# Patient Record
Sex: Female | Born: 1975 | Race: Black or African American | Hispanic: No | Marital: Single | Smoking: Current every day smoker
Health system: Southern US, Community
[De-identification: ages and names within clinical notes are randomized; demographics above are authoritative.]

## PROBLEM LIST (undated history)

## (undated) ENCOUNTER — Emergency Department (HOSPITAL_BASED_OUTPATIENT_CLINIC_OR_DEPARTMENT_OTHER): Admission: EM | Payer: BC Managed Care – PPO | Source: Home / Self Care

## (undated) DIAGNOSIS — I639 Cerebral infarction, unspecified: Secondary | ICD-10-CM

## (undated) DIAGNOSIS — R569 Unspecified convulsions: Secondary | ICD-10-CM

## (undated) DIAGNOSIS — R55 Syncope and collapse: Secondary | ICD-10-CM

## (undated) HISTORY — PX: APPENDECTOMY: SHX54

---

## 2010-02-26 ENCOUNTER — Emergency Department (HOSPITAL_COMMUNITY): Admission: EM | Admit: 2010-02-26 | Discharge: 2010-02-27 | Payer: Self-pay | Admitting: Emergency Medicine

## 2010-02-27 ENCOUNTER — Observation Stay (HOSPITAL_COMMUNITY): Admission: EM | Admit: 2010-02-27 | Discharge: 2010-02-28 | Payer: Self-pay | Admitting: Emergency Medicine

## 2010-02-28 ENCOUNTER — Encounter (INDEPENDENT_AMBULATORY_CARE_PROVIDER_SITE_OTHER): Payer: Self-pay | Admitting: General Surgery

## 2010-08-29 LAB — DIFFERENTIAL
Basophils Absolute: 0 K/uL (ref 0.0–0.1)
Basophils Relative: 0 % (ref 0–1)
Eosinophils Absolute: 0 K/uL (ref 0.0–0.7)
Eosinophils Relative: 0 % (ref 0–5)
Lymphocytes Relative: 21 % (ref 12–46)
Lymphs Abs: 2.5 K/uL (ref 0.7–4.0)
Monocytes Absolute: 1.1 K/uL — ABNORMAL HIGH (ref 0.1–1.0)
Monocytes Relative: 10 % (ref 3–12)
Neutro Abs: 7.9 K/uL — ABNORMAL HIGH (ref 1.7–7.7)
Neutrophils Relative %: 68 % (ref 43–77)

## 2010-08-29 LAB — CBC
HCT: 40.7 % (ref 36.0–46.0)
Hemoglobin: 14 g/dL (ref 12.0–15.0)
MCH: 29.9 pg (ref 26.0–34.0)
MCHC: 34.4 g/dL (ref 30.0–36.0)
MCV: 87 fL (ref 78.0–100.0)
Platelets: 380 K/uL (ref 150–400)
RBC: 4.68 MIL/uL (ref 3.87–5.11)
RDW: 15.2 % (ref 11.5–15.5)
WBC: 11.5 K/uL — ABNORMAL HIGH (ref 4.0–10.5)

## 2010-08-29 LAB — URINALYSIS, ROUTINE W REFLEX MICROSCOPIC
Bilirubin Urine: NEGATIVE
Glucose, UA: NEGATIVE mg/dL
Ketones, ur: NEGATIVE mg/dL
Ketones, ur: NEGATIVE mg/dL
Leukocytes, UA: NEGATIVE
Nitrite: NEGATIVE
Nitrite: NEGATIVE
Protein, ur: NEGATIVE mg/dL
Specific Gravity, Urine: 1.045 — ABNORMAL HIGH (ref 1.005–1.030)
Urobilinogen, UA: 0.2 mg/dL (ref 0.0–1.0)
pH: 6 (ref 5.0–8.0)
pH: 7 (ref 5.0–8.0)

## 2010-08-29 LAB — URINE MICROSCOPIC-ADD ON

## 2010-08-29 LAB — LIPASE, BLOOD: Lipase: 26 U/L (ref 11–59)

## 2010-08-29 LAB — BASIC METABOLIC PANEL
Calcium: 9.1 mg/dL (ref 8.4–10.5)
GFR calc Af Amer: >60 mL/min (ref >60)
GFR calc non Af Amer: >60 mL/min (ref >60)
Glucose, Bld: 80 mg/dL (ref 70–99)
Potassium: 3.5 mEq/L (ref 3.5–5.1)
Sodium: 136 mEq/L (ref 135–145)

## 2010-08-29 LAB — POCT PREGNANCY, URINE
Preg Test, Ur: NEGATIVE
Preg Test, Ur: NEGATIVE

## 2011-06-18 ENCOUNTER — Emergency Department (HOSPITAL_COMMUNITY): Payer: Medicaid Other

## 2011-06-18 ENCOUNTER — Encounter: Payer: Self-pay | Admitting: *Deleted

## 2011-06-18 ENCOUNTER — Other Ambulatory Visit: Payer: Self-pay

## 2011-06-18 ENCOUNTER — Emergency Department (HOSPITAL_COMMUNITY)
Admission: EM | Admit: 2011-06-18 | Discharge: 2011-06-18 | Disposition: A | Discharge status: Home / Self Care | Payer: Medicaid Other | Attending: Emergency Medicine | Admitting: Emergency Medicine

## 2011-06-18 DIAGNOSIS — R002 Palpitations: Secondary | ICD-10-CM | POA: Insufficient documentation

## 2011-06-18 DIAGNOSIS — R05 Cough: Secondary | ICD-10-CM | POA: Insufficient documentation

## 2011-06-18 DIAGNOSIS — R059 Cough, unspecified: Secondary | ICD-10-CM | POA: Insufficient documentation

## 2011-06-18 DIAGNOSIS — F17200 Nicotine dependence, unspecified, uncomplicated: Secondary | ICD-10-CM | POA: Insufficient documentation

## 2011-06-18 DIAGNOSIS — F419 Anxiety disorder, unspecified: Secondary | ICD-10-CM

## 2011-06-18 DIAGNOSIS — R0602 Shortness of breath: Secondary | ICD-10-CM | POA: Insufficient documentation

## 2011-06-18 DIAGNOSIS — R0789 Other chest pain: Secondary | ICD-10-CM | POA: Insufficient documentation

## 2011-06-18 DIAGNOSIS — F411 Generalized anxiety disorder: Secondary | ICD-10-CM | POA: Insufficient documentation

## 2011-06-18 LAB — CBC
HCT: 39.9 % (ref 36.0–46.0)
MCHC: 34.3 g/dL (ref 30.0–36.0)
MCV: 86.7 fL (ref 78.0–100.0)
Platelets: 354 10*3/uL (ref 150–400)
RDW: 14.4 % (ref 11.5–15.5)
WBC: 3.3 10*3/uL — ABNORMAL LOW (ref 4.0–10.5)

## 2011-06-18 LAB — DIFFERENTIAL
Basophils Absolute: 0 10*3/uL (ref 0.0–0.1)
Eosinophils Absolute: 0 10*3/uL (ref 0.0–0.7)
Lymphocytes Relative: 47 % — ABNORMAL HIGH (ref 12–46)
Lymphs Abs: 1.5 10*3/uL (ref 0.7–4.0)
Monocytes Relative: 17 % — ABNORMAL HIGH (ref 3–12)
Neutro Abs: 1.2 10*3/uL — ABNORMAL LOW (ref 1.7–7.7)

## 2011-06-18 LAB — POCT I-STAT, CHEM 8
BUN: 7 mg/dL (ref 6–23)
Calcium, Ion: 1.16 mmol/L (ref 1.12–1.32)
Chloride: 106 meq/L (ref 96–112)
Creatinine, Ser: 0.8 mg/dL (ref 0.50–1.10)
Glucose, Bld: 102 mg/dL — ABNORMAL HIGH (ref 70–99)
HCT: 41 % (ref 36.0–46.0)
Hemoglobin: 13.9 g/dL (ref 12.0–15.0)
Potassium: 3.8 mEq/L (ref 3.5–5.1)
Sodium: 143 meq/L (ref 135–145)
TCO2: 24 mmol/L (ref 0–100)

## 2011-06-18 MED ORDER — LORAZEPAM 1 MG PO TABS
1.0000 mg | ORAL_TABLET | ORAL | Status: AC
  Administered 2011-06-18: 1 mg via ORAL
  Filled 2011-06-18: qty 1

## 2011-06-18 NOTE — ED Notes (Signed)
Pt states "I started coughing x 3 days ago when the cp started, I work in assisted living, and with cold season and all"; pt denies radiating pain

## 2011-06-18 NOTE — ED Provider Notes (Signed)
Medical screening examination/treatment/procedure(s) were performed by non-physician practitioner and as supervising physician I was immediately available for consultation/collaboration. Ellias Mcelreath Y.   Gavin Pound. Kevontae Burgoon, MD 06/18/11 1214

## 2011-06-18 NOTE — ED Notes (Signed)
Pt back from x-ray.

## 2011-06-18 NOTE — ED Notes (Signed)
Patient transported to X-ray 

## 2011-06-18 NOTE — ED Notes (Signed)
Pt given d/c instructions;

## 2011-06-18 NOTE — ED Notes (Signed)
Pt requesting more pain medications, dr. Judd Lien notified.  Pt rates pain 9/10.

## 2011-06-18 NOTE — ED Provider Notes (Signed)
History     CSN: [id number]  Arrival date & time 06/18/11  4098   First MD Initiated Contact with Patient 06/18/11 1014      Chief Complaint  Patient presents with  . Chest Pain  . Shortness of Breath    (Consider location/radiation/quality/duration/timing/severity/associated sxs/prior treatment) Patient is a 36 y.o. female presenting with chest pain. The history is provided by the patient.  Chest Pain The chest pain began 3 - 5 days ago. Chest pain occurs constantly. The chest pain is unchanged. The pain is associated with breathing, coughing and stress. At its most intense, the pain is at 9/10. The pain is currently at 4/10. The severity of the pain is moderate. The quality of the pain is described as pressure-like. The pain does not radiate. Chest pain is worsened by stress and deep breathing. Primary symptoms include shortness of breath, cough and palpitations. Pertinent negatives for primary symptoms include no syncope, no wheezing, no abdominal pain, no nausea and no vomiting.  The palpitations also occurred with shortness of breath.   Pertinent negatives for associated symptoms include no diaphoresis, no lower extremity edema, no near-syncope and no weakness.   Pt states she has been under a lot of stress the last few weeks, and has been crying constantly in the last 4 days. States she feels very depressed. Has not seen a doctor for this problem, has never been treated for depression. States any times she feels a panic attack or anxiety, she develops this chest pain, and shortness of breath. Also states she has been coughing some, no other URI symptoms, has been also lifting lots of heavy things at work. Denies fever, chills, abdominal pain, nausea, vomiting, dizziness, diaphoresis, back pain. Denies recent surgeries or travel.   History reviewed. No pertinent past medical history.  Past Surgical History  Procedure Date  . Appendectomy   . Cesarean section     No family history  on file.  History  Substance Use Topics  . Smoking status: Current Everyday Smoker -- 0.5 packs/day  . Smokeless tobacco: Not on file  . Alcohol Use: No    OB History    Grav Para Term Preterm Abortions TAB SAB Ect Mult Living                  Review of Systems  Constitutional: Negative for diaphoresis.  HENT: Negative for congestion, sore throat, neck pain and neck stiffness.   Eyes: Negative.   Respiratory: Positive for cough, chest tightness and shortness of breath. Negative for wheezing.   Cardiovascular: Positive for palpitations. Negative for syncope and near-syncope.  Gastrointestinal: Negative for nausea, vomiting and abdominal pain.  Genitourinary: Negative.   Musculoskeletal: Negative.   Skin: Negative.   Neurological: Negative.  Negative for weakness.  Psychiatric/Behavioral: Negative.     Allergies  Review of patient's allergies indicates no known allergies.  Home Medications  No current outpatient prescriptions on file.  BP 126/84  Pulse 99  Temp(Src) 98.5 F (36.9 C) (Oral)  Resp 16  Wt 150 lb (68.04 kg)  SpO2 100%  Physical Exam  Nursing note and vitals reviewed. Constitutional: She is oriented to person, place, and time. She appears well-developed and well-nourished. No distress.  HENT:  Head: Normocephalic.  Eyes: Conjunctivae are normal.  Neck: Normal range of motion. Neck supple.  Cardiovascular: Normal rate, regular rhythm and normal heart sounds.   Pulmonary/Chest: Effort normal and breath sounds normal. No respiratory distress. She has no wheezes. She has no rales.  She exhibits no tenderness.  Abdominal: Soft. Bowel sounds are normal. There is no tenderness.  Musculoskeletal: Normal range of motion. She exhibits no edema.  Lymphadenopathy:    She has no cervical adenopathy.  Neurological: She is alert and oriented to person, place, and time.  Skin: Skin is warm and dry.  Psychiatric:       Pt appears anxious, tearful, flat affect     ED Course  Procedures (including critical care time) 10:33 AM Pt tearful, admits to anxiety, stress.  Normal VS. Low risk for ACS. Will get CXR, labs. Ativan ordered. Pt is tachycardic, HR 105 on ECG, CP is pleuritic, pt is a smoker. Will get d-dimer to r/o pe  12:06 PM ECG normal, CXR normal. Labs unremarkable other then large plateleyts, low WBC. Pt does have PCP, instructed to follow up with them for recheck. d-dimer negative.  Pt's anxiety and pain improved with ativan. VS normal.  Will d/c home with follow up.   Labs Reviewed  CBC  DIFFERENTIAL  I-STAT, CHEM 8    Date: 06/18/2011  Rate: 105  Rhythm: sinus tachycardia  QRS Axis: normal  Intervals: normal  ST/T Wave abnormalities: normal  Conduction Disutrbances:none  Narrative Interpretation:   Old EKG Reviewed: none available     MDM   35yo healthy female. CP, anxiety. VS normal. ECG sinus tachycardia, CXR negative, d dimer negative. TIMI 0 doubt ACS. Symptoms resolved with ativan. WIll d/chome with pcp follow up.         Lottie Mussel, PA 06/18/11 [phone]

## 2011-10-22 ENCOUNTER — Emergency Department (HOSPITAL_COMMUNITY): Payer: Medicaid Other

## 2011-10-22 ENCOUNTER — Encounter (HOSPITAL_COMMUNITY): Payer: Self-pay | Admitting: Emergency Medicine

## 2011-10-22 ENCOUNTER — Emergency Department (HOSPITAL_COMMUNITY)
Admission: EM | Admit: 2011-10-22 | Discharge: 2011-10-22 | Disposition: A | Discharge status: Home / Self Care | Payer: Medicaid Other | Attending: Emergency Medicine | Admitting: Emergency Medicine

## 2011-10-22 DIAGNOSIS — R209 Unspecified disturbances of skin sensation: Secondary | ICD-10-CM

## 2011-10-22 DIAGNOSIS — F17200 Nicotine dependence, unspecified, uncomplicated: Secondary | ICD-10-CM | POA: Insufficient documentation

## 2011-10-22 DIAGNOSIS — R202 Paresthesia of skin: Secondary | ICD-10-CM

## 2011-10-22 DIAGNOSIS — R6889 Other general symptoms and signs: Secondary | ICD-10-CM

## 2011-10-22 DIAGNOSIS — I498 Other specified cardiac arrhythmias: Secondary | ICD-10-CM | POA: Insufficient documentation

## 2011-10-22 DIAGNOSIS — R4789 Other speech disturbances: Secondary | ICD-10-CM | POA: Insufficient documentation

## 2011-10-22 LAB — CBC
HCT: 39.4 % (ref 36.0–46.0)
Hemoglobin: 13.7 g/dL (ref 12.0–15.0)
MCH: 30.2 pg (ref 26.0–34.0)
MCHC: 34.8 g/dL (ref 30.0–36.0)
MCV: 87 fL (ref 78.0–100.0)
RDW: 14.7 % (ref 11.5–15.5)

## 2011-10-22 LAB — COMPREHENSIVE METABOLIC PANEL
Albumin: 3.9 g/dL (ref 3.5–5.2)
BUN: 13 mg/dL (ref 6–23)
Calcium: 9.5 mg/dL (ref 8.4–10.5)
Chloride: 104 mEq/L (ref 96–112)
Creatinine, Ser: 0.69 mg/dL (ref 0.50–1.10)
Total Bilirubin: 0.4 mg/dL (ref 0.3–1.2)
Total Protein: 7.4 g/dL (ref 6.0–8.3)

## 2011-10-22 LAB — DIFFERENTIAL
Basophils Absolute: 0 10*3/uL (ref 0.0–0.1)
Basophils Relative: 0 % (ref 0–1)
Eosinophils Absolute: 0.2 10*3/uL (ref 0.0–0.7)
Eosinophils Relative: 2 % (ref 0–5)
Monocytes Absolute: 0.5 10*3/uL (ref 0.1–1.0)
Monocytes Relative: 7 % (ref 3–12)

## 2011-10-22 LAB — CK TOTAL AND CKMB (NOT AT ARMC)
CK, MB: 2.5 ng/mL (ref 0.3–4.0)
Total CK: 224 U/L — ABNORMAL HIGH (ref 7–177)

## 2011-10-22 LAB — POCT I-STAT, CHEM 8
BUN: 14 mg/dL (ref 6–23)
Creatinine, Ser: 0.8 mg/dL (ref 0.50–1.10)
Glucose, Bld: 82 mg/dL (ref 70–99)
Hemoglobin: 14.3 g/dL (ref 12.0–15.0)
Potassium: 3.7 mEq/L (ref 3.5–5.1)
Sodium: 143 mEq/L (ref 135–145)
TCO2: 23 mmol/L (ref 0–100)

## 2011-10-22 NOTE — ED Notes (Signed)
Pt asking to leave but family convinced pt to stay.  Apologized for delays and explained importance of pt staying for continued evaluation.  Pt agreed to stay at this time.

## 2011-10-22 NOTE — Code Documentation (Addendum)
36 year old presents to ED as Code Stroke.  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  Code stroke called at 1401 with ETA of 10 mins.  Patient arrived to ED at 1408.  Seen by EDP at 1409.  To CT scan at 1411.  LSW was 1330.  NIHSS 01 - missed month.  EMS reports some left hand weakness and slurred speech - cleared on arrival.  Patient denies headache. CBG 99. At one point during exam patient became tearful and said I just want to go home.  Dr. Elita Boone present.  Code stroke cancelled.  Handoff report to Amy, RN in ED.

## 2011-10-22 NOTE — ED Notes (Signed)
Neuro at bedside for evaluation.  Dr. Clarene Duke at bedside for evaluation.

## 2011-10-22 NOTE — Discharge Instructions (Signed)
RESOURCE GUIDE  Dental Problems  Patients with Medicaid: [facility]                     [facility] [address]                                           [address] Phone:  [phone]                                                  Phone:  [phone]  If unable to pay or uninsured, contact:  [facility] or [facility] to become qualified for the adult dental clinic.  Chronic Pain Problems Contact [facility]  [phone] Patients need to be referred by their primary care doctor.  Insufficient Money for Medicine Contact [employer]:  call "211" or [facility] 271-5999.  No Primary Care Doctor Call [facility]  [phone] Other agencies that provide inexpensive medical care    [facility]  [phone]    [facility]  [phone]    [facility]  [phone]    [facility] Clinic  [phone]    [facility]  [phone]    [facility]  [phone]  Psychological Services [facility]  [phone] [facility]  [phone] [facility]   [phone] (emergency services [phone])  Substance Abuse Resources [facility]  336-882-2125 [facility] 336-784-9470 [facility] 336-285-9073 [facility] 336-845-3988 Residential & Outpatient Substance Abuse Program  800-659-3381  Abuse/Neglect [place] Child Abuse Hotline (336) 641-3795 [place] Child Abuse Hotline 800-378-5315 (After Hours)  Emergency Shelter [facility] (336) 271-5985  [facility] (336) 275-9566 [facility] (704) 372-4663  MRSA Hotline #:   832-7006    [place] [facility]  [facility] [place]     [employer]                          [facility] [address] [place]                       [address]      [address]                                                  [place]                            [place] Phone:  [phone]                                   Phone:  [phone]                 Phone:  [phone]  [facility] Phone:  [phone]    [place] Child Abuse Hotline (336) 342-1394 (336) 342-3537 (After Hours)    Take your usual prescriptions as previously directed.  Call your regular medical doctor today to schedule a follow up appointment this week.  Return to the Emergency Department immediately sooner if worsening.   

## 2011-10-22 NOTE — Consult Note (Signed)
Reason for Consult:  Response to Code Stroke Referring Physician:  Dr Ferman Hamming  CC: Paresthesia of lower face and left arm   HPI: Penny Berger is an 36 y.o. African=Americanfemale who, while at work noticed a paresthesia of her left arm decendiongfrom the shoulder followed quickly by a lower face (approximate V3 distribution) paresthesia.  She had eaten lunch about 1 hr before onset of symptoms.  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  A Code Stroke was call and I responded along with the stroke team.  She denies any significant medical hx except for a C section and an incidental AP.    History reviewed. No pertinent past medical history.  Past Surgical History  Procedure Date  . Appendectomy   . Cesarean section     History reviewed. No pertinent family history.Specically the mother and father have no hx of siezures or headache.  Social History: Single mother, 1 each 42 yo son in good health.  No Known Allergies  Medications: On no daily medications.  ROS *History obtained from patient.  General ROS: negative for - chills, fatigue, fever, night sweats, weight gain or weight loss Psychological ROS: negative for - behavioral disorder, hallucinations, memory difficulties, mood swings or suicidal ideation Ophthalmic ROS: negative for - blurry vision, double vision, eye pain or loss of vision ENT ROS: negative for - epistaxis, nasal discharge, oral lesions, sore throat, tinnitus or vertigo Allergy and Immunology ROS: negative for - hives or itchy/watery eyes Hematological and Lymphatic ROS: negative for - bleeding problems, bruising or swollen lymph nodes Endocrine ROS: negative for - galactorrhea, hair pattern changes, polydipsia/polyuria or temperature intolerance Respiratory ROS: negative for - cough, hemoptysis, shortness of breath or wheezing Cardiovascular ROS: negative for - chest pain, dyspnea on exertion, edema or irregular  heartbeat Gastrointestinal ROS: negative for - abdominal pain, diarrhea, hematemesis, nausea/vomiting or stool incontinence Genito-Urinary ROS: negative for - dysuria, hematuria, incontinence or urinary frequency/urgency Musculoskeletal ROS: negative for - joint swelling or muscular weakness Neurological ROS: as noted in HPI Dermatological ROS: negative for rash and skin lesion changes  Physical Examination: Blood pressure 123/86, pulse 102, temperature 99.1 F (37.3 C), temperature source Oral, resp. rate 22, SpO2 98.00%.  Neurologic Examination *Mental Status: Alert, oriented, thought content appropriate.  Speech fluent without evidence of aphasia.  Able to follow 3 step commands without difficulty. Cranial Nerves: II: visual fields grossly normal, pupils equal, round, reactive to light and accommodation III,IV, VI: ptosis not present, extra-ocular motions intact bilaterally V,VII: smile symmetric, facial light touch sensation normal bilaterally VIII: hearing normal bilaterally to speech IX,X: gag reflex no performed XI: trapezius strength/neck flexion strength normal bilaterally XII: tongue strength normal  Motor: Right : Upper extremity   5/5     Left:     Upper extremity   5/5 Right Lower extremity   5/5     Left      Lower extremity   5/5 Tone and bulk:normal tone throughout; no atrophy noted Sensory:  Llight touch intact throughout, bilaterally Deep Tendon Reflexes: 2+ and symmetric throughout Plantars: Right: downgoing   Left: downgoing Cerebellar: normal finger-to-nose,  gait and station not tested.     Results for orders placed during the hospital encounter of 10/22/11 (from the past 48 hour(s))  PROTIME-INR     Status: Normal   Collection Time   10/22/11  2:25 PM      Component Value Range Comment  Prothrombin Time 12.8  11.6 - 15.2 (seconds)    INR 0.94  0.00 - 1.49    APTT     Status: Normal   Collection Time   10/22/11  2:25 PM      Component Value Range  Comment   aPTT 25  24 - 37 (seconds)   CBC     Status: Normal   Collection Time   10/22/11  2:25 PM      Component Value Range Comment   WBC 7.1  4.0 - 10.5 (K/uL)    RBC 4.53  3.87 - 5.11 (MIL/uL)    Hemoglobin 13.7  12.0 - 15.0 (g/dL)    HCT 40.9  81.1 - 91.4 (%)    MCV 87.0  78.0 - 100.0 (fL)    MCH 30.2  26.0 - 34.0 (pg)    MCHC 34.8  30.0 - 36.0 (g/dL)    RDW 78.2  95.6 - 21.3 (%)    Platelets 331  150 - 400 (K/uL)   DIFFERENTIAL     Status: Normal   Collection Time   10/22/11  2:25 PM      Component Value Range Comment   Neutrophils Relative 44  43 - 77 (%)    Neutro Abs 3.1  1.7 - 7.7 (K/uL)    Lymphocytes Relative 46  12 - 46 (%)    Lymphs Abs 3.3  0.7 - 4.0 (K/uL)    Monocytes Relative 7  3 - 12 (%)    Monocytes Absolute 0.5  0.1 - 1.0 (K/uL)    Eosinophils Relative 2  0 - 5 (%)    Eosinophils Absolute 0.2  0.0 - 0.7 (K/uL)    Basophils Relative 0  0 - 1 (%)    Basophils Absolute 0.0  0.0 - 0.1 (K/uL)   POCT I-STAT, CHEM 8     Status: Normal   Collection Time   10/22/11  2:30 PM      Component Value Range Comment   Sodium 143  135 - 145 (mEq/L)    Potassium 3.7  3.5 - 5.1 (mEq/L)    Chloride 109  96 - 112 (mEq/L)    BUN 14  6 - 23 (mg/dL)    Creatinine, Ser 0.86  0.50 - 1.10 (mg/dL)    Glucose, Bld 82  70 - 99 (mg/dL)    Calcium, Ion 5.78  1.12 - 1.32 (mmol/L)    TCO2 23  0 - 100 (mmol/L)    Hemoglobin 14.3  12.0 - 15.0 (g/dL)    HCT 46.9  62.9 - 52.8 (%)     No results found for this or any previous visit (from the past 240 hour(s)).  Ct Head Wo Contrast  10/22/2011  *RADIOLOGY REPORT*  Clinical Data: Sudden onset of left-sided weakness.  CT HEAD WITHOUT CONTRAST  Technique:  Contiguous axial images were obtained from the base of the skull through the vertex without contrast.  Comparison: None.  Findings: Mild artifact by hair clips.  No intracranial hemorrhage.  No CT evidence of large acute infarct.  No intracranial mass lesion detected on this unenhanced exam.   No hydrocephalus.  IMPRESSION: No intracranial hemorrhage or CT evidence of large acute infarct. Evaluation slightly limited for detection of subtle infarct (caused by patient's hair clips).  Results called to Dr. Clarene Duke 10/22/2011 2:56 p.m.  No return call from the neuro beeper (2:54 p.m.).  Original Report Authenticated By: Fuller Canada, M.D.     Assessment/Plan:  Patient Active  Hospital Problem List: No active hospital problems.  Pt had normal neuro exam, nl CT of head and all symptoms had resolved.  Dx: paresthesiae of unknown etiology.  Plan:  Discharge home with FU by her PCP.   10/22/2011, 3:28 PM

## 2011-10-22 NOTE — ED Provider Notes (Signed)
History     CSN: [id number]  Arrival date & time 10/22/11  1411   First MD Initiated Contact with Patient 10/22/11 1416      Chief Complaint  Patient presents with  . Code Stroke    HPI Pt was seen at 1425.  Per EMS and pt report, c/o gradual onset and resolution of one episode of slurred speech, "numbness" to her left hand/fingers, as well as "numbness" and "quivering" of her chin area.  Was also associated with feeling generally weak and near syncopal.  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  EMS called code stroke en route.  Pt currently improved.  Denies CP/palpitations, no SOB/cough, no abd pain, no N/V/D, no visual changes, no focal motor weakness.   History reviewed. No pertinent past medical history.  Past Surgical History  Procedure Date  . Appendectomy   . Cesarean section     History  Substance Use Topics  . Smoking status: Current Everyday Smoker -- 0.5 packs/day  . Smokeless tobacco: Not on file  . Alcohol Use: Yes    Review of Systems ROS: Statement: All systems negative except as marked or noted in the HPI; Constitutional: Negative for fever and chills. ; ; Eyes: Negative for eye pain, redness and discharge. ; ; ENMT: Negative for ear pain, hoarseness, nasal congestion, sinus pressure and sore throat. ; ; Cardiovascular: Negative for chest pain, palpitations, diaphoresis, dyspnea and peripheral edema. ; ; Respiratory: Negative for cough, wheezing and stridor. ; ; Gastrointestinal: Negative for nausea, vomiting, diarrhea, abdominal pain, blood in stool, hematemesis, jaundice and rectal bleeding. . ; ; Genitourinary: Negative for dysuria, flank pain and hematuria. ; ; Musculoskeletal: Negative for back pain and neck pain. Negative for swelling and trauma.; ; Skin: Negative for pruritus, rash, abrasions, blisters, bruising and skin lesion.; ; Neuro: +slurred speech, paresthesias, generalized weakness. Negative for headache, lightheadedness and  neck stiffness. Negative for altered level of consciousness , altered mental status, extremity weakness, paresthesias, involuntary movement, seizure and syncope.     Allergies  Review of patient's allergies indicates no known allergies.  Home Medications  No current outpatient prescriptions on file.  BP 123/86  Pulse 102  Temp(Src) 99.1 F (37.3 C) (Oral)  Resp 22  SpO2 98%  Physical Exam 1430: Physical examination:  Nursing notes reviewed; Vital signs and O2 SAT reviewed;  Constitutional: Well developed, Well nourished, Well hydrated, In no acute distress; Head:  Normocephalic, atraumatic; Eyes: EOMI, PERRL, No scleral icterus; ENMT: Mouth and pharynx normal, Mucous membranes moist; Neck: Supple, Full range of motion, No lymphadenopathy; Cardiovascular: Regular rate and rhythm, No murmur, rub, or gallop; Respiratory: Breath sounds clear & equal bilaterally, No rales, rhonchi, wheezes, or rub, Normal respiratory effort/excursion; Chest: Nontender, Movement normal; Abdomen: Soft, Nontender, Nondistended, Normal bowel sounds; Genitourinary: No CVA tenderness; Extremities: Pulses normal, No tenderness, No edema, No calf edema or asymmetry.; Neuro: AA&Ox3, Major CN grossly intact. Speech clear, no facial droop, No gross sensory deficits.  Normal cerebellar testing bilat UE's and LE's.  No gross focal motor or sensory deficits in extremities.; Skin: Color normal, Warm, Dry, no rash.    ED Course  Procedures  1435:  Neuro MD Elita Boone at bedside: for eval, NIH score 1 (missed month), all symptoms resolved, code stroke cancelled by Neuro MD, states pt can be discharged home, he will type consult.    3:59 PM:   Pt wants to leave now.  She is walking around the  exam room, steady gait easy resps.  Neuro has dictated consult, recommends d/c and f/u with PMD.  Dx testing d/w pt and family.  Questions answered.  Verb understanding, agreeable to d/c home with outpt f/u.   MDM  MDM Reviewed: nursing note  and vitals Interpretation: ECG, labs and CT scan      Date: 10/22/2011  Rate: 100  Rhythm: sinus tachycardia  QRS Axis: normal  Intervals: normal  ST/T Wave abnormalities: normal  Conduction Disutrbances:none  Narrative Interpretation:   Old EKG Reviewed: none available.  Results for orders placed during the hospital encounter of 10/22/11  [facility]      Component Value Range   Prothrombin Time 12.8  11.6 - 15.2 (seconds)   INR 0.94  0.00 - 1.49   APTT      Component Value Range   aPTT 25  24 - 37 (seconds)  CBC      Component Value Range   WBC 7.1  4.0 - 10.5 (K/uL)   RBC 4.53  3.87 - 5.11 (MIL/uL)   Hemoglobin 13.7  12.0 - 15.0 (g/dL)   HCT 40.9  81.1 - 91.4 (%)   MCV 87.0  78.0 - 100.0 (fL)   MCH 30.2  26.0 - 34.0 (pg)   MCHC 34.8  30.0 - 36.0 (g/dL)   RDW 78.2  95.6 - 21.3 (%)   Platelets 331  150 - 400 (K/uL)  DIFFERENTIAL      Component Value Range   Neutrophils Relative 44  43 - 77 (%)   Neutro Abs 3.1  1.7 - 7.7 (K/uL)   Lymphocytes Relative 46  12 - 46 (%)   Lymphs Abs 3.3  0.7 - 4.0 (K/uL)   Monocytes Relative 7  3 - 12 (%)   Monocytes Absolute 0.5  0.1 - 1.0 (K/uL)   Eosinophils Relative 2  0 - 5 (%)   Eosinophils Absolute 0.2  0.0 - 0.7 (K/uL)   Basophils Relative 0  0 - 1 (%)   Basophils Absolute 0.0  0.0 - 0.1 (K/uL)  COMPREHENSIVE METABOLIC PANEL      Component Value Range   Sodium 138  135 - 145 (mEq/L)   Potassium 3.9  3.5 - 5.1 (mEq/L)   Chloride 104  96 - 112 (mEq/L)   CO2 21  19 - 32 (mEq/L)   Glucose, Bld 78  70 - 99 (mg/dL)   BUN 13  6 - 23 (mg/dL)   Creatinine, Ser 0.86  0.50 - 1.10 (mg/dL)   Calcium 9.5  8.4 - 57.8 (mg/dL)   Total Protein 7.4  6.0 - 8.3 (g/dL)   Albumin 3.9  3.5 - 5.2 (g/dL)   AST 20  0 - 37 (U/L)   ALT 15  0 - 35 (U/L)   Alkaline Phosphatase 46  39 - 117 (U/L)   Total Bilirubin 0.4  0.3 - 1.2 (mg/dL)   GFR calc non Af Amer >90  >90 (mL/min)   GFR calc Af Amer >90  >90 (mL/min)  CK TOTAL AND CKMB       Component Value Range   Total CK 224 (*) 7 - 177 (U/L)   CK, MB 2.5  0.3 - 4.0 (ng/mL)   Relative Index 1.1  0.0 - 2.5   TROPONIN I      Component Value Range   Troponin I <0.30  <0.30 (ng/mL)  POCT I-STAT, CHEM 8      Component Value Range   Sodium 143  135 -  145 (mEq/L)   Potassium 3.7  3.5 - 5.1 (mEq/L)   Chloride 109  96 - 112 (mEq/L)   BUN 14  6 - 23 (mg/dL)   Creatinine, Ser 1.61  0.50 - 1.10 (mg/dL)   Glucose, Bld 82  70 - 99 (mg/dL)   Calcium, Ion 0.96  0.45 - 1.32 (mmol/L)   TCO2 23  0 - 100 (mmol/L)   Hemoglobin 14.3  12.0 - 15.0 (g/dL)   HCT 40.9  81.1 - 91.4 (%)   Ct Head Wo Contrast 10/22/2011  *RADIOLOGY REPORT*  Clinical Data: Sudden onset of left-sided weakness.  CT HEAD WITHOUT CONTRAST  Technique:  Contiguous axial images were obtained from the base of the skull through the vertex without contrast.  Comparison: None.  Findings: Mild artifact by hair clips.  No intracranial hemorrhage.  No CT evidence of large acute infarct.  No intracranial mass lesion detected on this unenhanced exam.  No hydrocephalus.  IMPRESSION: No intracranial hemorrhage or CT evidence of large acute infarct. Evaluation slightly limited for detection of subtle infarct (caused by patient's hair clips).  Results called to Dr. Clarene Duke 10/22/2011 2:56 p.m.  No return call from the neuro beeper (2:54 p.m.).  Original Report Authenticated By: Fuller Canada, M.D.             Laray Anger, DO 10/24/11 2323

## 2011-10-22 NOTE — ED Notes (Signed)
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 Pt c/o numbness to chin and left arm. Pt also felt weak, dizzy and was near syncope at the time. ?right sided facial droop. Negative arm drift. Denies n/v/SOB/ CP. 18g LAC

## 2011-10-22 NOTE — ED Notes (Addendum)
Patient stating she wants to leave AMA.  Patient states "since there is nothing wrong, i don't want to be here".  Report given and care transferred to [facility], RN.  AMA form will be taken to patient to sign.  She did agree to sign.

## 2011-10-26 ENCOUNTER — Emergency Department (HOSPITAL_COMMUNITY)
Admission: EM | Admit: 2011-10-26 | Discharge: 2011-10-26 | Disposition: A | Discharge status: Home / Self Care | Payer: Medicaid Other | Attending: Emergency Medicine | Admitting: Emergency Medicine

## 2011-10-26 DIAGNOSIS — R55 Syncope and collapse: Secondary | ICD-10-CM | POA: Insufficient documentation

## 2011-10-26 MED ORDER — ONDANSETRON HCL 4 MG/2ML IJ SOLN
INTRAMUSCULAR | Status: AC
  Filled 2011-10-26: qty 2

## 2011-10-26 MED ORDER — SODIUM CHLORIDE 0.9 % IV BOLUS (SEPSIS): 1000.0000 mL | Freq: Once | INTRAVENOUS | Status: DC

## 2011-10-26 NOTE — ED Notes (Signed)
Pt brought in by EMS Pt was at church, and it was very hot (per EMS) had a syncopal episode family stated she blacked.Pt was seen at [facility] on 10/22/11 for r/o TIA

## 2011-10-26 NOTE — ED Notes (Signed)
ZOX:[id number]<AV> Expected date:<BR> Expected time:12:26 PM<BR> Means of arrival:Ambulance<BR> Comments:<BR> M31 -- Syncopal Episode

## 2011-10-26 NOTE — ED Notes (Signed)
Pt refused to have blood work done at this time.  She had previous blood work done at her doctor and [facility] within the week.  Pt didn't think its necessary to have it done again.

## 2011-10-26 NOTE — ED Provider Notes (Signed)
History     CSN: [id number]  Arrival date & time 10/26/11  1251   First MD Initiated Contact with Patient 10/26/11 1317      Chief Complaint  Patient presents with  . Near Syncope    (Consider location/radiation/quality/duration/timing/severity/associated sxs/prior treatment) HPI  Patient presents to the ED with complaints of near syncope  oday at church she went from sitting down at the pews and when all of mothers were asked to stand up she felt faint, weak and almost passed out. By standers say that she was briefly disoriented < 1 minute. She has no continuing deficits. She admits to feeling a little weird during church but denies SOB, CP, abdominal pain, headache, blurry vision. She states that she feels fine now and would like to go home. Pt is acting appropriately and family is requesting to take her home. Pt was evaluated at [facility] within the past week for suspected TIA.  No past medical history on file.  Past Surgical History  Procedure Date  . Appendectomy   . Cesarean section     No family history on file.  History  Substance Use Topics  . Smoking status: Current Everyday Smoker -- 0.5 packs/day  . Smokeless tobacco: Not on file  . Alcohol Use: Yes    OB History    Grav Para Term Preterm Abortions TAB SAB Ect Mult Living                  Review of Systems   HEENT: denies blurry vision or change in hearing PULMONARY: Denies difficulty breathing and SOB CARDIAC: denies chest pain or heart palpitations MUSCULOSKELETAL:  denies being unable to ambulate ABDOMEN AL: denies abdominal pain GU: denies loss of bowel or urinary control NEURO: denies numbness and tingling in extremities   Allergies  Review of patient's allergies indicates no known allergies.  Home Medications  No current outpatient prescriptions on file.  BP 123/78  Pulse 74  Temp(Src) 98.1 F (36.7 C) (Oral)  Resp 16  SpO2 98%  Physical Exam  Nursing note and vitals  reviewed. Constitutional: She is oriented to person, place, and time. She appears well-developed and well-nourished. No distress.  HENT:  Head: Normocephalic and atraumatic.  Eyes: Pupils are equal, round, and reactive to light.  Neck: Normal range of motion. Neck supple.  Cardiovascular: Normal rate and regular rhythm.   Pulmonary/Chest: Effort normal.  Abdominal: Soft.  Neurological: She is oriented to person, place, and time. She has normal strength. No cranial nerve deficit (3-12 intact) or sensory deficit. She displays a negative Romberg sign.  Skin: Skin is warm and dry.    ED Course  Procedures (including critical care time)   Labs Reviewed  TROPONIN I  CBC  BASIC METABOLIC PANEL   No results found.   1. Near syncope       MDM    Date: 10/26/2011  Rate: 78  Rhythm: normal sinus rhythm  QRS Axis: normal  Intervals: normal  ST/T Wave abnormalities: normal  Conduction Disutrbances:none  Narrative Interpretation:   Old EKG Reviewed: unchanged from Oct 22, 2011   Patient states that she has had blood work done twice within the past week. [facility] and [facility] for the same complaint. She has seen her PCP dr. Daphine Deutscher since then and an MRI has been ordered for this Tuesday. Extensive blood work including lipids have been drawn and working with a Data processing manager. It is suspected that she may have had  a TIA in the past. Today at church she went from sitting down at the pews and when all of mothers were asked to stand up she felt faint, weak and almost passed out. By standers say that she was briefly disoriented < 1 minute. She has no remaining deficits.    The patient refuses to have blood work done due to have had blood work done already this week. She states she feels fine and back to normal and is ready to leave. Her EKG is okay, patient has a benign exam with no neurological systemic of focal deficits. Pt ambulated by myself in room with no difficulties. Will  discharge and have her follow-up with her PCP and appointments coming up this week. I   Pt has been advised of the symptoms that warrant their return to the ED. Patient has voiced understanding and has agreed to follow-up with the PCP or specialist.       Dorthula Matas, PA 10/26/11 1446

## 2011-10-26 NOTE — Discharge Instructions (Signed)
Near-Syncope Near-syncope is sudden weakness, dizziness, or feeling like you might pass out (faint). This may occur when getting up after sitting or while standing for a long period of time. Near-syncope can be caused by a drop in blood pressure. This is a common reaction, but it may occur to a greater degree in people taking medicines to control their blood pressure. Fainting often occurs when the blood pressure or pulse is too low to provide enough blood flow to the brain to keep you conscious. Fainting and near-syncope are not usually due to serious medical problems. However, certain people should be more cautious in the event of near-syncope, including elderly patients, patients with diabetes, and patients with a history of heart conditions (especially irregular rhythms).  CAUSES   Drop in blood pressure.   Physical pain.   Dehydration.   Heat exhaustion.   Emotional distress.   Low blood sugar.   Internal bleeding.   Heart and circulatory problems.   Infections.  SYMPTOMS   Dizziness.   Feeling sick to your stomach (nauseous).   Nearly fainting.   Body numbness.   Turning pale.   Tunnel vision.   Weakness.  HOME CARE INSTRUCTIONS   Lie down right away if you start feeling like you might faint. Breathe deeply and steadily. Wait until all the symptoms have passed. Most of these episodes last only a few minutes. You may feel tired for several hours.   Drink enough fluids to keep your urine clear or pale yellow.   If you are taking blood pressure or heart medicine, get up slowly, taking several minutes to sit and then stand. This can reduce dizziness that is caused by a drop in blood pressure.  SEEK IMMEDIATE MEDICAL CARE IF:   You have a severe headache.   Unusual pain develops in the chest, abdomen, or back.   There is bleeding from the mouth or rectum, or you have black or tarry stool.   An irregular heartbeat or a very rapid pulse develops.   You have  repeated fainting or seizure-like jerking during an episode.   You faint when sitting or lying down.   You develop confusion.   You have difficulty walking.   Severe weakness develops.   Vision problems develop.  MAKE SURE YOU:   Understand these instructions.   Will watch your condition.   Will get help right away if you are not doing well or get worse.  Document Released: 06/02/2005 Document Revised: 05/22/2011 Document Reviewed: 07/19/2010 [employer] Patient Information 2012 [employer]. 

## 2011-10-26 NOTE — ED Notes (Signed)
Pt refusing blood draw.

## 2011-10-30 NOTE — ED Provider Notes (Signed)
Medical screening examination/treatment/procedure(s) were performed by non-physician practitioner and as supervising physician I was immediately available for consultation/collaboration.  Raeford Razor, MD 10/30/11 [phone]

## 2011-11-06 ENCOUNTER — Emergency Department (HOSPITAL_COMMUNITY)
Admission: EM | Admit: 2011-11-06 | Discharge: 2011-11-07 | Disposition: A | Discharge status: Home / Self Care | Payer: Medicaid Other | Attending: Emergency Medicine | Admitting: Emergency Medicine

## 2011-11-06 ENCOUNTER — Encounter (HOSPITAL_COMMUNITY): Payer: Self-pay | Admitting: Emergency Medicine

## 2011-11-06 DIAGNOSIS — G40909 Epilepsy, unspecified, not intractable, without status epilepticus: Secondary | ICD-10-CM

## 2011-11-06 DIAGNOSIS — R569 Unspecified convulsions: Secondary | ICD-10-CM | POA: Insufficient documentation

## 2011-11-06 DIAGNOSIS — F17200 Nicotine dependence, unspecified, uncomplicated: Secondary | ICD-10-CM | POA: Insufficient documentation

## 2011-11-06 DIAGNOSIS — Z79899 Other long term (current) drug therapy: Secondary | ICD-10-CM | POA: Insufficient documentation

## 2011-11-06 DIAGNOSIS — N390 Urinary tract infection, site not specified: Secondary | ICD-10-CM | POA: Insufficient documentation

## 2011-11-06 HISTORY — DX: Unspecified convulsions: R56.9

## 2011-11-06 HISTORY — DX: Syncope and collapse: R55

## 2011-11-06 NOTE — ED Notes (Signed)
ZOX:[id number]<AV> Expected date:11/06/11<BR> Expected time:<BR> Means of arrival:<BR> Comments:<BR> EMS [facility] - seizure

## 2011-11-06 NOTE — ED Notes (Signed)
Pt wheelchaired to bathroom.  Pt family staying with patient for safety.

## 2011-11-06 NOTE — ED Notes (Signed)
Pt. With history of seizures started having them last Tuesday, then today had 4 seizures the last one lasting for 1 and a half minutes.  No incontinence or oral trauma noted.  EMS reports pt. Will follow commands but will not speak.  Pt. Was started on Topamax last week.

## 2011-11-07 LAB — URINE MICROSCOPIC-ADD ON

## 2011-11-07 LAB — CBC
Hemoglobin: 13.3 g/dL (ref 12.0–15.0)
MCH: 30.2 pg (ref 26.0–34.0)
MCHC: 33.9 g/dL (ref 30.0–36.0)
MCV: 88.9 fL (ref 78.0–100.0)
RBC: 4.41 MIL/uL (ref 3.87–5.11)

## 2011-11-07 LAB — DIFFERENTIAL
Basophils Relative: 1 % (ref 0–1)
Eosinophils Absolute: 0.2 10*3/uL (ref 0.0–0.7)
Eosinophils Relative: 4 % (ref 0–5)
Lymphs Abs: 2.2 10*3/uL (ref 0.7–4.0)
Monocytes Absolute: 0.5 10*3/uL (ref 0.1–1.0)
Monocytes Relative: 10 % (ref 3–12)
Neutrophils Relative %: 43 % (ref 43–77)

## 2011-11-07 LAB — URINALYSIS, ROUTINE W REFLEX MICROSCOPIC
Bilirubin Urine: NEGATIVE
Hgb urine dipstick: NEGATIVE
Ketones, ur: NEGATIVE mg/dL
Nitrite: NEGATIVE
Protein, ur: NEGATIVE mg/dL
Specific Gravity, Urine: 1.02 (ref 1.005–1.030)
Urobilinogen, UA: 1 mg/dL (ref 0.0–1.0)

## 2011-11-07 LAB — BASIC METABOLIC PANEL
BUN: 12 mg/dL (ref 6–23)
Calcium: 8.8 mg/dL (ref 8.4–10.5)
Creatinine, Ser: 0.81 mg/dL (ref 0.50–1.10)
GFR calc Af Amer: >90 mL/min (ref >90)
GFR calc non Af Amer: >90 mL/min (ref >90)
Glucose, Bld: 116 mg/dL — ABNORMAL HIGH (ref 70–99)

## 2011-11-07 MED ORDER — SULFAMETHOXAZOLE-TRIMETHOPRIM 800-160 MG PO TABS: 1.0000 | ORAL_TABLET | Freq: Two times a day (BID) | ORAL | Status: AC

## 2011-11-07 MED ORDER — SODIUM CHLORIDE 0.9 % IV BOLUS (SEPSIS)
1000.0000 mL | Freq: Once | INTRAVENOUS | Status: AC
  Administered 2011-11-07: 1000 mL via INTRAVENOUS

## 2011-11-07 MED ORDER — LEVETIRACETAM 500 MG PO TABS: 500.0000 mg | ORAL_TABLET | Freq: Two times a day (BID) | ORAL | Status: DC

## 2011-11-07 NOTE — Discharge Instructions (Signed)
Driving and Equipment Restrictions Some medical problems make it dangerous to drive, ride a bike, or use machines. Some of these problems are:  A hard blow to the head (concussion).   Passing out (fainting).   Twitching and shaking (seizures).   Low blood sugar.   Taking medicine to help you relax (sedatives).   Taking pain medicines.   Wearing an eye patch.   Wearing splints. This can make it hard to use parts of your body that you need to drive safely.  HOME CARE   Do not drive until your doctor says it is okay.   Do not use machines until your doctor says it is okay.  You may need a form signed by your doctor (medical release) before you can drive again. You may also need this form before you do other tasks where you need to be fully alert. MAKE SURE YOU:  Understand these instructions.   Will watch your condition.   Will get help right away if you are not doing well or get worse.  Document Released: 07/10/2004 Document Revised: 05/22/2011 Document Reviewed: 10/10/2009 [employer] Patient Information 2012 [employer]. 

## 2011-11-07 NOTE — ED Provider Notes (Signed)
History     CSN: [id number]  Arrival date & time 11/06/11  2303   First MD Initiated Contact with Patient 11/06/11 2338      Chief Complaint  Patient presents with  . Seizures    (Consider location/radiation/quality/duration/timing/severity/associated sxs/prior treatment) HPI Pt with 2 weeks of syncope/seizure-like symptoms and thus far negative workup. Has had CT, MRI and EEG. Seen by neurologist and started on Topamax. Pt had witnessed tonic-clonic seizure today with 15-20 min post ictal phase. No incontinence or tongue biting. In ED pt is at baseline mental status. C/o no pain, weakness, numbness, HA.  Past Medical History  Diagnosis Date  . Seizures   . Syncope     Past Surgical History  Procedure Date  . Appendectomy   . Cesarean section     No family history on file.  History  Substance Use Topics  . Smoking status: Current Everyday Smoker -- 0.5 packs/day  . Smokeless tobacco: Not on file  . Alcohol Use: Yes    OB History    Grav Para Term Preterm Abortions TAB SAB Ect Mult Living                  Review of Systems  Constitutional: Negative for chills.  HENT: Negative for neck pain and neck stiffness.   Eyes: Negative for photophobia.  Respiratory: Negative for shortness of breath.   Cardiovascular: Negative for chest pain and palpitations.  Gastrointestinal: Negative for nausea, vomiting and abdominal pain.  Genitourinary: Negative for dysuria and frequency.  Skin: Negative for rash and wound.  Neurological: Positive for seizures and syncope. Negative for dizziness, weakness, light-headedness, numbness and headaches.    Allergies  Review of patient's allergies indicates no known allergies.  Home Medications   Current Outpatient Rx  Name Route Sig Dispense Refill  . ALPRAZOLAM 0.5 MG PO TABS Oral Take 0.5 mg by mouth 3 (three) times daily as needed. For anxiety    . HYDROCODONE-ACETAMINOPHEN 5-500 MG PO TABS Oral Take 1 tablet by mouth every 4  (four) hours as needed.    Marland Kitchen MEDROXYPROGESTERONE ACETATE 150 MG/ML IM SUSP Intramuscular Inject 150 mg into the muscle every 3 (three) months.    . TOPIRAMATE 25 MG PO TABS Oral Take 25 mg by mouth daily.    Marland Kitchen LEVETIRACETAM 500 MG PO TABS Oral Take 1 tablet (500 mg total) by mouth every 12 (twelve) hours. 60 tablet 0  . SULFAMETHOXAZOLE-TRIMETHOPRIM 800-160 MG PO TABS Oral Take 1 tablet by mouth 2 (two) times daily. 10 tablet 0    BP 109/78  Pulse 97  Temp(Src) 97.8 F (36.6 C) (Oral)  Resp 18  SpO2 98%  Physical Exam  Nursing note and vitals reviewed. Constitutional: She is oriented to person, place, and time. She appears well-developed and well-nourished. No distress.  HENT:  Head: Normocephalic and atraumatic.  Mouth/Throat: Oropharynx is clear and moist.  Eyes: EOM are normal. Pupils are equal, round, and reactive to light.  Neck: Normal range of motion. Neck supple.  Cardiovascular: Normal rate and regular rhythm.   Pulmonary/Chest: Effort normal and breath sounds normal. No respiratory distress. She has no wheezes. She has no rales.  Abdominal: Soft. Bowel sounds are normal.  Musculoskeletal: Normal range of motion. She exhibits no edema and no tenderness.  Neurological: She is alert and oriented to person, place, and time.       5/5 motor, sensation intact, CN II-XII intact, finger to nose intact.   Skin: Skin is warm and  dry. No rash noted. No erythema.  Psychiatric: She has a normal mood and affect. Her behavior is normal.    ED Course  Procedures (including critical care time)  Labs Reviewed  BASIC METABOLIC PANEL - Abnormal; Notable for the following:    Glucose, Bld 116 (*)    All other components within normal limits  URINALYSIS, ROUTINE W REFLEX MICROSCOPIC - Abnormal; Notable for the following:    APPearance CLOUDY (*)    Leukocytes, UA SMALL (*)    All other components within normal limits  URINE MICROSCOPIC-ADD ON - Abnormal; Notable for the following:     Bacteria, UA MANY (*)    All other components within normal limits  CBC  DIFFERENTIAL  PREGNANCY, URINE   No results found.   1. Recurrent seizures   2. UTI (lower urinary tract infection)     Date: 11/07/2011  Rate: 109  Rhythm: sinus tachycardia  QRS Axis: normal  Intervals: normal  ST/T Wave abnormalities: normal  Conduction Disutrbances:none  Narrative Interpretation:   Old EKG Reviewed: unchanged     MDM  Will add keppra to topamax and pt needs to call her neurologist in the AM to schedule follow up  appointment. I have cautioned her against driving or heavy machinery operation. Return for worsening symptoms or concerns        Loren Racer, MD 11/07/11 [phone]

## 2012-03-10 ENCOUNTER — Emergency Department (HOSPITAL_COMMUNITY)
Admission: EM | Admit: 2012-03-10 | Discharge: 2012-03-10 | Disposition: A | Discharge status: Home / Self Care | Payer: Medicaid Other | Attending: Emergency Medicine | Admitting: Emergency Medicine

## 2012-03-10 ENCOUNTER — Encounter (HOSPITAL_COMMUNITY): Payer: Self-pay | Admitting: Emergency Medicine

## 2012-03-10 DIAGNOSIS — F17200 Nicotine dependence, unspecified, uncomplicated: Secondary | ICD-10-CM | POA: Insufficient documentation

## 2012-03-10 DIAGNOSIS — Z79899 Other long term (current) drug therapy: Secondary | ICD-10-CM | POA: Insufficient documentation

## 2012-03-10 DIAGNOSIS — R569 Unspecified convulsions: Secondary | ICD-10-CM | POA: Insufficient documentation

## 2012-03-10 DIAGNOSIS — F445 Conversion disorder with seizures or convulsions: Secondary | ICD-10-CM

## 2012-03-10 LAB — URINALYSIS, ROUTINE W REFLEX MICROSCOPIC
Bilirubin Urine: NEGATIVE
Glucose, UA: NEGATIVE mg/dL
Ketones, ur: NEGATIVE mg/dL
Protein, ur: NEGATIVE mg/dL
pH: 6.5 (ref 5.0–8.0)

## 2012-03-10 NOTE — ED Provider Notes (Signed)
History     CSN: [id number]  Arrival date & time 03/10/12  1332   First MD Initiated Contact with Patient 03/10/12 1410      Chief Complaint  Patient presents with  . Seizures    (Consider location/radiation/quality/duration/timing/severity/associated sxs/prior treatment) HPI  36 year old female with hx of  syncope/seizure-like symptoms since May of this year and thus far negative workup. Has had CT, MRI and EEG. She's here for an evaluation of seizure-like episode today.  Pt reports she has notices her heart has been racing and she has not "feel like myself" lately.  She went to her PCP office today for a check up due to her sxs.  However, while the staff were obtaining her VS, pt felt increased heart racing, and she experienced her body going into convulsion.  She report sxs lasting for a minute, she remembers all of it.  She denies confusion, headache, vision changes, tongue biting, urinary or bowel incontinence.  Denies any other pain.  Does sts she feels like urinating but unable to, and request catherization.  She denies SI/HI, increase depression or anxiety.  Denies any rec drugs, or alcohol use.  Is currently on Depo Provera shot and does not have menstrual period.  Pt currently take Keppra and topamax.      Past Medical History  Diagnosis Date  . Seizures   . Syncope     Past Surgical History  Procedure Date  . Appendectomy   . Cesarean section     History reviewed. No pertinent family history.  History  Substance Use Topics  . Smoking status: Current Every Day Smoker -- 0.5 packs/day  . Smokeless tobacco: Not on file  . Alcohol Use: Yes    OB History    Grav Para Term Preterm Abortions TAB SAB Ect Mult Living                  Review of Systems  All other systems reviewed and are negative.    Allergies  Review of patient's allergies indicates no known allergies.  Home Medications   Current Outpatient Rx  Name Route Sig Dispense Refill  . ALPRAZOLAM  0.5 MG PO TABS Oral Take 0.5 mg by mouth 2 (two) times daily as needed. For anxiety    . HYDROCODONE-ACETAMINOPHEN 5-500 MG PO TABS Oral Take 1 tablet by mouth every 4 (four) hours as needed.    Marland Kitchen LEVETIRACETAM 500 MG PO TABS Oral Take 1 tablet (500 mg total) by mouth every 12 (twelve) hours. 60 tablet 0  . MEDROXYPROGESTERONE ACETATE 150 MG/ML IM SUSP Intramuscular Inject 150 mg into the muscle every 3 (three) months.    . SERTRALINE HCL 100 MG PO TABS Oral Take 100 mg by mouth daily.    . TOPIRAMATE 25 MG PO TABS Oral Take 25 mg by mouth daily.      BP 110/74  Pulse 102  Temp 98.7 F (37.1 C) (Oral)  Resp 16  SpO2 97%  Physical Exam  Nursing note and vitals reviewed. Constitutional: She is oriented to person, place, and time. She appears well-developed and well-nourished. No distress.       Awake, alert, nontoxic appearance  HENT:  Head: Atraumatic.  Eyes: Conjunctivae normal are normal. Right eye exhibits no discharge. Left eye exhibits no discharge.  Neck: Neck supple.  Cardiovascular: Normal rate and regular rhythm.   Pulmonary/Chest: Effort normal. No respiratory distress. She exhibits no tenderness.  Abdominal: Soft. There is no tenderness. There is no rebound.  No obvious evidence of bladder distension, abd nontender on palpation.    Musculoskeletal: She exhibits no tenderness.       ROM appears intact, no obvious focal weakness  Neurological: She is alert and oriented to person, place, and time. She has normal strength. No cranial nerve deficit or sensory deficit. She displays a negative Romberg sign. Coordination and gait normal. GCS eye subscore is 4. GCS verbal subscore is 5. GCS motor subscore is 6.  Reflex Scores:      Patellar reflexes are 2+ on the right side and 2+ on the left side.      Mental status and motor strength appears intact  Skin: No rash noted.  Psychiatric: She has a normal mood and affect. Her speech is normal and behavior is normal. Thought  content normal.    ED Course  Procedures (including critical care time)  Labs Reviewed - No data to display No results found.   No diagnosis found.   Date: 03/10/2012  Rate: 105  Rhythm: sinus tachycardia  QRS Axis: normal  Intervals: normal  ST/T Wave abnormalities: normal  Conduction Disutrbances:none  Narrative Interpretation:   Old EKG Reviewed: none available  Results for orders placed during the hospital encounter of 03/10/12  URINALYSIS, ROUTINE W REFLEX MICROSCOPIC      Component Value Range   Color, Urine YELLOW  YELLOW   APPearance CLEAR  CLEAR   Specific Gravity, Urine 1.023  1.005 - 1.030   pH 6.5  5.0 - 8.0   Glucose, UA NEGATIVE  NEGATIVE mg/dL   Hgb urine dipstick NEGATIVE  NEGATIVE   Bilirubin Urine NEGATIVE  NEGATIVE   Ketones, ur NEGATIVE  NEGATIVE mg/dL   Protein, ur NEGATIVE  NEGATIVE mg/dL   Urobilinogen, UA 1.0  0.0 - 1.0 mg/dL   Nitrite NEGATIVE  NEGATIVE   Leukocytes, UA NEGATIVE  NEGATIVE  PREGNANCY, URINE      Component Value Range   Preg Test, Ur NEGATIVE  NEGATIVE   16:11 Orthostatic Vital Signs Orthstatic P/BP - Pulse Rate: 92 ; Pulse Rate Source: Dinamap; Left ; BP: 115/77 ; BP Location: Left arm ; BP Method: Automatic ; Patient Position, if appropriate: Lying Juanetta Beets, NT 16:11:27 Orthostatic vital signs Completed Orthostatic vital signs Juanetta Beets, NT 16:12 Orthostatic Vital Signs Orthstatic P/BP - Pulse Rate: 95 ; Pulse Rate Source: Left ; BP: 102/76 ; BP Location: Left arm ; BP Method: Automatic ; Patient Position, if appropriate: Sitting Juanetta Beets, NT 16:13 Orthostatic Vital Signs Orthstatic P/BP - Pulse Rate: 99 ; Pulse Rate Source: Left ; BP: 105/75 ; BP Location: Left arm ; BP Method: Automatic ; Patient Position, if appropriate: Standing Juanetta Beets, NT   Date: 03/10/2012  Rate: 105  Rhythm: sinus tachycardia  QRS Axis: normal  Intervals: normal  ST/T Wave abnormalities:  normal  Conduction Disutrbances:none  Narrative Interpretation:   Old EKG Reviewed: unchanged  1. pseudoseizure  MDM  Seizure like sxs, which i do not believe pt had an actual seizure episode.  Has mult. ER visits for same, has had negative work up in the past.  In no acute distress at this time.  Has no focal neuro deficits.     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  These diagnoses are consistent with her presenting symptoms.     4:40 PM Work up unremarkable.  No anemia, normal orthostatic VS.  Pt has no focal neuro  deficits, normal UA, normal gait.  Will d/c as pt able to tolerates PO.  Pt is tachycardic.  However no cp or sob.  Neg Well's criteria for PE, low suspicion for PE.  My attending is aware.  I spend a moderate amount of time reading through her prior note.  I do not believe pt will benefit an further inpt evaluation for her Sxs in the ER.  I recommend f/u with neurologist for further care.  Pt stable to be discharge.    BP 105/75  Pulse 99  Temp 98.7 F (37.1 C) (Oral)  Resp 16  SpO2 97%  Nursing notes reviewed and considered in documentation  Previous records reviewed and considered  All labs/vitals reviewed and considered      Fayrene Helper, PA-C 03/10/12 1647

## 2012-03-10 NOTE — ED Notes (Signed)
Bed:[facility]A<BR> Expected date:<BR> Expected time: 1:26 PM<BR> Means of arrival:Ambulance<BR> Comments:<BR> M41- 36yoF- seizure, A&amp;O currently

## 2012-03-10 NOTE — ED Notes (Addendum)
Pt brought in by EMS from MDs office for s/p Sz lasting >1 min pluse ox placed on finger heart rate 250 bpm no postectal now pt is ax4

## 2012-03-11 NOTE — ED Provider Notes (Signed)
Medical screening examination/treatment/procedure(s) were performed by non-physician practitioner and as supervising physician I was immediately available for consultation/collaboration.   Celene Kras, MD 03/11/12 [phone]

## 2013-04-05 ENCOUNTER — Ambulatory Visit: Payer: Medicaid Other | Admitting: Cardiovascular Disease

## 2013-06-24 ENCOUNTER — Emergency Department (HOSPITAL_COMMUNITY)
Admission: EM | Admit: 2013-06-24 | Discharge: 2013-06-24 | Discharge status: Against Medical Advice | Payer: BC Managed Care – PPO | Attending: Emergency Medicine | Admitting: Emergency Medicine

## 2013-06-24 ENCOUNTER — Encounter (HOSPITAL_COMMUNITY): Payer: Self-pay | Admitting: Emergency Medicine

## 2013-06-24 DIAGNOSIS — F17200 Nicotine dependence, unspecified, uncomplicated: Secondary | ICD-10-CM | POA: Insufficient documentation

## 2013-06-24 DIAGNOSIS — R569 Unspecified convulsions: Secondary | ICD-10-CM | POA: Insufficient documentation

## 2013-06-24 HISTORY — DX: Cerebral infarction, unspecified: I63.9

## 2013-06-24 NOTE — ED Notes (Signed)
Bed: [facility] Expected date:  Expected time:  Means of arrival:  Comments: EMS- anxiety 

## 2013-06-24 NOTE — ED Notes (Signed)
Pt came out to desk, ask what she needed. Pt states that she knows what is wrong with her and is going home to lay in her bed.

## 2013-06-24 NOTE — ED Notes (Signed)
Pt states that she hasnt been taking her mood stabilizer. Pt states that stress and anxiety  Can cause her seizures and she has been under a lot of stress lately.  Pt changing into gown.

## 2013-06-24 NOTE — ED Notes (Signed)
Pt was at work and they called due to she was having seizure like activity. Pt was having shaking , no loc, alert to person and place ,not post ictal, did not void on self. Pt did report to ems that she has stressors at home and that she is lossing her apt. Denies any SI/ HI thoughts.

## 2013-08-22 ENCOUNTER — Encounter (HOSPITAL_COMMUNITY): Payer: Self-pay | Admitting: Emergency Medicine

## 2013-08-22 ENCOUNTER — Emergency Department (HOSPITAL_COMMUNITY)
Admission: EM | Admit: 2013-08-22 | Discharge: 2013-08-23 | Disposition: A | Discharge status: Home / Self Care | Payer: BC Managed Care – PPO | Attending: Emergency Medicine | Admitting: Emergency Medicine

## 2013-08-22 DIAGNOSIS — R51 Headache: Secondary | ICD-10-CM | POA: Insufficient documentation

## 2013-08-22 DIAGNOSIS — F17200 Nicotine dependence, unspecified, uncomplicated: Secondary | ICD-10-CM | POA: Insufficient documentation

## 2013-08-22 DIAGNOSIS — Z8673 Personal history of transient ischemic attack (TIA), and cerebral infarction without residual deficits: Secondary | ICD-10-CM | POA: Insufficient documentation

## 2013-08-22 DIAGNOSIS — R569 Unspecified convulsions: Secondary | ICD-10-CM

## 2013-08-22 DIAGNOSIS — F411 Generalized anxiety disorder: Secondary | ICD-10-CM | POA: Insufficient documentation

## 2013-08-22 DIAGNOSIS — R110 Nausea: Secondary | ICD-10-CM | POA: Insufficient documentation

## 2013-08-22 NOTE — ED Notes (Signed)
Patient presents with family member stating that she had a seizure approx 45 mins ago.  Full body seizure X2, denies incont.

## 2013-08-23 LAB — CBC WITH DIFFERENTIAL/PLATELET
BASOS ABS: 0 10*3/uL (ref 0.0–0.1)
BASOS PCT: 1 % (ref 0–1)
Eosinophils Absolute: 0 10*3/uL (ref 0.0–0.7)
Eosinophils Relative: 1 % (ref 0–5)
HEMATOCRIT: 41.3 % (ref 36.0–46.0)
Hemoglobin: 14.3 g/dL (ref 12.0–15.0)
LYMPHS PCT: 36 % (ref 12–46)
Lymphs Abs: 2 10*3/uL (ref 0.7–4.0)
MCH: 30.8 pg (ref 26.0–34.0)
MCHC: 34.6 g/dL (ref 30.0–36.0)
MCV: 89 fL (ref 78.0–100.0)
Monocytes Absolute: 0.5 10*3/uL (ref 0.1–1.0)
Monocytes Relative: 9 % (ref 3–12)
NEUTROS ABS: 3 10*3/uL (ref 1.7–7.7)
NEUTROS PCT: 54 % (ref 43–77)
PLATELETS: 373 10*3/uL (ref 150–400)
RBC: 4.64 MIL/uL (ref 3.87–5.11)
RDW: 15 % (ref 11.5–15.5)
WBC: 5.5 10*3/uL (ref 4.0–10.5)

## 2013-08-23 LAB — URINALYSIS, ROUTINE W REFLEX MICROSCOPIC
BILIRUBIN URINE: NEGATIVE
GLUCOSE, UA: NEGATIVE mg/dL
HGB URINE DIPSTICK: NEGATIVE
Ketones, ur: 15 mg/dL — AB
Leukocytes, UA: NEGATIVE
Nitrite: NEGATIVE
PROTEIN: NEGATIVE mg/dL
SPECIFIC GRAVITY, URINE: 1.035 — AB (ref 1.005–1.030)
UROBILINOGEN UA: 1 mg/dL (ref 0.0–1.0)
pH: 6 (ref 5.0–8.0)

## 2013-08-23 LAB — COMPREHENSIVE METABOLIC PANEL
ALBUMIN: 3.7 g/dL (ref 3.5–5.2)
ALT: 14 U/L (ref 0–35)
AST: 15 U/L (ref 0–37)
Alkaline Phosphatase: 44 U/L (ref 39–117)
BILIRUBIN TOTAL: 0.4 mg/dL (ref 0.3–1.2)
BUN: 10 mg/dL (ref 6–23)
CHLORIDE: 104 meq/L (ref 96–112)
CO2: 23 meq/L (ref 19–32)
Calcium: 9.4 mg/dL (ref 8.4–10.5)
Creatinine, Ser: 0.74 mg/dL (ref 0.50–1.10)
GFR calc Af Amer: >90 mL/min (ref >90)
Glucose, Bld: 95 mg/dL (ref 70–99)
Potassium: 4 mEq/L (ref 3.7–5.3)
SODIUM: 140 meq/L (ref 137–147)
Total Protein: 7.1 g/dL (ref 6.0–8.3)

## 2013-08-23 LAB — PREGNANCY, URINE: PREG TEST UR: NEGATIVE

## 2013-08-23 NOTE — Discharge Instructions (Signed)
Nonepileptic Seizures Nonepileptic seizures look like true epileptic seizures. The difference between nonepileptic seizures and real seizures is that real seizures are caused by an electrical abnormality in the brain. Nonepileptic seizures have no medical cause. Nonepileptic seizures may look real to an untrained person. A neurologist can usually tell the difference between a real seizure and a nonepileptic seizure. Nonepileptic seizures may also be called pseudoseizures. They are more frequent in women. CAUSES  In general, the patient is unaware that the movements are not real seizures. This disorder is caused by stress or emotional trauma. Patients often feel badly. Patients are sometimes accused of causing the seizure-like movements when they are not aware that their symptoms are due to stress. The nonepileptic seizures are real and frightening to patients with this disorder. Sometimes, nonepileptic seizures may be due to a person faking the symptoms to get something he or she wants. DIAGNOSIS  The diagnosis requires the patient to be continuously monitored by:  EEG (electroencephalogram).  Video camera. After an episode, the patient is asked about their awareness, memory, and feelings during the seizure. The family, if present, also discusses what they see. The EEG and clinical information allows the neurologist to determine if the seizures are related to abnormal electrical activity in the brain. TREATMENT  Medicines may be stopped if the patient has been treated for a true seizure disorder. Patient counseling is usually begun. Depression and anxiety, if present, are treated. Counseling helps to resolve stress.  Document Released: 07/18/2005 Document Revised: 08/25/2011 Document Reviewed: 12/14/2008 [facility] Patient Information 2014 [place], [place].

## 2013-08-23 NOTE — ED Notes (Signed)
Reports back to back seizures tonight approx 1900.  Reports she did not feel well and was speaking to s/o when the seizure began.  Denies incontinence.

## 2013-08-23 NOTE — ED Provider Notes (Signed)
CSN: [id number]     Arrival date & time 08/22/13  2302 History   First MD Initiated Contact with Patient 08/22/13 2353     Chief Complaint  Patient presents with  . Seizures     (Consider location/radiation/quality/duration/timing/severity/associated sxs/prior Treatment) HPI Patient has a history of seizure like activity for which she says is being managed with mood and anxiety stabilizers. She states she's been out of this medication for some time. She's had previous seizure workup including MRI and EEG which were both normal. She states that she drank a beverage with caffeine and began feeling shaky this evening. She then had dry heaves. When her boyfriend came home she had a seizure-like episode which she remembers completely. She states it lasted only a few minutes. She came immediately to after the shaking stopped. She had no incontinence or tongue biting. Size any trauma. Currently she has a mild frontal headache but denies any neck pain. She has no focal weakness or numbness. She is asking if she can eat when her boyfriend brings back food. Past Medical History  Diagnosis Date  . Seizures   . Syncope   . Stroke    Past Surgical History  Procedure Laterality Date  . Appendectomy    . Cesarean section     History reviewed. No pertinent family history. History  Substance Use Topics  . Smoking status: Current Every Day Smoker -- 0.50 packs/day  . Smokeless tobacco: Not on file  . Alcohol Use: Yes   OB History   Grav Para Term Preterm Abortions TAB SAB Ect Mult Living                 Review of Systems  Constitutional: Negative for fever and chills.  Respiratory: Negative for shortness of breath.   Cardiovascular: Negative for chest pain.  Gastrointestinal: Positive for nausea. Negative for vomiting, abdominal pain and diarrhea.  Genitourinary: Negative for dysuria, flank pain and difficulty urinating.  Musculoskeletal: Negative for back pain, myalgias, neck pain and neck  stiffness.  Neurological: Positive for headaches. Negative for dizziness, syncope, weakness, light-headedness and numbness.  Psychiatric/Behavioral: The patient is nervous/anxious.   All other systems reviewed and are negative.      Allergies  Review of patient's allergies indicates no known allergies.  Home Medications   Current Outpatient Rx  Name  Route  Sig  Dispense  Refill  . medroxyPROGESTERone (DEPO-PROVERA) 150 MG/ML injection   Intramuscular   Inject 150 mg into the muscle every 3 (three) months.          BP 102/73  Pulse 87  Temp(Src) 98.1 F (36.7 C) (Oral)  Resp 18  SpO2 97% Physical Exam  Nursing note and vitals reviewed. Constitutional: She is oriented to person, place, and time. She appears well-developed and well-nourished. No distress.  HENT:  Head: Normocephalic and atraumatic.  Mouth/Throat: Oropharynx is clear and moist.  No tongue injury.  Eyes: EOM are normal. Pupils are equal, round, and reactive to light.  Neck: Normal range of motion. Neck supple.  The posterior is midline cervical tenderness  Cardiovascular: Normal rate and regular rhythm.  Exam reveals no gallop and no friction rub.   No murmur heard. Pulmonary/Chest: Effort normal and breath sounds normal. No respiratory distress. She has no wheezes. She has no rales. She exhibits no tenderness.  Abdominal: Soft. Bowel sounds are normal. She exhibits no distension and no mass. There is no tenderness. There is no rebound and no guarding.  Musculoskeletal: Normal range of  motion. She exhibits no edema and no tenderness.  Neurological: She is alert and oriented to person, place, and time.  Patient is alert and oriented x3 with clear, goal oriented speech. Patient has 5/5 motor in all extremities. Sensation is intact to light touch. Bilateral finger-to-nose is normal with no signs of dysmetria.   Skin: Skin is warm and dry. No rash noted. No erythema.  Psychiatric: She has a normal mood and  affect. Her behavior is normal.    ED Course  Procedures (including critical care time) Labs Review Labs Reviewed  CBC WITH DIFFERENTIAL  COMPREHENSIVE METABOLIC PANEL  URINALYSIS, ROUTINE W REFLEX MICROSCOPIC  PREGNANCY, URINE   Imaging Review No results found.   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 Will rule out electrolyte abnormality or hypoglycemia. Will monitor in the emergency department and reassess.  Patient no further episodes in emergency department. Patient will be discharged home to followup with her primary Dr. Return precautions have been given.  Julianne Rice, MD 08/23/13 [phone]

## 2014-10-03 ENCOUNTER — Emergency Department (HOSPITAL_COMMUNITY): Payer: 59

## 2014-10-03 ENCOUNTER — Emergency Department (HOSPITAL_COMMUNITY)
Admission: EM | Admit: 2014-10-03 | Discharge: 2014-10-03 | Disposition: A | Discharge status: Home / Self Care | Payer: 59 | Attending: Emergency Medicine | Admitting: Emergency Medicine

## 2014-10-03 ENCOUNTER — Encounter (HOSPITAL_COMMUNITY): Payer: Self-pay | Admitting: Neurology

## 2014-10-03 DIAGNOSIS — R1033 Periumbilical pain: Secondary | ICD-10-CM | POA: Insufficient documentation

## 2014-10-03 DIAGNOSIS — R1011 Right upper quadrant pain: Secondary | ICD-10-CM | POA: Diagnosis not present

## 2014-10-03 DIAGNOSIS — R1012 Left upper quadrant pain: Secondary | ICD-10-CM | POA: Insufficient documentation

## 2014-10-03 DIAGNOSIS — Z9889 Other specified postprocedural states: Secondary | ICD-10-CM | POA: Diagnosis not present

## 2014-10-03 DIAGNOSIS — Z8673 Personal history of transient ischemic attack (TIA), and cerebral infarction without residual deficits: Secondary | ICD-10-CM | POA: Insufficient documentation

## 2014-10-03 DIAGNOSIS — R112 Nausea with vomiting, unspecified: Secondary | ICD-10-CM | POA: Insufficient documentation

## 2014-10-03 DIAGNOSIS — R1031 Right lower quadrant pain: Secondary | ICD-10-CM | POA: Diagnosis not present

## 2014-10-03 DIAGNOSIS — R109 Unspecified abdominal pain: Secondary | ICD-10-CM | POA: Diagnosis present

## 2014-10-03 DIAGNOSIS — Z720 Tobacco use: Secondary | ICD-10-CM | POA: Insufficient documentation

## 2014-10-03 DIAGNOSIS — R1013 Epigastric pain: Secondary | ICD-10-CM | POA: Diagnosis not present

## 2014-10-03 DIAGNOSIS — R1030 Lower abdominal pain, unspecified: Secondary | ICD-10-CM

## 2014-10-03 DIAGNOSIS — Z79899 Other long term (current) drug therapy: Secondary | ICD-10-CM | POA: Insufficient documentation

## 2014-10-03 DIAGNOSIS — Z3202 Encounter for pregnancy test, result negative: Secondary | ICD-10-CM | POA: Insufficient documentation

## 2014-10-03 LAB — COMPREHENSIVE METABOLIC PANEL
ALBUMIN: 3.6 g/dL (ref 3.5–5.2)
ALT: 8 U/L (ref 0–35)
AST: 13 U/L (ref 0–37)
Alkaline Phosphatase: 49 U/L (ref 39–117)
Anion gap: 9 (ref 5–15)
BUN: 12 mg/dL (ref 6–23)
CALCIUM: 8.6 mg/dL (ref 8.4–10.5)
CO2: 22 mmol/L (ref 19–32)
CREATININE: 0.85 mg/dL (ref 0.50–1.10)
Chloride: 107 mmol/L (ref 96–112)
GFR calc Af Amer: >90 mL/min (ref >90)
GFR calc non Af Amer: 85 mL/min — ABNORMAL LOW (ref >90)
Glucose, Bld: 98 mg/dL (ref 70–99)
Potassium: 3.6 mmol/L (ref 3.5–5.1)
SODIUM: 138 mmol/L (ref 135–145)
Total Bilirubin: 1 mg/dL (ref 0.3–1.2)
Total Protein: 6.6 g/dL (ref 6.0–8.3)

## 2014-10-03 LAB — CBC WITH DIFFERENTIAL/PLATELET
Basophils Absolute: 0 10*3/uL (ref 0.0–0.1)
Basophils Relative: 1 % (ref 0–1)
Eosinophils Absolute: 0.1 10*3/uL (ref 0.0–0.7)
Eosinophils Relative: 3 % (ref 0–5)
HEMATOCRIT: 40.7 % (ref 36.0–46.0)
HEMOGLOBIN: 13.8 g/dL (ref 12.0–15.0)
LYMPHS ABS: 1.8 10*3/uL (ref 0.7–4.0)
LYMPHS PCT: 43 % (ref 12–46)
MCH: 30.3 pg (ref 26.0–34.0)
MCHC: 33.9 g/dL (ref 30.0–36.0)
MCV: 89.3 fL (ref 78.0–100.0)
MONO ABS: 0.5 10*3/uL (ref 0.1–1.0)
MONOS PCT: 11 % (ref 3–12)
NEUTROS ABS: 1.9 10*3/uL (ref 1.7–7.7)
Neutrophils Relative %: 44 % (ref 43–77)
Platelets: 356 10*3/uL (ref 150–400)
RBC: 4.56 MIL/uL (ref 3.87–5.11)
RDW: 13.9 % (ref 11.5–15.5)
WBC: 4.3 10*3/uL (ref 4.0–10.5)

## 2014-10-03 LAB — WET PREP, GENITAL
TRICH WET PREP: NONE SEEN
Yeast Wet Prep HPF POC: NONE SEEN

## 2014-10-03 LAB — URINALYSIS, ROUTINE W REFLEX MICROSCOPIC
Glucose, UA: NEGATIVE mg/dL
KETONES UR: 15 mg/dL — AB
NITRITE: NEGATIVE
PROTEIN: 30 mg/dL — AB
SPECIFIC GRAVITY, URINE: 1.039 — AB (ref 1.005–1.030)
UROBILINOGEN UA: 0.2 mg/dL (ref 0.0–1.0)
pH: 6 (ref 5.0–8.0)

## 2014-10-03 LAB — URINE MICROSCOPIC-ADD ON

## 2014-10-03 LAB — POC URINE PREG, ED: Preg Test, Ur: NEGATIVE

## 2014-10-03 LAB — LIPASE, BLOOD: LIPASE: 24 U/L (ref 11–59)

## 2014-10-03 MED ORDER — SODIUM CHLORIDE 0.9 % IV BOLUS (SEPSIS)
1000.0000 mL | Freq: Once | INTRAVENOUS | Status: AC
  Administered 2014-10-03: 1000 mL via INTRAVENOUS

## 2014-10-03 MED ORDER — ONDANSETRON HCL 4 MG/2ML IJ SOLN
4.0000 mg | Freq: Once | INTRAMUSCULAR | Status: AC
  Administered 2014-10-03: 4 mg via INTRAVENOUS
  Filled 2014-10-03: qty 2

## 2014-10-03 MED ORDER — HYDROCODONE-ACETAMINOPHEN 5-325 MG PO TABS: 1.0000 | ORAL_TABLET | Freq: Four times a day (QID) | ORAL | Status: DC | PRN

## 2014-10-03 MED ORDER — MORPHINE SULFATE 4 MG/ML IJ SOLN
6.0000 mg | Freq: Once | INTRAMUSCULAR | Status: AC
  Administered 2014-10-03: 6 mg via INTRAVENOUS
  Filled 2014-10-03: qty 2

## 2014-10-03 MED ORDER — IOHEXOL 300 MG/ML  SOLN
100.0000 mL | Freq: Once | INTRAMUSCULAR | Status: AC | PRN
  Administered 2014-10-03: 100 mL via INTRAVENOUS

## 2014-10-03 MED ORDER — HYDROCODONE-ACETAMINOPHEN 5-325 MG PO TABS
2.0000 | ORAL_TABLET | Freq: Once | ORAL | Status: AC
  Administered 2014-10-03: 2 via ORAL
  Filled 2014-10-03: qty 2

## 2014-10-03 NOTE — ED Notes (Signed)
Pt changing into gown. Dr. Mingo Amber at bedside.

## 2014-10-03 NOTE — ED Provider Notes (Signed)
CSN: [id number]     Arrival date & time 10/03/14  1136 History   First MD Initiated Contact with Patient 10/03/14 1250     Chief Complaint  Patient presents with  . Abdominal Pain     (Consider location/radiation/quality/duration/timing/severity/associated sxs/prior Treatment) Patient is a 39 y.o. female presenting with abdominal pain. The history is provided by the patient.  Abdominal Pain Pain location:  Epigastric, periumbilical, LUQ, RUQ and RLQ Pain quality: burning and sharp   Pain quality: not aching   Pain radiates to:  Does not radiate Pain severity:  Moderate Onset quality:  Sudden Duration:  1 day Timing:  Constant Progression:  Unchanged Chronicity:  New Context: not eating, not sick contacts and not trauma   Relieved by:  Nothing Worsened by:  Nothing tried Associated symptoms: nausea and vomiting   Associated symptoms: no cough, no diarrhea, no fever and no shortness of breath     Past Medical History  Diagnosis Date  . Seizures   . Syncope   . Stroke    Past Surgical History  Procedure Laterality Date  . Appendectomy    . Cesarean section     No family history on file. History  Substance Use Topics  . Smoking status: Current Every Day Smoker -- 0.50 packs/day  . Smokeless tobacco: Not on file  . Alcohol Use: Yes   OB History    No data available     Review of Systems  Constitutional: Negative for fever.  Respiratory: Negative for cough and shortness of breath.   Gastrointestinal: Positive for nausea, vomiting and abdominal pain. Negative for diarrhea.  All other systems reviewed and are negative.     Allergies  Review of patient's allergies indicates no known allergies.  Home Medications   Prior to Admission medications   Medication Sig Start Date End Date Taking? Authorizing Provider  medroxyPROGESTERone (DEPO-PROVERA) 150 MG/ML injection Inject 150 mg into the muscle every 3 (three) months.    Historical Provider, MD  Prenatal  Vit-Fe Fumarate-FA (MULTIVITAMIN-PRENATAL) 27-0.8 MG TABS tablet Take 1 tablet by mouth daily at 12 noon.    Historical Provider, MD   BP 105/70 mmHg  Pulse 103  Temp(Src) 98.4 F (36.9 C) (Oral)  Resp 14  Wt 138 lb (62.596 kg)  SpO2 98% Physical Exam  Constitutional: She is oriented to person, place, and time. She appears well-developed and well-nourished. No distress.  HENT:  Head: Normocephalic and atraumatic.  Mouth/Throat: Oropharynx is clear and moist.  Eyes: EOM are normal. Pupils are equal, round, and reactive to light.  Neck: Normal range of motion. Neck supple.  Cardiovascular: Normal rate and regular rhythm.  Exam reveals no friction rub.   No murmur heard. Pulmonary/Chest: Effort normal and breath sounds normal. No respiratory distress. She has no wheezes. She has no rales.  Abdominal: Soft. She exhibits no distension. There is no tenderness. There is no rebound.  Genitourinary: Cervix exhibits no motion tenderness and no discharge. Right adnexum displays no tenderness. Left adnexum displays no tenderness.  Musculoskeletal: Normal range of motion. She exhibits no edema.  Neurological: She is alert and oriented to person, place, and time.  Skin: She is not diaphoretic.  Nursing note and vitals reviewed.   ED Course  Procedures (including critical care time) Labs Review Labs Reviewed  COMPREHENSIVE METABOLIC PANEL - Abnormal; Notable for the following:    GFR calc non Af Amer 85 (*)    All other components within normal limits  CBC WITH DIFFERENTIAL/PLATELET  LIPASE, BLOOD  URINALYSIS, ROUTINE W REFLEX MICROSCOPIC  POC URINE PREG, ED    Imaging Review Ct Abdomen Pelvis W Contrast  10/03/2014   CLINICAL DATA:  Mid abdominal pain for 2 days with spurring sensation with eating, vomited twice  EXAM: CT ABDOMEN AND PELVIS WITH CONTRAST  TECHNIQUE: Multidetector CT imaging of the abdomen and pelvis was performed using the standard protocol following bolus administration  of intravenous contrast.  CONTRAST:  158mL OMNIPAQUE IOHEXOL 300 MG/ML  SOLN  COMPARISON:  None.  FINDINGS: Visualized portions of the lung bases are clear.  Mild hepatic steatosis. Gallbladder spleen pancreas kidneys and adrenal glands normal. No significant abnormalities involving the aortoiliac vessels.  Stomach small bowel and large bowel normal. Appendix not identified.  Bladder and reproductive organs normal. Trace free fluid in the right at adnexa and cul-de-sac. Tiny focus of hyperattenuation along the dependent aspect of the trace fluid in the left cul de sac on image 75, suggesting possibility of hemorrhage.  IMPRESSION: Trace free fluid in the pelvis, well within physiologic limits of normal, but with suggestion of tiny focus of hemorrhage within it.Rupture of hemorrhagic ovarian cyst is one consideration. Consider pelvis ultrasound.   Electronically Signed   By: Skipper Cliche M.D.   On: 10/03/2014 15:26     EKG Interpretation None      MDM   Final diagnoses:  Lower abdominal pain    11F here with abdominal pain. Began yesterday after eating an omelette at work. Patient had a couple episodes of vomiting but no by diarrhea. No fever. Here she is afebrile, well-appearing. She went to urgent care yesterday and they wanted to get her CT but could get insurance figured out. She's here wanting imaging. On exam she has right lower quadrant, epigastric, right upper quadrant, periumbilical pain. Will plan for CT. CT negative except for mild blood in lower pelvis. Will [place]. Negative pelvic, no tenderness or mass.    Evelina Bucy, MD 10/03/14 [phone]

## 2014-10-03 NOTE — ED Notes (Signed)
Pt reports mid abd pain since yesterday. Feels nauseated, and can't eat because of pain. Was seen at [facility] for same.

## 2014-10-03 NOTE — Discharge Instructions (Signed)
Abdominal Pain, Women °Abdominal (stomach, pelvic, or belly) pain can be caused by many things. It is important to tell your doctor: °· The location of the pain. °· Does it come and go or is it present all the time? °· Are there things that start the pain (eating certain foods, exercise)? °· Are there other symptoms associated with the pain (fever, nausea, vomiting, diarrhea)? °All of this is helpful to know when trying to find the cause of the pain. °CAUSES  °· Stomach: virus or bacteria infection, or ulcer. °· Intestine: appendicitis (inflamed appendix), regional ileitis (Crohn's disease), ulcerative colitis (inflamed colon), irritable bowel syndrome, diverticulitis (inflamed diverticulum of the colon), or cancer of the stomach or intestine. °· Gallbladder disease or stones in the gallbladder. °· Kidney disease, kidney stones, or infection. °· Pancreas infection or cancer. °· Fibromyalgia (pain disorder). °· Diseases of the female organs: °¨ Uterus: fibroid (non-cancerous) tumors or infection. °¨ Fallopian tubes: infection or tubal pregnancy. °¨ Ovary: cysts or tumors. °¨ Pelvic adhesions (scar tissue). °¨ Endometriosis (uterus lining tissue growing in the pelvis and on the pelvic organs). °¨ Pelvic congestion syndrome (female organs filling up with blood just before the menstrual period). °¨ Pain with the menstrual period. °¨ Pain with ovulation (producing an egg). °¨ Pain with an IUD (intrauterine device, birth control) in the uterus. °¨ Cancer of the female organs. °· Functional pain (pain not caused by a disease, may improve without treatment). °· Psychological pain. °· Depression. °DIAGNOSIS  °Your doctor will decide the seriousness of your pain by doing an examination. °· Blood tests. °· X-rays. °· Ultrasound. °· CT scan (computed tomography, special type of X-ray). °· MRI (magnetic resonance imaging). °· Cultures, for infection. °· Barium enema (dye inserted in the large intestine, to better view it with  X-rays). °· Colonoscopy (looking in intestine with a lighted tube). °· Laparoscopy (minor surgery, looking in abdomen with a lighted tube). °· Major abdominal exploratory surgery (looking in abdomen with a large incision). °TREATMENT  °The treatment will depend on the cause of the pain.  °· Many cases can be observed and treated at home. °· Over-the-counter medicines recommended by your caregiver. °· Prescription medicine. °· Antibiotics, for infection. °· Birth control pills, for painful periods or for ovulation pain. °· Hormone treatment, for endometriosis. °· Nerve blocking injections. °· Physical therapy. °· Antidepressants. °· Counseling with a psychologist or psychiatrist. °· Minor or major surgery. °HOME CARE INSTRUCTIONS  °· Do not take laxatives, unless directed by your caregiver. °· Take over-the-counter pain medicine only if ordered by your caregiver. Do not take aspirin because it can cause an upset stomach or bleeding. °· Try a clear liquid diet (broth or water) as ordered by your caregiver. Slowly move to a bland diet, as tolerated, if the pain is related to the stomach or intestine. °· Have a thermometer and take your temperature several times a day, and record it. °· Bed rest and sleep, if it helps the pain. °· Avoid sexual intercourse, if it causes pain. °· Avoid stressful situations. °· Keep your follow-up appointments and tests, as your caregiver orders. °· If the pain does not go away with medicine or surgery, you may try: °¨ Acupuncture. °¨ Relaxation exercises (yoga, meditation). °¨ Group therapy. °¨ Counseling. °SEEK MEDICAL CARE IF:  °· You notice certain foods cause stomach pain. °· Your home care treatment is not helping your pain. °· You need stronger pain medicine. °· You want your IUD removed. °· You feel faint or   lightheaded. °· You develop nausea and vomiting. °· You develop a rash. °· You are having side effects or an allergy to your medicine. °SEEK IMMEDIATE MEDICAL CARE IF:  °· Your  pain does not go away or gets worse. °· You have a fever. °· Your pain is felt only in portions of the abdomen. The right side could possibly be appendicitis. The left lower portion of the abdomen could be colitis or diverticulitis. °· You are passing blood in your stools (bright red or black tarry stools, with or without vomiting). °· You have blood in your urine. °· You develop chills, with or without a fever. °· You pass out. °MAKE SURE YOU:  °· Understand these instructions. °· Will watch your condition. °· Will get help right away if you are not doing well or get worse. °Document Released: 03/30/2007 Document Revised: 10/17/2013 Document Reviewed: 04/19/2009 °Exit[employer]® Patient Information ©2015 [employer]. This information is not intended to replace advice given to you by your health care provider. Make sure you discuss any questions you have with your health care provider. ° °

## 2014-10-04 LAB — URINE CULTURE
COLONY COUNT: NO GROWTH
CULTURE: NO GROWTH
SPECIAL REQUESTS: NORMAL

## 2014-10-04 LAB — GC/CHLAMYDIA PROBE AMP (~~LOC~~) NOT AT ARMC
Chlamydia: NEGATIVE
NEISSERIA GONORRHEA: NEGATIVE

## 2015-09-17 ENCOUNTER — Ambulatory Visit (HOSPITAL_COMMUNITY): Payer: 59 | Admitting: Clinical

## 2015-09-25 ENCOUNTER — Encounter (HOSPITAL_COMMUNITY): Payer: Self-pay | Admitting: *Deleted

## 2015-09-25 ENCOUNTER — Emergency Department (HOSPITAL_COMMUNITY)
Admission: EM | Admit: 2015-09-25 | Discharge: 2015-09-25 | Disposition: A | Discharge status: Home / Self Care | Payer: 59 | Attending: Emergency Medicine | Admitting: Emergency Medicine

## 2015-09-25 ENCOUNTER — Emergency Department (HOSPITAL_COMMUNITY): Payer: 59

## 2015-09-25 DIAGNOSIS — Z79899 Other long term (current) drug therapy: Secondary | ICD-10-CM | POA: Insufficient documentation

## 2015-09-25 DIAGNOSIS — R1084 Generalized abdominal pain: Secondary | ICD-10-CM

## 2015-09-25 DIAGNOSIS — Z3202 Encounter for pregnancy test, result negative: Secondary | ICD-10-CM | POA: Diagnosis not present

## 2015-09-25 DIAGNOSIS — F17200 Nicotine dependence, unspecified, uncomplicated: Secondary | ICD-10-CM | POA: Insufficient documentation

## 2015-09-25 DIAGNOSIS — Z8673 Personal history of transient ischemic attack (TIA), and cerebral infarction without residual deficits: Secondary | ICD-10-CM | POA: Insufficient documentation

## 2015-09-25 DIAGNOSIS — R109 Unspecified abdominal pain: Secondary | ICD-10-CM | POA: Diagnosis present

## 2015-09-25 LAB — COMPREHENSIVE METABOLIC PANEL
ALT: 8 U/L — AB (ref 14–54)
AST: 12 U/L — AB (ref 15–41)
Albumin: 3.6 g/dL (ref 3.5–5.0)
Alkaline Phosphatase: 37 U/L — ABNORMAL LOW (ref 38–126)
Anion gap: 13 (ref 5–15)
BUN: 9 mg/dL (ref 6–20)
CALCIUM: 8.8 mg/dL — AB (ref 8.9–10.3)
CHLORIDE: 108 mmol/L (ref 101–111)
CO2: 19 mmol/L — ABNORMAL LOW (ref 22–32)
CREATININE: 0.78 mg/dL (ref 0.44–1.00)
Glucose, Bld: 140 mg/dL — ABNORMAL HIGH (ref 65–99)
Potassium: 3.3 mmol/L — ABNORMAL LOW (ref 3.5–5.1)
Sodium: 140 mmol/L (ref 135–145)
Total Bilirubin: 1.2 mg/dL (ref 0.3–1.2)
Total Protein: 6.7 g/dL (ref 6.5–8.1)

## 2015-09-25 LAB — URINALYSIS, ROUTINE W REFLEX MICROSCOPIC
Glucose, UA: NEGATIVE mg/dL
HGB URINE DIPSTICK: NEGATIVE
KETONES UR: 15 mg/dL — AB
Nitrite: NEGATIVE
PROTEIN: NEGATIVE mg/dL
Specific Gravity, Urine: 1.03 (ref 1.005–1.030)
pH: 5.5 (ref 5.0–8.0)

## 2015-09-25 LAB — CBC
HCT: 39.7 % (ref 36.0–46.0)
Hemoglobin: 12.9 g/dL (ref 12.0–15.0)
MCH: 29.9 pg (ref 26.0–34.0)
MCHC: 32.5 g/dL (ref 30.0–36.0)
MCV: 91.9 fL (ref 78.0–100.0)
PLATELETS: 350 10*3/uL (ref 150–400)
RBC: 4.32 MIL/uL (ref 3.87–5.11)
RDW: 14.7 % (ref 11.5–15.5)
WBC: 4.4 10*3/uL (ref 4.0–10.5)

## 2015-09-25 LAB — URINE MICROSCOPIC-ADD ON: RBC / HPF: NONE SEEN RBC/hpf (ref 0–5)

## 2015-09-25 LAB — I-STAT BETA HCG BLOOD, ED (MC, WL, AP ONLY)

## 2015-09-25 LAB — LIPASE, BLOOD: LIPASE: 26 U/L (ref 11–51)

## 2015-09-25 MED ORDER — MORPHINE SULFATE (PF) 4 MG/ML IV SOLN: 4.0000 mg | Freq: Once | INTRAVENOUS | Status: DC

## 2015-09-25 MED ORDER — TRAMADOL HCL 50 MG PO TABS: 50.0000 mg | ORAL_TABLET | Freq: Four times a day (QID) | ORAL | Status: DC | PRN

## 2015-09-25 MED ORDER — SODIUM CHLORIDE 0.9 % IV BOLUS (SEPSIS)
1000.0000 mL | Freq: Once | INTRAVENOUS | Status: AC
  Administered 2015-09-25: 1000 mL via INTRAVENOUS

## 2015-09-25 MED ORDER — LOPERAMIDE HCL 2 MG PO CAPS: 2.0000 mg | ORAL_CAPSULE | Freq: Four times a day (QID) | ORAL | Status: DC | PRN

## 2015-09-25 MED ORDER — IOPAMIDOL (ISOVUE-300) INJECTION 61%
INTRAVENOUS | Status: AC
  Administered 2015-09-25: 75 mL
  Filled 2015-09-25: qty 100

## 2015-09-25 MED ORDER — FENTANYL CITRATE (PF) 100 MCG/2ML IJ SOLN
50.0000 ug | Freq: Once | INTRAMUSCULAR | Status: AC
  Administered 2015-09-25: 50 ug via INTRAVENOUS
  Filled 2015-09-25: qty 2

## 2015-09-25 NOTE — ED Notes (Signed)
Patient transported to CT 

## 2015-09-25 NOTE — ED Provider Notes (Signed)
CSN: [id number]     Arrival date & time 09/25/15  [id number] History   First MD Initiated Contact with Patient 09/25/15 0740     Chief Complaint  Patient presents with  . Abdominal Pain     (Consider location/radiation/quality/duration/timing/severity/associated sxs/prior Treatment) HPI Patient presents to the emergency department with abdominal pain since this past Sunday.  She also reports having diarrhea.  The patient states that she did not take any medications prior to arrival.  She states that nothing seems to make the condition better, but eating and palpation make the pain worse Patient denies any recent antibiotic usage. The patient denies chest pain, shortness of breath, headache,blurred vision, neck pain, fever, cough, weakness, numbness, dizziness, anorexia, edema, abdominal pain, nausea, vomiting, rash, back pain, dysuria, hematemesis, bloody stool, near syncope, or syncope. Past Medical History  Diagnosis Date  . Seizures ([place])   . Syncope   . Stroke [facility])    Past Surgical History  Procedure Laterality Date  . Appendectomy    . Cesarean section     No family history on file. Social History  Substance Use Topics  . Smoking status: Current Every Day Smoker -- 0.50 packs/day  . Smokeless tobacco: None  . Alcohol Use: Yes   OB History    No data available     Review of Systems  All other systems negative except as documented in the HPI. All pertinent positives and negatives as reviewed in the HPI.   Allergies  Review of patient's allergies indicates no known allergies.  Home Medications   Prior to Admission medications   Medication Sig Start Date End Date Taking? Authorizing Provider  ALPRAZolam Duanne Moron) 1 MG tablet Take 1 mg by mouth daily as needed for anxiety.  07/28/14   Historical Provider, MD  cetirizine (ZYRTEC) 10 MG tablet Take 10 mg by mouth daily as needed for allergies.    Historical Provider, MD  HYDROcodone-acetaminophen (NORCO/VICODIN) 5-325 MG per  tablet Take 1 tablet by mouth every 6 (six) hours as needed for moderate pain. Patient not taking: Reported on 09/25/2015 10/03/14   Evelina Bucy, MD  medroxyPROGESTERone (DEPO-PROVERA) 150 MG/ML injection Inject 150 mg into the muscle every 3 (three) months.    Historical Provider, MD  PRISTIQ 50 MG 24 hr tablet Take 50 mg by mouth at bedtime.  08/05/14   Historical Provider, MD  tetrahydrozoline 0.05 % ophthalmic solution Place 2 drops into both eyes daily.    Historical Provider, MD   BP 96/75 mmHg  Pulse 77  Temp(Src) 98.1 F (36.7 C) (Oral)  Resp 12  SpO2 98% Physical Exam  Constitutional: She is oriented to person, place, and time. She appears well-developed and well-nourished. No distress.  HENT:  Head: Normocephalic and atraumatic.  Mouth/Throat: Oropharynx is clear and moist.  Eyes: Pupils are equal, round, and reactive to light.  Neck: Normal range of motion. Neck supple.  Cardiovascular: Normal rate, regular rhythm and normal heart sounds.  Exam reveals no gallop and no friction rub.   No murmur heard. Pulmonary/Chest: Effort normal and breath sounds normal. No respiratory distress. She has no wheezes.  Abdominal: Soft. Bowel sounds are normal. She exhibits no distension. There is tenderness. There is no rebound and no guarding.  Neurological: She is alert and oriented to person, place, and time. She exhibits normal muscle tone. Coordination normal.  Skin: Skin is warm and dry. No rash noted. No erythema.  Psychiatric: She has a normal mood and affect. Her behavior is normal.  Nursing note and vitals reviewed.   ED Course  Procedures (including critical care time) Labs Review Labs Reviewed  COMPREHENSIVE METABOLIC PANEL - Abnormal; Notable for the following:    Potassium 3.3 (*)    CO2 19 (*)    Glucose, Bld 140 (*)    Calcium 8.8 (*)    AST 12 (*)    ALT 8 (*)    Alkaline Phosphatase 37 (*)    All other components within normal limits  URINALYSIS, ROUTINE W REFLEX  MICROSCOPIC (NOT AT [facility]) - Abnormal; Notable for the following:    Color, Urine AMBER (*)    APPearance TURBID (*)    Bilirubin Urine SMALL (*)    Ketones, ur 15 (*)    Leukocytes, UA SMALL (*)    All other components within normal limits  URINE MICROSCOPIC-ADD ON - Abnormal; Notable for the following:    Squamous Epithelial / LPF TOO NUMEROUS TO COUNT (*)    Bacteria, UA MANY (*)    All other components within normal limits  LIPASE, BLOOD  CBC  I-STAT BETA HCG BLOOD, ED ([facility], [facility], [facility] ONLY)    Imaging Review Ct Abdomen Pelvis W Contrast  09/25/2015  CLINICAL DATA:  40 year old female with lower abdominal pain and diarrhea for 3 days. Initial encounter. Personal history of appendectomy. EXAM: CT ABDOMEN AND PELVIS WITH CONTRAST TECHNIQUE: Multidetector CT imaging of the abdomen and pelvis was performed using the standard protocol following bolus administration of intravenous contrast. CONTRAST:  1 ISOVUE-300 IOPAMIDOL (ISOVUE-300) INJECTION 61% COMPARISON:  CT Abdomen and Pelvis 10/03/2014 FINDINGS: Mild dependent atelectasis at the lung bases appears stable. No pericardial or pleural effusion. No acute osseous abnormality identified. Diminutive urinary bladder. No pelvic free fluid identified. Stable and negative uterus and adnexa. Mildly featureless appearance of the rectum where mild wall thickening is difficult to exclude. Redundant sigmoid colon is intermittently gas distended and does not appear inflamed. Similar appearance of the left colon. Redundant splenic flexure. Negative transverse colon. Redundant but otherwise negative right colon. Intermittent gas and fluid distended small bowel loops in the lower abdomen and pelvis. Decompressed terminal ileum. Some of the fluid-filled small bowel loops may have mildly hyper enhancing walls. There is no transition point identified. Proximal small bowel is decompressed, as is the stomach and duodenum. No abdominal free air or free fluid. Negative  liver, gallbladder, spleen, pancreas and adrenal glands. Portal venous system is patent. Mild calcified aortic atherosclerosis. Major arterial structures are patent. Bilateral renal enhancement and contrast excretion appears normal. There is a 2 mm nonobstructing right renal calculus in the midpole identified on series 2, image 33. No lymphadenopathy. No focal mesenteric stranding identified. IMPRESSION: 1. Intermittent mildly dilated small bowel in the abdomen or pelvis, such as due to inflammatory ileus. There is no transition point or strong evidence of a mechanical bowel obstruction. No associated free fluid or free air. 2. No other acute or inflammatory process identified in the abdomen or pelvis. 3. Nonobstructing right nephrolithiasis. Mild calcified aortic atherosclerosis. Electronically Signed   By: Genevie Ann M.D.   On: 09/25/2015 10:01   I have personally reviewed and evaluated these images and lab results as part of my medical decision-making.  \ Patient will be referred to GI.  Told to return here as needed.  The patient has some areas on CT scan that most likely represent an inflammatory process, but there is no signs of bowel obstruction at this time.  The patient is advised to return here  for any worsening in her condition.  Patient agrees the plan.  All questions were answered   Dalia Heading, PA-C 09/25/15 [place], MD 09/26/15 [phone]

## 2015-09-25 NOTE — ED Notes (Signed)
Pt is here with abdominal pain since Sunday and reports loose stool, no blood.  Denies vomiting.

## 2015-09-25 NOTE — Discharge Instructions (Signed)
Return here as needed.  Follow-up with the GI Dr. provided.  Your CT scan did not show any significant abnormalities.  However, there was some inflammation noted in your intestines, which could go along with the diarrhea

## 2016-03-20 ENCOUNTER — Encounter (HOSPITAL_COMMUNITY): Payer: Self-pay

## 2016-03-20 ENCOUNTER — Emergency Department (HOSPITAL_COMMUNITY): Payer: 59

## 2016-03-20 ENCOUNTER — Emergency Department (HOSPITAL_COMMUNITY)
Admission: EM | Admit: 2016-03-20 | Discharge: 2016-03-20 | Disposition: A | Discharge status: Home / Self Care | Payer: 59 | Attending: Emergency Medicine | Admitting: Emergency Medicine

## 2016-03-20 DIAGNOSIS — S20211A Contusion of right front wall of thorax, initial encounter: Secondary | ICD-10-CM | POA: Diagnosis not present

## 2016-03-20 DIAGNOSIS — Y929 Unspecified place or not applicable: Secondary | ICD-10-CM | POA: Insufficient documentation

## 2016-03-20 DIAGNOSIS — S299XXA Unspecified injury of thorax, initial encounter: Secondary | ICD-10-CM | POA: Diagnosis present

## 2016-03-20 DIAGNOSIS — Y999 Unspecified external cause status: Secondary | ICD-10-CM | POA: Diagnosis not present

## 2016-03-20 DIAGNOSIS — W0110XA Fall on same level from slipping, tripping and stumbling with subsequent striking against unspecified object, initial encounter: Secondary | ICD-10-CM | POA: Insufficient documentation

## 2016-03-20 DIAGNOSIS — Y939 Activity, unspecified: Secondary | ICD-10-CM | POA: Diagnosis not present

## 2016-03-20 DIAGNOSIS — R0781 Pleurodynia: Secondary | ICD-10-CM

## 2016-03-20 DIAGNOSIS — F17200 Nicotine dependence, unspecified, uncomplicated: Secondary | ICD-10-CM | POA: Insufficient documentation

## 2016-03-20 DIAGNOSIS — Z8673 Personal history of transient ischemic attack (TIA), and cerebral infarction without residual deficits: Secondary | ICD-10-CM | POA: Diagnosis not present

## 2016-03-20 MED ORDER — HYDROCODONE-ACETAMINOPHEN 5-325 MG PO TABS: 1.0000 | ORAL_TABLET | ORAL | 0 refills | Status: DC | PRN

## 2016-03-20 MED ORDER — IBUPROFEN 600 MG PO TABS: 600.0000 mg | ORAL_TABLET | Freq: Four times a day (QID) | ORAL | 0 refills | Status: DC | PRN

## 2016-03-20 MED ORDER — OXYCODONE-ACETAMINOPHEN 5-325 MG PO TABS
2.0000 | ORAL_TABLET | Freq: Once | ORAL | Status: AC
  Administered 2016-03-20: 2 via ORAL
  Filled 2016-03-20: qty 2
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
## 2016-07-05 ENCOUNTER — Encounter (HOSPITAL_COMMUNITY): Payer: Self-pay

## 2016-07-05 ENCOUNTER — Ambulatory Visit (HOSPITAL_COMMUNITY)
Admission: EM | Admit: 2016-07-05 | Discharge: 2016-07-05 | Disposition: A | Discharge status: Home / Self Care | Payer: 59 | Attending: Family Medicine | Admitting: Family Medicine

## 2016-07-05 DIAGNOSIS — L03315 Cellulitis of perineum: Secondary | ICD-10-CM

## 2016-07-05 MED ORDER — DOXYCYCLINE HYCLATE 100 MG PO TABS: 100.0000 mg | ORAL_TABLET | Freq: Two times a day (BID) | ORAL | 0 refills | Status: DC

## 2016-07-05 MED ORDER — TRAMADOL HCL 50 MG PO TABS: 50.0000 mg | ORAL_TABLET | Freq: Four times a day (QID) | ORAL | 0 refills | Status: DC | PRN

## 2016-07-05 NOTE — Discharge Instructions (Signed)
Please apply warm compresses to the area of infection. If the swelling increases, pain increases, then please return so we can recheck this area.

## 2016-07-05 NOTE — ED Provider Notes (Signed)
[facility]    CSN: [id number] Arrival date & time: 07/05/16  1547     History   Chief Complaint Chief Complaint  Patient presents with  . Recurrent Skin Infections    HPI Penny Berger is a 41 y.o. female.   This a 41 year old woman who works at [employer]. She's had 4 days of pain in her left perineum and had some drainage and foul odor as well.  She's had the same problem in the past when she's shaved the perineum.      Past Medical History:  Diagnosis Date  . Seizures ([place])   . Stroke ([place])   . Syncope     There are no active problems to display for this patient.   Past Surgical History:  Procedure Laterality Date  . APPENDECTOMY    . CESAREAN SECTION      OB History    No data available       Home Medications    Prior to Admission medications   Medication Sig Start Date End Date Taking? Authorizing Provider  doxycycline (VIBRA-TABS) 100 MG tablet Take 1 tablet (100 mg total) by mouth 2 (two) times daily. 07/05/16   Robyn Haber, MD  ibuprofen (ADVIL,MOTRIN) 600 MG tablet Take 1 tablet (600 mg total) by mouth every 6 (six) hours as needed. 03/20/16   Malvin Johns, MD  medroxyPROGESTERone (DEPO-PROVERA) 150 MG/ML injection Inject 150 mg into the muscle every 3 (three) months.    Historical Provider, MD  traMADol (ULTRAM) 50 MG tablet Take 1 tablet (50 mg total) by mouth every 6 (six) hours as needed. 07/05/16   Robyn Haber, MD    Family History History reviewed. No pertinent family history.  Social History Social History  Substance Use Topics  . Smoking status: Current Every Day Smoker    Packs/day: 0.50  . Smokeless tobacco: Never Used  . Alcohol use Yes     Allergies   Patient has no known allergies.   Review of Systems Review of Systems  Constitutional: Negative.   HENT: Negative.   Respiratory: Negative.   Skin: Positive for wound.     Physical Exam Triage Vital Signs ED Triage Vitals  [07/05/16 1739]  Enc Vitals Group     BP      Pulse      Resp      Temp      Temp src      SpO2      Weight      Height      Head Circumference      Peak Flow      Pain Score 10     Pain Loc      Pain Edu?      Excl. in [place]?    No data found.   Updated Vital Signs BP 121/85 (BP Location: Right Arm)   Pulse 86   Temp 98.7 F (37.1 C) (Oral)   Resp 17   SpO2 100%    Physical Exam  Constitutional: She is oriented to person, place, and time. She appears well-developed and well-nourished.  HENT:  Right Ear: External ear normal.  Left Ear: External ear normal.  Mouth/Throat: Oropharynx is clear and moist.  Eyes: Conjunctivae and EOM are normal. Pupils are equal, round, and reactive to light.  Neck: Normal range of motion. Neck supple.  Cardiovascular: Normal rate, regular rhythm and normal heart sounds.   Pulmonary/Chest: Effort normal.  Musculoskeletal: Normal range of motion.  Neurological: She is alert and oriented to person, place, and time.  Skin: Skin is warm and dry.  Patient has a 1 cm area of denuded skin in her (him but I do not feel any induration or fluctuance. She is tender and mildly swollen in that area however.  Nursing note and vitals reviewed.    UC Treatments / Results  Labs (all labs ordered are listed, but only abnormal results are displayed) Labs Reviewed - No data to display  EKG  EKG Interpretation None       Radiology No results found.  Procedures Procedures (including critical care time)  Medications Ordered in UC Medications - No data to display   Initial Impression / Assessment and Plan / UC Course  I have reviewed the triage vital signs and the nursing notes.  Pertinent labs & imaging results that were available during my care of the patient were reviewed by me and considered in my medical decision making (see chart for details).     Final Clinical Impressions(s) / UC Diagnoses   Final diagnoses:  Cellulitis of  perineum    New Prescriptions New Prescriptions   DOXYCYCLINE (VIBRA-TABS) 100 MG TABLET    Take 1 tablet (100 mg total) by mouth 2 (two) times daily.   TRAMADOL (ULTRAM) 50 MG TABLET    Take 1 tablet (50 mg total) by mouth every 6 (six) hours as needed.     Robyn Haber, MD 07/05/16 [phone]
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
## 2017-02-22 ENCOUNTER — Emergency Department (HOSPITAL_COMMUNITY): Payer: 59

## 2017-02-22 ENCOUNTER — Encounter (HOSPITAL_COMMUNITY): Payer: Self-pay | Admitting: Emergency Medicine

## 2017-02-22 DIAGNOSIS — Z79899 Other long term (current) drug therapy: Secondary | ICD-10-CM | POA: Insufficient documentation

## 2017-02-22 DIAGNOSIS — R079 Chest pain, unspecified: Secondary | ICD-10-CM | POA: Diagnosis not present

## 2017-02-22 DIAGNOSIS — Z8673 Personal history of transient ischemic attack (TIA), and cerebral infarction without residual deficits: Secondary | ICD-10-CM | POA: Insufficient documentation

## 2017-02-22 DIAGNOSIS — F17200 Nicotine dependence, unspecified, uncomplicated: Secondary | ICD-10-CM | POA: Insufficient documentation

## 2017-02-22 DIAGNOSIS — R0602 Shortness of breath: Secondary | ICD-10-CM | POA: Diagnosis present

## 2017-02-22 LAB — BASIC METABOLIC PANEL
Anion gap: 9 (ref 5–15)
BUN: 9 mg/dL (ref 6–20)
CALCIUM: 8.7 mg/dL — AB (ref 8.9–10.3)
CO2: 23 mmol/L (ref 22–32)
CREATININE: 0.95 mg/dL (ref 0.44–1.00)
Chloride: 105 mmol/L (ref 101–111)
Glucose, Bld: 104 mg/dL — ABNORMAL HIGH (ref 65–99)
Potassium: 3.2 mmol/L — ABNORMAL LOW (ref 3.5–5.1)
SODIUM: 137 mmol/L (ref 135–145)

## 2017-02-22 LAB — CBC
HEMATOCRIT: 39.1 % (ref 36.0–46.0)
Hemoglobin: 13 g/dL (ref 12.0–15.0)
MCH: 30.1 pg (ref 26.0–34.0)
MCHC: 33.2 g/dL (ref 30.0–36.0)
MCV: 90.5 fL (ref 78.0–100.0)
PLATELETS: 375 10*3/uL (ref 150–400)
RBC: 4.32 MIL/uL (ref 3.87–5.11)
RDW: 15.1 % (ref 11.5–15.5)
WBC: 5.8 10*3/uL (ref 4.0–10.5)

## 2017-02-22 LAB — I-STAT TROPONIN, ED: Troponin i, poc: 0 ng/mL (ref 0.00–0.08)

## 2017-02-22 NOTE — ED Triage Notes (Signed)
Reports having two "seizure" like episodes today.  Per spouse noted shaking all over that lasted 20-30 seconds. Per spouse Patient alert and normal afterwards.  Also endorsing chest pressure that started today.

## 2017-02-23 ENCOUNTER — Emergency Department (HOSPITAL_COMMUNITY)
Admission: EM | Admit: 2017-02-23 | Discharge: 2017-02-23 | Disposition: A | Discharge status: Home / Self Care | Payer: 59 | Attending: Emergency Medicine | Admitting: Emergency Medicine

## 2017-02-23 DIAGNOSIS — R079 Chest pain, unspecified: Secondary | ICD-10-CM

## 2017-02-23 LAB — I-STAT TROPONIN, ED: Troponin i, poc: 0 ng/mL (ref 0.00–0.08)

## 2017-02-23 MED ORDER — LORAZEPAM 1 MG PO TABS
1.0000 mg | ORAL_TABLET | Freq: Once | ORAL | Status: AC
  Administered 2017-02-23: 1 mg via ORAL
  Filled 2017-02-23: qty 1

## 2017-02-23 MED ORDER — MORPHINE SULFATE (PF) 4 MG/ML IV SOLN
4.0000 mg | Freq: Once | INTRAVENOUS | Status: AC
  Administered 2017-02-23: 4 mg via INTRAVENOUS
  Filled 2017-02-23: qty 1

## 2017-02-23 MED ORDER — POTASSIUM CHLORIDE CRYS ER 20 MEQ PO TBCR
40.0000 meq | EXTENDED_RELEASE_TABLET | Freq: Once | ORAL | Status: AC
  Administered 2017-02-23: 40 meq via ORAL
  Filled 2017-02-23: qty 2

## 2017-02-23 NOTE — ED Provider Notes (Signed)
[facility] DEPT Provider Note   CSN: [id number] Arrival date & time: 02/22/17  2056     History   Chief Complaint Chief Complaint  Patient presents with  . Shortness of Breath  . Chest Pain  . Seizures    HPI Penny Berger is a 41 y.o. female.  Patient presents to the emergency department with chief complaint of shortness of breath and chest tightness sensation. She states that she began experiencing the symptoms earlier this morning. She states that she feels anxious, and like she is going to have a panic attack. She has experienced these symptoms before, and states that she has shaking episodes, which she describes as "seizures." However, she denies any loss of consciousness or postictal phase. She states that her shaking episodes/seizures are brought on by her anxiety. She denies any history of heart problems. She has not taken anything for symptoms. She does not have radiating pain or diaphoresis. Denies any abdominal complaints, nausea, vomiting, or diarrhea. There are no other associated symptoms.   The history is provided by the patient. No language interpreter was used.    Past Medical History:  Diagnosis Date  . Seizures ([place])   . Stroke ([place])   . Syncope     There are no active problems to display for this patient.   Past Surgical History:  Procedure Laterality Date  . APPENDECTOMY    . CESAREAN SECTION      OB History    No data available       Home Medications    Prior to Admission medications   Medication Sig Start Date End Date Taking? Authorizing Provider  doxycycline (VIBRA-TABS) 100 MG tablet Take 1 tablet (100 mg total) by mouth 2 (two) times daily. 07/05/16   Robyn Haber, MD  ibuprofen (ADVIL,MOTRIN) 600 MG tablet Take 1 tablet (600 mg total) by mouth every 6 (six) hours as needed. 03/20/16   Malvin Johns, MD  medroxyPROGESTERone (DEPO-PROVERA) 150 MG/ML injection Inject 150 mg into the muscle every 3 (three) months.    [provider]  traMADol (ULTRAM) 50 MG tablet Take 1 tablet (50 mg total) by mouth every 6 (six) hours as needed. 07/05/16   Robyn Haber, MD    Family History No family history on file.  Social History Social History  Substance Use Topics  . Smoking status: Current Every Day Smoker    Packs/day: 0.50  . Smokeless tobacco: Never Used  . Alcohol use Yes     Allergies   Patient has no known allergies.   Review of Systems Review of Systems  All other systems reviewed and are negative.    Physical Exam Updated Vital Signs BP 111/82   Pulse 83   Temp 98.2 F (36.8 C) (Oral)   Resp 16   Ht 5\' 5"  (1.651 m)   Wt 68 kg (150 lb)   SpO2 98%   BMI 24.96 kg/m   Physical Exam  Constitutional: She is oriented to person, place, and time. She appears well-developed and well-nourished.  HENT:  Head: Normocephalic and atraumatic.  Eyes: Pupils are equal, round, and reactive to light. Conjunctivae and EOM are normal.  Neck: Normal range of motion. Neck supple.  Cardiovascular: Normal rate and regular rhythm.  Exam reveals no gallop and no friction rub.   No murmur heard. Pulmonary/Chest: Effort normal and breath sounds normal. No respiratory distress. She has no wheezes. She has no rales. She exhibits no tenderness.  Abdominal: Soft. Bowel sounds are normal.  She exhibits no distension and no mass. There is no tenderness. There is no rebound and no guarding.  Musculoskeletal: Normal range of motion. She exhibits no edema or tenderness.  Neurological: She is alert and oriented to person, place, and time.  Skin: Skin is warm and dry.  Psychiatric: She has a normal mood and affect. Her behavior is normal. Judgment and thought content normal.  Nursing note and vitals reviewed.    ED Treatments / Results  Labs (all labs ordered are listed, but only abnormal results are displayed) Labs Reviewed  BASIC METABOLIC PANEL - Abnormal; Notable for the following:       Result Value    Potassium 3.2 (*)    Glucose, Bld 104 (*)    Calcium 8.7 (*)    All other components within normal limits  CBC  I-STAT TROPONIN, ED    EKG  EKG Interpretation None       Radiology Dg Chest 2 View  Result Date: 02/22/2017 CLINICAL DATA:  Chest pain and tightness tonight. Shortness of breath. EXAM: CHEST  2 VIEW COMPARISON:  Radiograph 03/20/2016 FINDINGS: The cardiomediastinal contours are normal. Mild bronchial thickening. Pulmonary vasculature is normal. No consolidation, pleural effusion, or pneumothorax. No acute osseous abnormalities are seen. IMPRESSION: Mild bronchial thickening which may reflect bronchitis, asthma, or smoking related lung disease. Electronically Signed   By: Jeb Levering M.D.   On: 02/22/2017 22:10    Procedures Procedures (including critical care time)  Medications Ordered in ED Medications  LORazepam (ATIVAN) tablet 1 mg (1 mg Oral Given 02/23/17 0250)     Initial Impression / Assessment and Plan / ED Course  I have reviewed the triage vital signs and the nursing notes.  Pertinent labs & imaging results that were available during my care of the patient were reviewed by me and considered in my medical decision making (see chart for details).     Patient with chest tightness and shortness of breath. States that she has anxiety, and felt like she was having a panic attack stay. She states that she has full-body shaking when she has severe anxiety. She describes these episodes as seizures, but they do not sound like true seizure given that she does not lose consciousness, and she is alert and oriented during the shaking spells. I believe that she could be having a panic attack during these episodes. This could also be precipitating her chest tightness and shortness of breath symptoms. Her laboratory workup is reassuring here today. Her troponin is negative. EKG shows sinus tachycardia, however her heart rate is normal now. She is low risk for PE or DVT.  Her well's score is 1.5, Putting her in the low risk chance of PE in the ED population. Her chest x-ray and labs are otherwise normal. Will give the patient some Ativan and will reassess.  Patient reassessed.  Feeling well.  Repeat troponin is normal.  HEART score is 3. Recommend outpatient follow-up.  Patient understands and agrees with the plan.  Final Clinical Impressions(s) / ED Diagnoses   Final diagnoses:  Chest pain, unspecified type    New Prescriptions New Prescriptions   No medications on file     Montine Circle, Hershal Coria 02/23/17 [place], [place], MD 02/26/17 1559

## 2017-02-23 NOTE — ED Notes (Signed)
PT UNDERSTOOD DC MATERIAL. NAD NOTED

## 2017-09-17 ENCOUNTER — Encounter (HOSPITAL_COMMUNITY): Payer: Self-pay | Admitting: *Deleted

## 2017-09-17 ENCOUNTER — Emergency Department (HOSPITAL_COMMUNITY)
Admission: EM | Admit: 2017-09-17 | Discharge: 2017-09-17 | Disposition: A | Discharge status: Home / Self Care | Payer: 59 | Attending: Emergency Medicine | Admitting: Emergency Medicine

## 2017-09-17 DIAGNOSIS — A084 Viral intestinal infection, unspecified: Secondary | ICD-10-CM | POA: Insufficient documentation

## 2017-09-17 DIAGNOSIS — F17210 Nicotine dependence, cigarettes, uncomplicated: Secondary | ICD-10-CM | POA: Diagnosis not present

## 2017-09-17 DIAGNOSIS — Z79899 Other long term (current) drug therapy: Secondary | ICD-10-CM | POA: Insufficient documentation

## 2017-09-17 DIAGNOSIS — R112 Nausea with vomiting, unspecified: Secondary | ICD-10-CM | POA: Diagnosis present

## 2017-09-17 LAB — COMPREHENSIVE METABOLIC PANEL
ALBUMIN: 4.1 g/dL (ref 3.5–5.0)
ALK PHOS: 48 U/L (ref 38–126)
ALT: 11 U/L — ABNORMAL LOW (ref 14–54)
AST: 15 U/L (ref 15–41)
Anion gap: 13 (ref 5–15)
BILIRUBIN TOTAL: 1.2 mg/dL (ref 0.3–1.2)
BUN: 14 mg/dL (ref 6–20)
CALCIUM: 8.7 mg/dL — AB (ref 8.9–10.3)
CO2: 17 mmol/L — ABNORMAL LOW (ref 22–32)
Chloride: 109 mmol/L (ref 101–111)
Creatinine, Ser: 0.75 mg/dL (ref 0.44–1.00)
GFR calc Af Amer: >60 mL/min (ref >60)
GFR calc non Af Amer: >60 mL/min (ref >60)
GLUCOSE: 108 mg/dL — AB (ref 65–99)
Potassium: 3.6 mmol/L (ref 3.5–5.1)
Sodium: 139 mmol/L (ref 135–145)
TOTAL PROTEIN: 7.5 g/dL (ref 6.5–8.1)

## 2017-09-17 LAB — LIPASE, BLOOD: Lipase: 26 U/L (ref 11–51)

## 2017-09-17 LAB — CBC
HCT: 44.8 % (ref 36.0–46.0)
Hemoglobin: 15.1 g/dL — ABNORMAL HIGH (ref 12.0–15.0)
MCH: 30.9 pg (ref 26.0–34.0)
MCHC: 33.7 g/dL (ref 30.0–36.0)
MCV: 91.6 fL (ref 78.0–100.0)
Platelets: 386 10*3/uL (ref 150–400)
RBC: 4.89 MIL/uL (ref 3.87–5.11)
RDW: 14.5 % (ref 11.5–15.5)
WBC: 6 10*3/uL (ref 4.0–10.5)

## 2017-09-17 LAB — I-STAT BETA HCG BLOOD, ED (MC, WL, AP ONLY): I-stat hCG, quantitative: <5 m[IU]/mL (ref <5)

## 2017-09-17 MED ORDER — PROMETHAZINE HCL 25 MG PO TABS: 25.0000 mg | ORAL_TABLET | Freq: Four times a day (QID) | ORAL | 0 refills | Status: DC | PRN

## 2017-09-17 MED ORDER — ONDANSETRON 4 MG PO TBDP
4.0000 mg | ORAL_TABLET | Freq: Once | ORAL | Status: AC
  Administered 2017-09-17: 4 mg via ORAL
  Filled 2017-09-17: qty 1

## 2017-09-17 MED ORDER — ACETAMINOPHEN 325 MG PO TABS
650.0000 mg | ORAL_TABLET | Freq: Once | ORAL | Status: AC
  Administered 2017-09-17: 650 mg via ORAL
  Filled 2017-09-17: qty 2

## 2017-09-17 NOTE — ED Triage Notes (Signed)
Pt in c/o abd pain and n/v/d since last night, no distress noted

## 2017-09-17 NOTE — Discharge Instructions (Signed)
Please read attached information. If you experience any new or worsening signs or symptoms please return to the emergency room for evaluation. Please follow-up with your primary care provider or specialist as discussed. Please use medication prescribed only as directed and discontinue taking if you have any concerning signs or symptoms.   °

## 2017-09-17 NOTE — ED Notes (Signed)
Pt given urine specimen cup.

## 2017-09-17 NOTE — ED Provider Notes (Signed)
[facility] [facility] EMERGENCY DEPARTMENT Provider Note   CSN: [id number] Arrival date & time: 09/17/17  7408     History   Chief Complaint Chief Complaint  Patient presents with  . Abdominal Pain  . Emesis    HPI Penny Berger is a 42 y.o. female.  HPI   42 year old female presents today with complaints of nausea vomiting diarrhea.  Patient reports symptoms started last night with several episodes of nonbloody vomiting and diarrhea.  She notes generalized abdominal cramping, nonfocal.  She denies any fever.  She denies any close sick contacts.  Patient reports she is unable to tolerate p.o.  She denies any other complaints here today.  No abnormal food or drink.  No changes, denies any vaginal discharge or bleeding.  Past Medical History:  Diagnosis Date  . Seizures ([place])   . Stroke ([place])   . Syncope     There are no active problems to display for this patient.   Past Surgical History:  Procedure Laterality Date  . APPENDECTOMY    . CESAREAN SECTION       OB History   None      Home Medications    Prior to Admission medications   Medication Sig Start Date End Date Taking? Authorizing Provider  ARIPiprazole (ABILIFY) 5 MG tablet Take 5 mg by mouth every morning. 01/16/17   [provider]  medroxyPROGESTERone (DEPO-PROVERA) 150 MG/ML injection Inject 150 mg into the muscle every 3 (three) months.    [provider]  promethazine (PHENERGAN) 25 MG tablet Take 1 tablet (25 mg total) by mouth every 6 (six) hours as needed for nausea or vomiting. 09/17/17   Okey Regal, PA-C    Family History History reviewed. No pertinent family history.  Social History Social History   Tobacco Use  . Smoking status: Current Every Day Smoker    Packs/day: 0.50  . Smokeless tobacco: Never Used  Substance Use Topics  . Alcohol use: Yes  . Drug use: No     Allergies   Patient has no known allergies.   Review of Systems Review of  Systems  All other systems reviewed and are negative.    Physical Exam Updated Vital Signs BP (!) 112/58 (BP Location: Right Arm)   Pulse 84   Temp 98.9 F (37.2 C) (Oral)   Resp 20   SpO2 100%   Physical Exam  Constitutional: She is oriented to person, place, and time. She appears well-developed and well-nourished.  HENT:  Head: Normocephalic and atraumatic.  Eyes: Pupils are equal, round, and reactive to light. Conjunctivae are normal. Right eye exhibits no discharge. Left eye exhibits no discharge. No scleral icterus.  Neck: Normal range of motion. No JVD present. No tracheal deviation present.  Pulmonary/Chest: Effort normal.  Abdominal: Soft. She exhibits no distension and no mass. There is no tenderness. There is no rebound and no guarding. No hernia.  Generalized tenderness palpation nonfocal nonsevere  Neurological: She is alert and oriented to person, place, and time. Coordination normal.  Psychiatric: She has a normal mood and affect. Her behavior is normal. Judgment and thought content normal.  Nursing note and vitals reviewed.    ED Treatments / Results  Labs (all labs ordered are listed, but only abnormal results are displayed) Labs Reviewed  COMPREHENSIVE METABOLIC PANEL - Abnormal; Notable for the following components:      Result Value   CO2 17 (*)    Glucose, Bld 108 (*)  Calcium 8.7 (*)    ALT 11 (*)    All other components within normal limits  CBC - Abnormal; Notable for the following components:   Hemoglobin 15.1 (*)    All other components within normal limits  LIPASE, BLOOD  URINALYSIS, ROUTINE W REFLEX MICROSCOPIC  I-STAT BETA HCG BLOOD, ED ([facility], [facility], [facility] ONLY)    EKG None  Radiology No results found.  Procedures Procedures (including critical care time)  Medications Ordered in ED Medications  ondansetron (ZOFRAN-ODT) disintegrating tablet 4 mg (4 mg Oral Given 09/17/17 1402)  acetaminophen (TYLENOL) tablet 650 mg (650 mg Oral Given  09/17/17 1402)     Initial Impression / Assessment and Plan / ED Course  I have reviewed the triage vital signs and the nursing notes.  Pertinent labs & imaging results that were available during my care of the patient were reviewed by me and considered in my medical decision making (see chart for details).    Labs: I-STAT beta-hCG, lipase, CMP, CBC  Imaging:  Consults:  Therapeutics: Zofran  Discharge Meds: Promethazine  Assessment/Plan: 42 year old female presents today with likely viral gastroenteritis.  She is well-appearing no apparent distress.  Reassuring laboratory analysis, tolerating p.o.  Patient discharged with outpatient follow-up and strict return precautions.  Patient verbalized understanding and agreement to today's plan had no further questions or concerns.      Final Clinical Impressions(s) / ED Diagnoses   Final diagnoses:  Viral gastroenteritis    ED Discharge Orders        Ordered    promethazine (PHENERGAN) 25 MG tablet  Every 6 hours PRN     09/17/17 1448       Okey Regal, PA-C 09/17/17 1509    Sherwood Gambler, MD 09/17/17 2300

## 2018-01-20 ENCOUNTER — Encounter (HOSPITAL_COMMUNITY): Payer: Self-pay | Admitting: Emergency Medicine

## 2018-01-20 ENCOUNTER — Other Ambulatory Visit: Payer: Self-pay

## 2018-01-20 ENCOUNTER — Emergency Department (HOSPITAL_COMMUNITY)
Admission: EM | Admit: 2018-01-20 | Discharge: 2018-01-20 | Disposition: A | Discharge status: Home / Self Care | Payer: 59 | Attending: Emergency Medicine | Admitting: Emergency Medicine

## 2018-01-20 DIAGNOSIS — R519 Headache, unspecified: Secondary | ICD-10-CM

## 2018-01-20 DIAGNOSIS — F17210 Nicotine dependence, cigarettes, uncomplicated: Secondary | ICD-10-CM | POA: Insufficient documentation

## 2018-01-20 DIAGNOSIS — Z8673 Personal history of transient ischemic attack (TIA), and cerebral infarction without residual deficits: Secondary | ICD-10-CM | POA: Insufficient documentation

## 2018-01-20 DIAGNOSIS — R42 Dizziness and giddiness: Secondary | ICD-10-CM | POA: Insufficient documentation

## 2018-01-20 DIAGNOSIS — R51 Headache: Secondary | ICD-10-CM | POA: Insufficient documentation

## 2018-01-20 DIAGNOSIS — R110 Nausea: Secondary | ICD-10-CM | POA: Insufficient documentation

## 2018-01-20 MED ORDER — KETOROLAC TROMETHAMINE 30 MG/ML IJ SOLN
30.0000 mg | Freq: Once | INTRAMUSCULAR | Status: AC
  Administered 2018-01-20: 30 mg via INTRAVENOUS
  Filled 2018-01-20: qty 1

## 2018-01-20 MED ORDER — DIPHENHYDRAMINE HCL 50 MG/ML IJ SOLN
25.0000 mg | Freq: Once | INTRAMUSCULAR | Status: AC
  Administered 2018-01-20: 25 mg via INTRAVENOUS
  Filled 2018-01-20: qty 1

## 2018-01-20 MED ORDER — SODIUM CHLORIDE 0.9 % IV BOLUS
1000.0000 mL | Freq: Once | INTRAVENOUS | Status: AC
  Administered 2018-01-20: 1000 mL via INTRAVENOUS

## 2018-01-20 MED ORDER — IBUPROFEN 800 MG PO TABS: 800.0000 mg | ORAL_TABLET | Freq: Three times a day (TID) | ORAL | 0 refills | Status: AC

## 2018-01-20 MED ORDER — METOCLOPRAMIDE HCL 5 MG/ML IJ SOLN
10.0000 mg | Freq: Once | INTRAMUSCULAR | Status: AC
  Administered 2018-01-20: 10 mg via INTRAVENOUS
  Filled 2018-01-20: qty 2

## 2018-01-20 NOTE — Discharge Instructions (Addendum)
I'm glad the treatment today was able to get rid of your headache. I have written you a prescription for Ibuprofen 800mg  should the headache return.   If you continue to get these headaches and they become more frequent, my recommendation is for you to follow-up with a PCP who can talk to you about preventive medications.

## 2018-01-20 NOTE — ED Triage Notes (Signed)
Patient c/o intermittent headache and nausea x1 week. Reports "the room starts spinning if I turn my head too quick." Denies blurred vision and weakness.

## 2018-01-20 NOTE — ED Provider Notes (Signed)
[facility] DEPT Provider Note  CSN: [id number] Arrival date & time: 01/20/18  1421   History   Chief Complaint Chief Complaint  Patient presents with  . Nausea  . Headache   HPI Penny Berger is a 42 y.o. female with a medical history of seizures and stroke who presented to the ED for headache x1 week. Associated symptoms: dizziness and nausea. She describes frontal, throbbing headache which has been occurring on and off for the last week. Patient has tried Tylenol and caffeine prior to coming to the ED. Denies fever, neck pain, vision changes, paresthesias, weakness or gait/coordination/balance issues.  Past Medical History:  Diagnosis Date  . Seizures ([place])   . Stroke ([place])   . Syncope     There are no active problems to display for this patient.   Past Surgical History:  Procedure Laterality Date  . APPENDECTOMY    . CESAREAN SECTION       OB History   None      Home Medications    Prior to Admission medications   Medication Sig Start Date End Date Taking? Authorizing Provider  medroxyPROGESTERone (DEPO-PROVERA) 150 MG/ML injection Inject 150 mg into the muscle every 3 (three) months.   Yes [provider]  ibuprofen (ADVIL,MOTRIN) 800 MG tablet Take 1 tablet (800 mg total) by mouth 3 (three) times daily for 10 days. 01/20/18 01/30/18  Mortis, Jonelle Sports, PA-C  promethazine (PHENERGAN) 25 MG tablet Take 1 tablet (25 mg total) by mouth every 6 (six) hours as needed for nausea or vomiting. Patient not taking: Reported on 01/20/2018 09/17/17   Okey Regal, PA-C    Family History No family history on file.  Social History Social History   Tobacco Use  . Smoking status: Current Every Day Smoker    Packs/day: 0.50  . Smokeless tobacco: Never Used  Substance Use Topics  . Alcohol use: Yes  . Drug use: No     Allergies   Patient has no known allergies.   Review of Systems Review of Systems  Constitutional:  Negative for chills and fever.  Eyes: Negative for photophobia, pain and visual disturbance.  Musculoskeletal: Negative for neck pain and neck stiffness.  Skin: Negative.   Neurological: Positive for dizziness and headaches. Negative for syncope, weakness, light-headedness and numbness.  Psychiatric/Behavioral: Negative for confusion and decreased concentration.     Physical Exam Updated Vital Signs BP (!) 132/100 (BP Location: Left Arm)   Pulse 89   Temp 99 F (37.2 C) (Oral)   Resp 15   Ht 5\' 5"  (1.651 m)   Wt 72.1 kg (159 lb)   SpO2 99%   BMI 26.46 kg/m   Physical Exam  Constitutional: Vital signs are normal. She appears well-developed and well-nourished.  Eyes: Pupils are equal, round, and reactive to light. EOM are normal.  Neck: Normal range of motion. Neck supple. No neck rigidity. No Brudzinski's sign and no Kernig's sign noted.  Musculoskeletal: Normal range of motion. She exhibits no tenderness.  Neurological: She is alert. She has normal strength. She displays normal reflexes. No cranial nerve deficit or sensory deficit.  Reflex Scores:      Tricep reflexes are 2+ on the right side and 2+ on the left side.      Bicep reflexes are 2+ on the right side and 2+ on the left side.      Brachioradialis reflexes are 2+ on the right side and 2+ on the left side.  Patellar reflexes are 2+ on the right side and 2+ on the left side.      Achilles reflexes are 2+ on the right side and 2+ on the left side. Skin: Skin is warm. No rash noted.  Nursing note and vitals reviewed.    ED Treatments / Results  Labs (all labs ordered are listed, but only abnormal results are displayed) Labs Reviewed - No data to display  EKG None  Radiology No results found.  Procedures Procedures (including critical care time)  Medications Ordered in ED Medications  sodium chloride 0.9 % bolus 1,000 mL ( Intravenous Stopped 01/20/18 2121)  metoCLOPramide (REGLAN) injection 10 mg (10 mg  Intravenous Given 01/20/18 1955)  ketorolac (TORADOL) 30 MG/ML injection 30 mg (30 mg Intravenous Given 01/20/18 1957)  diphenhydrAMINE (BENADRYL) injection 25 mg (25 mg Intravenous Given 01/20/18 1954)     Initial Impression / Assessment and Plan / ED Course  Triage vital signs and the nursing notes have been reviewed.  Pertinent labs & imaging results that were available during care of the patient were reviewed and considered in medical decision making (see chart for details).  Patient presents for headache that has a pattern consistent with migraine. She presents well appearing and is not in acute distress. Patient denies recent falls or head traumas and no changes in mentation or cognition.  She has no focal neuro deficits which is reassuring given pt's history of stroke. No indication today for head imaging. Will order IV migraine cocktail for acute relief and re-evaluate patient.  Clinical Course as of Jan 20 2126  Wed Jan 20, 2018  1941 Migraine cocktail ordered. IV normal saline, Toradol, Reglan and Benadryl.   [GM]  2108 Patient re-evaluated and she reports resolution of headache.    [GM]    Clinical Course User Index [GM] Mortis, Jonelle Sports, PA-C    Final Clinical Impressions(s) / ED Diagnoses  1. Headache. History consistent with migraine. Relief achieved in the ED with migraine cocktail. Education provided on OTC and supportive treatments for future headaches. Advised to follow-up with PCP if headaches become more frequent or severe. Rx for ibuprofen 800mg  TID PRN given.  Dispo: Home. After thorough clinical evaluation, this patient is determined to be medically stable and can be safely discharged with the previously mentioned treatment and/or outpatient follow-up/referral(s). At this time, there are no other apparent medical conditions that require further screening, evaluation or treatment.   Final diagnoses:  Acute nonintractable headache, unspecified headache type    ED  Discharge Orders        Ordered    ibuprofen (ADVIL,MOTRIN) 800 MG tablet  3 times daily     01/20/18 2127        Junita Push 01/20/18 2127    Little, Wenda Overland, MD 01/20/18 [phone]

## 2018-01-20 NOTE — ED Notes (Signed)
Pt verbalized that she was told she was going to be discharged and needs her paperwork. This nurse ensured that I would get her paperwork as soon as possible but will start getting her ready.

## 2018-07-15 ENCOUNTER — Emergency Department (HOSPITAL_COMMUNITY)
Admission: EM | Admit: 2018-07-15 | Discharge: 2018-07-16 | Disposition: A | Discharge status: Home / Self Care | Payer: PRIVATE HEALTH INSURANCE | Attending: Emergency Medicine | Admitting: Emergency Medicine

## 2018-07-15 ENCOUNTER — Encounter (HOSPITAL_COMMUNITY): Payer: Self-pay

## 2018-07-15 DIAGNOSIS — H6123 Impacted cerumen, bilateral: Secondary | ICD-10-CM | POA: Insufficient documentation

## 2018-07-15 DIAGNOSIS — H918X2 Other specified hearing loss, left ear: Secondary | ICD-10-CM | POA: Diagnosis present

## 2018-07-15 DIAGNOSIS — F17210 Nicotine dependence, cigarettes, uncomplicated: Secondary | ICD-10-CM | POA: Diagnosis not present

## 2018-07-15 NOTE — ED Triage Notes (Signed)
Pt c/o inability to hear out of left ear x1-2 days.

## 2018-07-15 NOTE — ED Notes (Signed)
Pt denies any pain to left ear, st's she just can't hear out of it

## 2018-07-16 NOTE — ED Provider Notes (Signed)
[name] Provider Note   CSN: [id number] Arrival date & time: 07/15/18  2202     History   Chief Complaint Chief Complaint  Patient presents with  . hearing problem left ear    HPI Penny Berger is a 43 y.o. adult.  Patient to ED with decreased hearing in the left without pain or fever. She has had similar symptoms in the past with ear wax build up. No fever, congestion, sore throat. No drainage from the ear or bleeding.   The history is provided by the patient. No language interpreter was used.    Past Medical History:  Diagnosis Date  . Seizures ([place])   . Stroke ([place])   . Syncope     There are no active problems to display for this patient.   Past Surgical History:  Procedure Laterality Date  . APPENDECTOMY    . CESAREAN SECTION       OB History   No obstetric history on file.      Home Medications    Prior to Admission medications   Medication Sig Start Date End Date Taking? Authorizing Provider  medroxyPROGESTERone (DEPO-PROVERA) 150 MG/ML injection Inject 150 mg into the muscle every 3 (three) months.    [provider]  promethazine (PHENERGAN) 25 MG tablet Take 1 tablet (25 mg total) by mouth every 6 (six) hours as needed for nausea or vomiting. Patient not taking: Reported on 01/20/2018 09/17/17   Okey Regal, PA-C    Family History No family history on file.  Social History Social History   Tobacco Use  . Smoking status: Current Every Day Smoker    Packs/day: 0.50  . Smokeless tobacco: Never Used  Substance Use Topics  . Alcohol use: Yes  . Drug use: No     Allergies   Patient has no known allergies.   Review of Systems Review of Systems  Constitutional: Negative for fatigue.  HENT: Positive for hearing loss. Negative for congestion and sore throat.   Gastrointestinal: Negative for nausea.     Physical Exam Updated Vital Signs BP 133/86 (BP Location: Right Arm)   Pulse  99   Temp 98.3 F (36.8 C) (Oral)   Resp 18   Ht 5\' 5"  (1.651 m)   Wt 76.7 kg   SpO2 99%   BMI 28.12 kg/m   Physical Exam Vitals signs and nursing note reviewed.  Constitutional:      General: He is not in acute distress.    Appearance: He is well-developed.  HENT:     Right Ear: There is impacted cerumen.     Left Ear: There is impacted cerumen.  Neck:     Musculoskeletal: Normal range of motion.  Pulmonary:     Effort: Pulmonary effort is normal.  Skin:    General: Skin is warm and dry.  Neurological:     Mental Status: He is alert and oriented to person, place, and time.      ED Treatments / Results  Labs (all labs ordered are listed, but only abnormal results are displayed) Labs Reviewed - No data to display  EKG None  Radiology No results found.  Procedures Procedures (including critical care time)  Medications Ordered in ED Medications - No data to display   Initial Impression / Assessment and Plan / ED Course  I have reviewed the triage vital signs and the nursing notes.  Pertinent labs & imaging results that were available during my care of  the patient were reviewed by me and considered in my medical decision making (see chart for details).     Ear lavage performed for c/o left hearing loss and finding of bilateral cerumen impaction. Copious wax removed with resolution of patient's symptoms.   Final Clinical Impressions(s) / ED Diagnoses   Final diagnoses:  None   1. Cerumen impaction  ED Discharge Orders    None       Dennie Bible 07/16/18 [place], April, MD 07/16/18 0116

## 2018-09-09 ENCOUNTER — Emergency Department (HOSPITAL_COMMUNITY)
Admission: EM | Admit: 2018-09-09 | Discharge: 2018-09-09 | Disposition: A | Discharge status: Home / Self Care | Payer: PRIVATE HEALTH INSURANCE | Attending: Emergency Medicine | Admitting: Emergency Medicine

## 2018-09-09 ENCOUNTER — Encounter (HOSPITAL_COMMUNITY): Payer: Self-pay | Admitting: Emergency Medicine

## 2018-09-09 ENCOUNTER — Emergency Department (HOSPITAL_COMMUNITY): Payer: PRIVATE HEALTH INSURANCE

## 2018-09-09 DIAGNOSIS — R1031 Right lower quadrant pain: Secondary | ICD-10-CM | POA: Diagnosis not present

## 2018-09-09 DIAGNOSIS — Z8673 Personal history of transient ischemic attack (TIA), and cerebral infarction without residual deficits: Secondary | ICD-10-CM | POA: Diagnosis not present

## 2018-09-09 DIAGNOSIS — Z79899 Other long term (current) drug therapy: Secondary | ICD-10-CM | POA: Insufficient documentation

## 2018-09-09 DIAGNOSIS — R109 Unspecified abdominal pain: Secondary | ICD-10-CM

## 2018-09-09 DIAGNOSIS — N23 Unspecified renal colic: Secondary | ICD-10-CM | POA: Diagnosis not present

## 2018-09-09 DIAGNOSIS — F17200 Nicotine dependence, unspecified, uncomplicated: Secondary | ICD-10-CM | POA: Diagnosis not present

## 2018-09-09 DIAGNOSIS — N200 Calculus of kidney: Secondary | ICD-10-CM

## 2018-09-09 LAB — CBC WITH DIFFERENTIAL/PLATELET
Abs Immature Granulocytes: 0.04 10*3/uL (ref 0.00–0.07)
Basophils Absolute: 0 10*3/uL (ref 0.0–0.1)
Basophils Relative: 1 %
Eosinophils Absolute: 0.1 10*3/uL (ref 0.0–0.5)
Eosinophils Relative: 1 %
HCT: 40.9 % (ref 36.0–46.0)
Hemoglobin: 13 g/dL (ref 12.0–15.0)
Immature Granulocytes: 1 %
Lymphocytes Relative: 26 %
Lymphs Abs: 1.6 10*3/uL (ref 0.7–4.0)
MCH: 29.3 pg (ref 26.0–34.0)
MCHC: 31.8 g/dL (ref 30.0–36.0)
MCV: 92.1 fL (ref 80.0–100.0)
Monocytes Absolute: 0.7 10*3/uL (ref 0.1–1.0)
Monocytes Relative: 11 %
Neutro Abs: 3.8 10*3/uL (ref 1.7–7.7)
Neutrophils Relative %: 60 %
Platelets: 376 10*3/uL (ref 150–400)
RBC: 4.44 MIL/uL (ref 3.87–5.11)
RDW: 14.6 % (ref 11.5–15.5)
WBC: 6.2 10*3/uL (ref 4.0–10.5)
nRBC: 0 % (ref 0.0–0.2)

## 2018-09-09 LAB — LIPASE, BLOOD: Lipase: 31 U/L (ref 11–51)

## 2018-09-09 LAB — COMPREHENSIVE METABOLIC PANEL
ALT: 12 U/L (ref 0–44)
AST: 14 U/L — ABNORMAL LOW (ref 15–41)
Albumin: 3.6 g/dL (ref 3.5–5.0)
Alkaline Phosphatase: 44 U/L (ref 38–126)
Anion gap: 8 (ref 5–15)
BUN: 11 mg/dL (ref 6–20)
CO2: 19 mmol/L — ABNORMAL LOW (ref 22–32)
Calcium: 8.7 mg/dL — ABNORMAL LOW (ref 8.9–10.3)
Chloride: 110 mmol/L (ref 98–111)
Creatinine, Ser: 0.77 mg/dL (ref 0.44–1.00)
GFR calc Af Amer: >60 mL/min (ref >60)
GFR calc non Af Amer: >60 mL/min (ref >60)
Glucose, Bld: 108 mg/dL — ABNORMAL HIGH (ref 70–99)
Potassium: 3.6 mmol/L (ref 3.5–5.1)
Sodium: 137 mmol/L (ref 135–145)
Total Bilirubin: 0.7 mg/dL (ref 0.3–1.2)
Total Protein: 6.7 g/dL (ref 6.5–8.1)

## 2018-09-09 LAB — URINALYSIS, ROUTINE W REFLEX MICROSCOPIC
Bilirubin Urine: NEGATIVE
Glucose, UA: NEGATIVE mg/dL
Ketones, ur: NEGATIVE mg/dL
Leukocytes,Ua: NEGATIVE
Nitrite: NEGATIVE
Protein, ur: 30 mg/dL — AB
RBC / HPF: >50 RBC/hpf — ABNORMAL HIGH (ref 0–5)
Specific Gravity, Urine: 1.024 (ref 1.005–1.030)
pH: 6 (ref 5.0–8.0)

## 2018-09-09 LAB — I-STAT BETA HCG BLOOD, ED (MC, WL, AP ONLY): I-stat hCG, quantitative: <5 m[IU]/mL (ref <5)

## 2018-09-09 MED ORDER — HYDROMORPHONE HCL 1 MG/ML IJ SOLN
1.0000 mg | Freq: Once | INTRAMUSCULAR | Status: AC
  Administered 2018-09-09: 1 mg via INTRAVENOUS
  Filled 2018-09-09: qty 1

## 2018-09-09 MED ORDER — ONDANSETRON 8 MG PO TBDP: 8.0000 mg | ORAL_TABLET | Freq: Three times a day (TID) | ORAL | 0 refills | Status: DC | PRN

## 2018-09-09 MED ORDER — OXYCODONE-ACETAMINOPHEN 5-325 MG PO TABS
1.0000 | ORAL_TABLET | Freq: Once | ORAL | Status: AC
  Administered 2018-09-09: 1 via ORAL
  Filled 2018-09-09: qty 1

## 2018-09-09 MED ORDER — HYDROCODONE-ACETAMINOPHEN 5-325 MG PO TABS: 1.0000 | ORAL_TABLET | Freq: Four times a day (QID) | ORAL | 0 refills | Status: DC | PRN

## 2018-09-09 MED ORDER — KETOROLAC TROMETHAMINE 30 MG/ML IJ SOLN
15.0000 mg | Freq: Once | INTRAMUSCULAR | Status: AC
  Administered 2018-09-09: 15 mg via INTRAVENOUS
  Filled 2018-09-09: qty 1

## 2018-09-09 MED ORDER — ONDANSETRON HCL 4 MG/2ML IJ SOLN
4.0000 mg | Freq: Once | INTRAMUSCULAR | Status: AC
  Administered 2018-09-09: 4 mg via INTRAVENOUS
  Filled 2018-09-09: qty 2

## 2018-09-09 NOTE — ED Notes (Signed)
Pt aware a urine sample is needed and will provide one when able.  

## 2018-09-09 NOTE — ED Notes (Signed)
Discharge instructions and prescriptions discussed with Pt. Pt verbalized understanding. Pt stable and ambulatory.   

## 2018-09-09 NOTE — ED Triage Notes (Signed)
Pt reports sharp RLQ pain since this morning, reports an episode of emesis.

## 2018-09-09 NOTE — ED Provider Notes (Signed)
[facility] EMERGENCY DEPARTMENT Provider Note   CSN: [id number] Arrival date & time: 09/09/18  1321    History   Chief Complaint Chief Complaint  Patient presents with  . Abdominal Pain    HPI Penny Berger is a 43 y.o. adult.     HPI   43 year old female presents today with right-sided flank pain.  She notes she was in her usual state of health today.  She developed an acute onset cramping pressure pain in her right flank and abdomen.  She has a severe caused her to have several episodes of vomiting.  She has the symptoms have improved but still endorses a dull ache in the right flank and abdomen.  She denies any lower abdominal pain, vaginal bleeding or discharge.  She denies any urinary symptoms.  She reports a history of appendectomy, is currently on depo for birth control.  She denies any history of same.  No medications prior to arrival.  Past Medical History:  Diagnosis Date  . Seizures ([place])   . Stroke ([place])   . Syncope     There are no active problems to display for this patient.   Past Surgical History:  Procedure Laterality Date  . APPENDECTOMY    . CESAREAN SECTION       OB History   No obstetric history on file.      Home Medications    Prior to Admission medications   Medication Sig Start Date End Date Taking? Authorizing Provider  medroxyPROGESTERone (DEPO-PROVERA) 150 MG/ML injection Inject 150 mg into the muscle every 3 (three) months.    [provider]  promethazine (PHENERGAN) 25 MG tablet Take 1 tablet (25 mg total) by mouth every 6 (six) hours as needed for nausea or vomiting. Patient not taking: Reported on 01/20/2018 09/17/17   Okey Regal, PA-C    Family History History reviewed. No pertinent family history.  Social History Social History   Tobacco Use  . Smoking status: Current Every Day Smoker    Packs/day: 0.50  . Smokeless tobacco: Never Used  Substance Use Topics  . Alcohol use: Yes  . Drug  use: No     Allergies   Patient has no known allergies.   Review of Systems Review of Systems  All other systems reviewed and are negative.    Physical Exam Updated Vital Signs BP 113/71 (BP Location: Right Arm)   Pulse 85   Temp 98.3 F (36.8 C) (Oral)   Resp 20   SpO2 100%   Physical Exam Vitals signs and nursing note reviewed.  Constitutional:      Appearance: He is well-developed.  HENT:     Head: Normocephalic and atraumatic.  Eyes:     General: No scleral icterus.       Right eye: No discharge.        Left eye: No discharge.     Conjunctiva/sclera: Conjunctivae normal.     Pupils: Pupils are equal, round, and reactive to light.  Neck:     Musculoskeletal: Normal range of motion.     Vascular: No JVD.     Trachea: No tracheal deviation.  Pulmonary:     Effort: Pulmonary effort is normal.     Breath sounds: No stridor.  Abdominal:     Comments: Minimal tenderness palpation of right mid abdomen and flank-no significant recollected tenderness palpation, remainder of abdomen lower abdomen and pelvis nontender  Neurological:     Mental Status: He is alert and  oriented to person, place, and time.     Coordination: Coordination normal.  Psychiatric:        Behavior: Behavior normal.        Thought Content: Thought content normal.        Judgment: Judgment normal.      ED Treatments / Results  Labs (all labs ordered are listed, but only abnormal results are displayed) Labs Reviewed  COMPREHENSIVE METABOLIC PANEL - Abnormal; Notable for the following components:      Result Value   CO2 19 (*)    Glucose, Bld 108 (*)    Calcium 8.7 (*)    AST 14 (*)    All other components within normal limits  CBC WITH DIFFERENTIAL/PLATELET  LIPASE, BLOOD  URINALYSIS, ROUTINE W REFLEX MICROSCOPIC  I-STAT BETA HCG BLOOD, ED ([facility], [facility], [facility] ONLY)    EKG None  Radiology No results found.  Procedures Procedures (including critical care time)  Medications Ordered in  ED Medications  oxyCODONE-acetaminophen (PERCOCET/ROXICET) 5-325 MG per tablet 1 tablet (1 tablet Oral Given 09/09/18 1357)     Initial Impression / Assessment and Plan / ED Course  I have reviewed the triage vital signs and the nursing notes.  Pertinent labs & imaging results that were available during my care of the patient were reviewed by me and considered in my medical decision making (see chart for details).        Labs:   Imaging:  Consults:  Therapeutics:  Discharge Meds:   Assessment/Plan: 54 YOF presents today with flank pain. Question kidney stone, lower suspicion for gallbladder pathology. No lower abd or pelvc pain. Pt is not in acute distress presently. Will obtain labs and imaging. Pt care transferred at time of shift change.     Final Clinical Impressions(s) / ED Diagnoses   Final diagnoses:  Flank pain    ED Discharge Orders    None       Francee Gentile 09/09/18 1455    Lajean Saver, MD 09/09/18 [phone]

## 2018-09-09 NOTE — Discharge Instructions (Signed)
It was our pleasure to provide your ER care today - we hope that you feel better.  Your CT scan shows a 3 mm right sided kidney stone.   Rest. Drink plenty of fluids. Strain urine.   Take motrin or aleve as need for pain. You may also take hydrocodone as need for pain. No driving for the next 6 hours or when taking hydrocodone. Also, do not take tylenol or acetaminophen containing medication when taking hydrocodone. Take zofran as need for nausea.  Follow up with urologist in 1 week if symptoms fail to improve/resolve.  Return to ER if worse, new symptoms, high fevers, intractable pain, persistent vomiting, other concern.

## 2018-12-20 ENCOUNTER — Other Ambulatory Visit: Payer: Self-pay

## 2018-12-20 ENCOUNTER — Encounter (HOSPITAL_COMMUNITY): Payer: Self-pay | Admitting: *Deleted

## 2018-12-20 ENCOUNTER — Emergency Department (HOSPITAL_COMMUNITY)
Admission: EM | Admit: 2018-12-20 | Discharge: 2018-12-20 | Disposition: A | Discharge status: Home / Self Care | Payer: PRIVATE HEALTH INSURANCE | Attending: Emergency Medicine | Admitting: Emergency Medicine

## 2018-12-20 ENCOUNTER — Emergency Department (HOSPITAL_COMMUNITY): Payer: PRIVATE HEALTH INSURANCE

## 2018-12-20 DIAGNOSIS — N3000 Acute cystitis without hematuria: Secondary | ICD-10-CM

## 2018-12-20 DIAGNOSIS — Z79899 Other long term (current) drug therapy: Secondary | ICD-10-CM | POA: Insufficient documentation

## 2018-12-20 DIAGNOSIS — U071 COVID-19: Secondary | ICD-10-CM

## 2018-12-20 DIAGNOSIS — Z8673 Personal history of transient ischemic attack (TIA), and cerebral infarction without residual deficits: Secondary | ICD-10-CM | POA: Diagnosis not present

## 2018-12-20 DIAGNOSIS — F17200 Nicotine dependence, unspecified, uncomplicated: Secondary | ICD-10-CM | POA: Insufficient documentation

## 2018-12-20 DIAGNOSIS — M791 Myalgia, unspecified site: Secondary | ICD-10-CM | POA: Diagnosis present

## 2018-12-20 LAB — URINALYSIS, ROUTINE W REFLEX MICROSCOPIC
Bilirubin Urine: NEGATIVE
Glucose, UA: NEGATIVE mg/dL
Ketones, ur: 20 mg/dL — AB
Leukocytes,Ua: NEGATIVE
Nitrite: POSITIVE — AB
Protein, ur: NEGATIVE mg/dL
Specific Gravity, Urine: 1.018 (ref 1.005–1.030)
pH: 6 (ref 5.0–8.0)

## 2018-12-20 LAB — LACTIC ACID, PLASMA: Lactic Acid, Venous: 1.3 mmol/L (ref 0.5–1.9)

## 2018-12-20 LAB — COMPREHENSIVE METABOLIC PANEL
ALT: 27 U/L (ref 0–44)
AST: 26 U/L (ref 15–41)
Albumin: 3.8 g/dL (ref 3.5–5.0)
Alkaline Phosphatase: 50 U/L (ref 38–126)
Anion gap: 10 (ref 5–15)
BUN: 5 mg/dL — ABNORMAL LOW (ref 6–20)
CO2: 21 mmol/L — ABNORMAL LOW (ref 22–32)
Calcium: 8.8 mg/dL — ABNORMAL LOW (ref 8.9–10.3)
Chloride: 107 mmol/L (ref 98–111)
Creatinine, Ser: 0.75 mg/dL (ref 0.44–1.00)
GFR calc Af Amer: >60 mL/min (ref >60)
GFR calc non Af Amer: >60 mL/min (ref >60)
Glucose, Bld: 89 mg/dL (ref 70–99)
Potassium: 3.8 mmol/L (ref 3.5–5.1)
Sodium: 138 mmol/L (ref 135–145)
Total Bilirubin: 0.7 mg/dL (ref 0.3–1.2)
Total Protein: 7.3 g/dL (ref 6.5–8.1)

## 2018-12-20 LAB — LIPASE, BLOOD: Lipase: 29 U/L (ref 11–51)

## 2018-12-20 LAB — CBC WITH DIFFERENTIAL/PLATELET
Abs Immature Granulocytes: 0.01 10*3/uL (ref 0.00–0.07)
Basophils Absolute: 0 10*3/uL (ref 0.0–0.1)
Basophils Relative: 1 %
Eosinophils Absolute: 0 10*3/uL (ref 0.0–0.5)
Eosinophils Relative: 0 %
HCT: 46.2 % — ABNORMAL HIGH (ref 36.0–46.0)
Hemoglobin: 15.2 g/dL — ABNORMAL HIGH (ref 12.0–15.0)
Immature Granulocytes: 0 %
Lymphocytes Relative: 31 %
Lymphs Abs: 1 10*3/uL (ref 0.7–4.0)
MCH: 30.4 pg (ref 26.0–34.0)
MCHC: 32.9 g/dL (ref 30.0–36.0)
MCV: 92.4 fL (ref 80.0–100.0)
Monocytes Absolute: 0.5 10*3/uL (ref 0.1–1.0)
Monocytes Relative: 16 %
Neutro Abs: 1.6 10*3/uL — ABNORMAL LOW (ref 1.7–7.7)
Neutrophils Relative %: 52 %
Platelets: 293 10*3/uL (ref 150–400)
RBC: 5 MIL/uL (ref 3.87–5.11)
RDW: 14.5 % (ref 11.5–15.5)
WBC: 3.2 10*3/uL — ABNORMAL LOW (ref 4.0–10.5)
nRBC: 0 % (ref 0.0–0.2)

## 2018-12-20 LAB — SARS CORONAVIRUS 2 BY RT PCR (HOSPITAL ORDER, PERFORMED IN ~~LOC~~ HOSPITAL LAB): SARS Coronavirus 2: POSITIVE — AB

## 2018-12-20 MED ORDER — ACETAMINOPHEN 325 MG PO TABS
650.0000 mg | ORAL_TABLET | Freq: Once | ORAL | Status: AC
  Administered 2018-12-20: 650 mg via ORAL
  Filled 2018-12-20: qty 2

## 2018-12-20 MED ORDER — SODIUM CHLORIDE 0.9 % IV BOLUS (SEPSIS)
1000.0000 mL | Freq: Once | INTRAVENOUS | Status: AC
  Administered 2018-12-20: 1000 mL via INTRAVENOUS

## 2018-12-20 MED ORDER — FOSFOMYCIN TROMETHAMINE 3 G PO PACK
3.0000 g | PACK | Freq: Once | ORAL | Status: AC
  Administered 2018-12-20: 3 g via ORAL
  Filled 2018-12-20: qty 3

## 2018-12-20 MED ORDER — SODIUM CHLORIDE 0.9 % IV BOLUS (SEPSIS)
500.0000 mL | Freq: Once | INTRAVENOUS | Status: AC
  Administered 2018-12-20: 500 mL via INTRAVENOUS

## 2018-12-20 NOTE — ED Notes (Signed)
Urine culture sent with urinalysis.  

## 2018-12-20 NOTE — ED Notes (Signed)
Patient verbalizes understanding of discharge instructions. Opportunity for questioning and answers were provided. Armband removed by staff, pt discharged from ED.  

## 2018-12-20 NOTE — Discharge Instructions (Addendum)
Your coronavirus test is positive.  Below are instructions on how to isolate safely at home with your family members.  You should stay in your home for the next 2 weeks from the onset of your symptoms which seems to be 2 days ago.  You may return to work on the 20th.  Was also be 72 hours fever free without fever reducing medications. These take this opportunity to quit smoking and do everything with in your power to limit any salt on your lungs while you are actively fighting this virus which has the potential to cause severe respiratory symptoms. You also had a urinary tract infection and were given a dose of fosfomycin antibiotics which should treat your urinary tract infection fully. Please return to the emergency department if you are having any worsening of your breathing.  It is helpful to buy a home oxygen sensor which you can purchase at a drugstore or online to make sure that your oxygen saturations are staying above 90%. Contact a doctor if: You do not get better after 1-2 days. Your symptoms go away and then come back. Get help right away if: You have very bad back pain. You have very bad pain in your lower belly. You have a fever. You are sick to your stomach (nauseous). You are throwing up.     Person Under Monitoring Name: Penny Berger  Location: [address] [place] [address]   Infection Prevention Recommendations for Individuals Confirmed to have, or Being Evaluated for, 2019 Novel Coronavirus (COVID-19) Infection Who Receive Care at Home  Individuals who are confirmed to have, or are being evaluated for, COVID-19 should follow the prevention steps below until a healthcare provider or local or state health department says they can return to normal activities.  Stay home except to get medical care You should restrict activities outside your home, except for getting medical care. Do not go to work, school, or public areas, and do not use public  transportation or taxis.  Call ahead before visiting your doctor Before your medical appointment, call the healthcare provider and tell them that you have, or are being evaluated for, COVID-19 infection. This will help the healthcare providers office take steps to keep other people from getting infected. Ask your healthcare provider to call the local or state health department.  Monitor your symptoms Seek prompt medical attention if your illness is worsening (e.g., difficulty breathing). Before going to your medical appointment, call the healthcare provider and tell them that you have, or are being evaluated for, COVID-19 infection. Ask your healthcare provider to call the local or state health department.  Wear a facemask You should wear a facemask that covers your nose and mouth when you are in the same room with other people and when you visit a healthcare provider. People who live with or visit you should also wear a facemask while they are in the same room with you.  Separate yourself from other people in your home As much as possible, you should stay in a different room from other people in your home. Also, you should use a separate bathroom, if available.  Avoid sharing household items You should not share dishes, drinking glasses, cups, eating utensils, towels, bedding, or other items with other people in your home. After using these items, you should wash them thoroughly with soap and water.  Cover your coughs and sneezes Cover your mouth and nose with a tissue when you cough or sneeze, or you can  cough or sneeze into your sleeve. Throw used tissues in a lined trash can, and immediately wash your hands with soap and water for at least 20 seconds or use an alcohol-based hand rub.  Wash your [facility] your hands often and thoroughly with soap and water for at least 20 seconds. You can use an alcohol-based hand sanitizer if soap and water are not available and if your hands  are not visibly dirty. Avoid touching your eyes, nose, and mouth with unwashed hands.   Prevention Steps for Caregivers and Household Members of Individuals Confirmed to have, or Being Evaluated for, COVID-19 Infection Being Cared for in the Home  If you live with, or provide care at home for, a person confirmed to have, or being evaluated for, COVID-19 infection please follow these guidelines to prevent infection:  Follow healthcare providers instructions Make sure that you understand and can help the patient follow any healthcare provider instructions for all care.  Provide for the patients basic needs You should help the patient with basic needs in the home and provide support for getting groceries, prescriptions, and other personal needs.  Monitor the patients symptoms If they are getting sicker, call his or her medical provider and tell them that the patient has, or is being evaluated for, COVID-19 infection. This will help the healthcare providers office take steps to keep other people from getting infected. Ask the healthcare provider to call the local or state health department.  Limit the number of people who have contact with the patient If possible, have only one caregiver for the patient. Other household members should stay in another home or place of residence. If this is not possible, they should stay in another room, or be separated from the patient as much as possible. Use a separate bathroom, if available. Restrict visitors who do not have an essential need to be in the home.  Keep older adults, very young children, and other sick people away from the patient Keep older adults, very young children, and those who have compromised immune systems or chronic health conditions away from the patient. This includes people with chronic heart, lung, or kidney conditions, diabetes, and cancer.  Ensure good ventilation Make sure that shared spaces in the home have good air  flow, such as from an air conditioner or an opened window, weather permitting.  Wash your hands often Wash your hands often and thoroughly with soap and water for at least 20 seconds. You can use an alcohol based hand sanitizer if soap and water are not available and if your hands are not visibly dirty. Avoid touching your eyes, nose, and mouth with unwashed hands. Use disposable paper towels to dry your hands. If not available, use dedicated cloth towels and replace them when they become wet.  Wear a facemask and gloves Wear a disposable facemask at all times in the room and gloves when you touch or have contact with the patients blood, body fluids, and/or secretions or excretions, such as sweat, saliva, sputum, nasal mucus, vomit, urine, or feces.  Ensure the mask fits over your nose and mouth tightly, and do not touch it during use. Throw out disposable facemasks and gloves after using them. Do not reuse. Wash your hands immediately after removing your facemask and gloves. If your personal clothing becomes contaminated, carefully remove clothing and launder. Wash your hands after handling contaminated clothing. Place all used disposable facemasks, gloves, and other waste in a lined container before disposing them with other household  waste. Remove gloves and wash your hands immediately after handling these items.  Do not share dishes, glasses, or other household items with the patient Avoid sharing household items. You should not share dishes, drinking glasses, cups, eating utensils, towels, bedding, or other items with a patient who is confirmed to have, or being evaluated for, COVID-19 infection. After the person uses these items, you should wash them thoroughly with soap and water.  Wash laundry thoroughly Immediately remove and wash clothes or bedding that have blood, body fluids, and/or secretions or excretions, such as sweat, saliva, sputum, nasal mucus, vomit, urine, or feces, on  them. Wear gloves when handling laundry from the patient. Read and follow directions on labels of laundry or clothing items and detergent. In general, wash and dry with the warmest temperatures recommended on the label.  Clean all areas the individual has used often Clean all touchable surfaces, such as counters, tabletops, doorknobs, bathroom fixtures, toilets, phones, keyboards, tablets, and bedside tables, every day. Also, clean any surfaces that may have blood, body fluids, and/or secretions or excretions on them. Wear gloves when cleaning surfaces the patient has come in contact with. Use a diluted bleach solution (e.g., dilute bleach with 1 part bleach and 10 parts water) or a household disinfectant with a label that says EPA-registered for coronaviruses. To make a bleach solution at home, add 1 tablespoon of bleach to 1 quart (4 cups) of water. For a larger supply, add  cup of bleach to 1 gallon (16 cups) of water. Read labels of cleaning products and follow recommendations provided on product labels. Labels contain instructions for safe and effective use of the cleaning product including precautions you should take when applying the product, such as wearing gloves or eye protection and making sure you have good ventilation during use of the product. Remove gloves and wash hands immediately after cleaning.  Monitor yourself for signs and symptoms of illness Caregivers and household members are considered close contacts, should monitor their health, and will be asked to limit movement outside of the home to the extent possible. Follow the monitoring steps for close contacts listed on the symptom monitoring form.   ? If you have additional questions, contact your local health department or call the epidemiologist on call at [phone] (available 24/7). ? This guidance is subject to change. For the most up-to-date guidance from [facility], please refer to their  website: [email or web address]

## 2018-12-20 NOTE — ED Provider Notes (Signed)
[facility] EMERGENCY DEPARTMENT Provider Note   CSN: [id number] Arrival date & time: 12/20/18  1444    History   Chief Complaint Chief Complaint  Patient presents with  . Abdominal Pain    HPI Penny Berger is a 43 y.o. adult female who presents to the emergency department with a chief complaint of viral-like symptoms.  The patient states that 5 days ago she had body aches and a headache.  She felt okay for about 2 days but went to [employer] and ordered breakfast and as she was eating it noticed that she could not taste anything.  The patient is also a daily smoker and states that she was smoking a cigarette and could not smell the cigarette at all.  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  She states that they do have coronavirus outbreak.  She is tearful and quite worried.  She denies nausea vomiting or diarrhea.  She denies shortness of breath but does have a slight cough which she state is chronic from her smoking.  She denies a history of asthma or wheezing.  She does not feel short of breath.  She states that she has some achiness in her abdomen which she associates with how she feels whenever she has a viral symptoms.  She feels fatigued.  Today she noted that she was febrile and came in for evaluation.     HPI  Past Medical History:  Diagnosis Date  . Seizures ([place])   . Stroke ([place])   . Syncope     There are no active problems to display for this patient.   Past Surgical History:  Procedure Laterality Date  . APPENDECTOMY    . CESAREAN SECTION       OB History   No obstetric history on file.      Home Medications    Prior to Admission medications   Medication Sig Start Date End Date Taking? Authorizing Provider  HYDROcodone-acetaminophen (NORCO/VICODIN) 5-325 MG tablet Take 1-2 tablets by mouth every 6 (six) hours as needed for moderate pain. 09/09/18   Lajean Saver, MD  medroxyPROGESTERone (DEPO-PROVERA) 150 MG/ML  injection Inject 150 mg into the muscle every 3 (three) months.    [provider]  ondansetron (ZOFRAN ODT) 8 MG disintegrating tablet Take 1 tablet (8 mg total) by mouth every 8 (eight) hours as needed for nausea or vomiting. 09/09/18   Lajean Saver, MD  promethazine (PHENERGAN) 25 MG tablet Take 1 tablet (25 mg total) by mouth every 6 (six) hours as needed for nausea or vomiting. Patient not taking: Reported on 01/20/2018 09/17/17   Okey Regal, PA-C    Family History No family history on file.  Social History Social History   Tobacco Use  . Smoking status: Current Every Day Smoker    Packs/day: 0.50  . Smokeless tobacco: Never Used  Substance Use Topics  . Alcohol use: Yes  . Drug use: No     Allergies   Patient has no known allergies.   Review of Systems Review of Systems  Ten systems reviewed and are negative for acute change, except as noted in the HPI.   Physical Exam Updated Vital Signs BP 125/86   Pulse 84   Temp (!) 100.4 F (38 C) (Oral)   Resp 17   SpO2 98%   Physical Exam Vitals signs and nursing note reviewed.  Constitutional:      General: He is not in acute distress.  Appearance: He is well-developed. He is not diaphoretic.  HENT:     Head: Normocephalic and atraumatic.  Eyes:     General: No scleral icterus.    Conjunctiva/sclera: Conjunctivae normal.  Neck:     Musculoskeletal: Normal range of motion.  Cardiovascular:     Rate and Rhythm: Normal rate and regular rhythm.     Heart sounds: Normal heart sounds. No murmur. No friction rub. No gallop.   Pulmonary:     Effort: Pulmonary effort is normal. No respiratory distress.     Breath sounds: Normal breath sounds.  Abdominal:     General: Bowel sounds are normal. There is no distension.     Palpations: Abdomen is soft. There is no mass.     Tenderness: There is no abdominal tenderness. There is no guarding.  Skin:    General: Skin is warm and dry.  Neurological:     Mental  Status: He is alert and oriented to person, place, and time.  Psychiatric:        Behavior: Behavior normal.      ED Treatments / Results  Labs (all labs ordered are listed, but only abnormal results are displayed) Labs Reviewed  SARS CORONAVIRUS 2 (HOSPITAL ORDER, [facility]) - Abnormal; Notable for the following components:      Result Value   SARS Coronavirus 2 POSITIVE (*)    All other components within normal limits  COMPREHENSIVE METABOLIC PANEL - Abnormal; Notable for the following components:   CO2 21 (*)    BUN 5 (*)    Calcium 8.8 (*)    All other components within normal limits  CBC WITH DIFFERENTIAL/PLATELET - Abnormal; Notable for the following components:   WBC 3.2 (*)    Hemoglobin 15.2 (*)    HCT 46.2 (*)    Neutro Abs 1.6 (*)    All other components within normal limits  URINALYSIS, ROUTINE W REFLEX MICROSCOPIC - Abnormal; Notable for the following components:   APPearance HAZY (*)    Hgb urine dipstick SMALL (*)    Ketones, ur 20 (*)    Nitrite POSITIVE (*)    Bacteria, UA MANY (*)    All other components within normal limits  CULTURE, BLOOD (ROUTINE X 2)  CULTURE, BLOOD (ROUTINE X 2)  LACTIC ACID, PLASMA  LIPASE, BLOOD  LACTIC ACID, PLASMA    EKG EKG Interpretation  Date/Time:  Monday December 20 2018 15:07:03 EDT Ventricular Rate:  118 PR Interval:    QRS Duration: 82 QT Interval:  279 QTC Calculation: 391 R Axis:   73 Text Interpretation:  Sinus tachycardia Borderline T abnormalities, lateral leads Since last tracing rate faster Confirmed by Dorie Rank [phone]) on 12/20/2018 4:30:58 PM   Radiology Dg Chest Port 1 View  Result Date: 12/20/2018 CLINICAL DATA:  Dry cough and tachycardia EXAM: PORTABLE CHEST 1 VIEW COMPARISON:  02/22/2017 FINDINGS: The heart size and mediastinal contours are within normal limits. Both lungs are clear. The visualized skeletal structures are unremarkable. IMPRESSION: No active disease.  Electronically Signed   By: Inez Catalina M.D.   On: 12/20/2018 16:13    Procedures Procedures (including critical care time)  Medications Ordered in ED Medications  sodium chloride 0.9 % bolus 1,000 mL (0 mLs Intravenous Stopped 12/20/18 1832)    And  sodium chloride 0.9 % bolus 1,000 mL (0 mLs Intravenous Stopped 12/20/18 1832)    And  sodium chloride 0.9 % bolus 500 mL (0 mLs Intravenous Stopped  12/20/18 1944)  acetaminophen (TYLENOL) tablet 650 mg (650 mg Oral Given 12/20/18 1549)  fosfomycin (MONUROL) packet 3 g (3 g Oral Given 12/20/18 2017)     Initial Impression / Assessment and Plan / ED Course  I have reviewed the triage vital signs and the nursing notes.  Pertinent labs & imaging results that were available during my care of the patient were reviewed by me and considered in my medical decision making (see chart for details).  Clinical Course as of Dec 20 2027  Mon Dec 20, 2018  1742 WBC(!): 3.2 [AH]  1742 Hemoglobin(!): 15.2 [AH]    Clinical Course User Index [AH] Margarita Mail, PA-C       .LG:XQJJH sxs VS: BP 125/86   Pulse 84   Temp (!) 100.4 F (38 C) (Oral)   Resp 17   SpO2 98%   ER:DEYCXKG is gathered by Patient  and EMR. Labs: I reviewed the labs which show UA + for infection. COVID-19 POSITIVE Imaging: I personally reviewed the images (cxr 1V) which show(s) no acute consolidations or infiltrations EKG:   EKG Interpretation  Date/Time:  Monday December 20 2018 15:07:03 EDT Ventricular Rate:  118 PR Interval:    QRS Duration: 82 QT Interval:  279 QTC Calculation: 391 R Axis:   73 Text Interpretation:  Sinus tachycardia Borderline T abnormalities, lateral leads Since last tracing rate faster Confirmed by Dorie Rank [phone]) on 12/20/2018 4:30:58 PM       MDM:DDX includes sepsis, Gastroneteritis, other viral illness.  The patient has positive coronavirus, positive urinary tract infection, negative lipase, CBC shows leukopeniaCMP without significant  abnormality.  Patient will be discharged today with home isolation precautions.  Given fosfomycin here for her urinary tract infection.  Discussed return precautions. Patient disposition:discharge with home isolation Patient condition: .good. The patient appears reasonably screened and/or stabilized for discharge and I doubt any other medical condition or other [facility] requiring further screening, evaluation, or treatment in the ED at this time prior to discharge. I have discussed lab and/or imaging findings with the patient and answered all questions/concerns to the best of my ability. I have discussed return precautions and OP follow up.      Final Clinical Impressions(s) / ED Diagnoses   Final diagnoses:  [id number] virus detected  Acute cystitis without hematuria    ED Discharge Orders    None       Margarita Mail, PA-C 12/20/18 2029    Dorie Rank, MD 12/23/18 1415

## 2018-12-20 NOTE — ED Triage Notes (Signed)
C/o occ. Sob states she can't taste or smell  Onset Sat. States she works at [employer] and several residents have tested positive for covid.

## 2018-12-25 LAB — CULTURE, BLOOD (ROUTINE X 2)
Culture: NO GROWTH
Culture: NO GROWTH
Special Requests: ADEQUATE

## 2019-08-04 ENCOUNTER — Emergency Department (HOSPITAL_COMMUNITY)
Admission: EM | Admit: 2019-08-04 | Discharge: 2019-08-04 | Disposition: A | Discharge status: Home / Self Care | Payer: PRIVATE HEALTH INSURANCE | Attending: Emergency Medicine | Admitting: Emergency Medicine

## 2019-08-04 ENCOUNTER — Encounter (HOSPITAL_COMMUNITY): Payer: Self-pay | Admitting: Emergency Medicine

## 2019-08-04 ENCOUNTER — Other Ambulatory Visit: Payer: Self-pay

## 2019-08-04 DIAGNOSIS — F17210 Nicotine dependence, cigarettes, uncomplicated: Secondary | ICD-10-CM | POA: Insufficient documentation

## 2019-08-04 DIAGNOSIS — L02411 Cutaneous abscess of right axilla: Secondary | ICD-10-CM | POA: Insufficient documentation

## 2019-08-04 MED ORDER — DOXYCYCLINE HYCLATE 100 MG PO CAPS: 100.0000 mg | ORAL_CAPSULE | Freq: Two times a day (BID) | ORAL | 0 refills | Status: DC

## 2019-08-04 NOTE — ED Provider Notes (Signed)
[facility] EMERGENCY DEPARTMENT Provider Note   CSN: [id number] Arrival date & time: 08/04/19  1700     History Chief Complaint  Patient presents with  . Abscess    Penny Berger is a 44 y.o. adult.  44 year old female presents with complaint of a hole in her right armpit for the past 2 to 3 days with purulent drainage.  Patient has a prior history of abscesses in the same area.  Patient was at work today and showed the nurse at work who told her to go to the emergency room.  No fevers, no other complaints or concerns.        Past Medical History:  Diagnosis Date  . Seizures ([place])   . Stroke ([place])   . Syncope     There are no problems to display for this patient.   Past Surgical History:  Procedure Laterality Date  . APPENDECTOMY    . CESAREAN SECTION       OB History   No obstetric history on file.     No family history on file.  Social History   Tobacco Use  . Smoking status: Current Every Day Smoker    Packs/day: 0.50  . Smokeless tobacco: Never Used  Substance Use Topics  . Alcohol use: Yes  . Drug use: No    Home Medications Prior to Admission medications   Medication Sig Start Date End Date Taking? Authorizing Provider  doxycycline (VIBRAMYCIN) 100 MG capsule Take 1 capsule (100 mg total) by mouth 2 (two) times daily. 08/04/19   Tacy Learn, PA-C  HYDROcodone-acetaminophen (NORCO/VICODIN) 5-325 MG tablet Take 1-2 tablets by mouth every 6 (six) hours as needed for moderate pain. 09/09/18   Lajean Saver, MD  medroxyPROGESTERone (DEPO-PROVERA) 150 MG/ML injection Inject 150 mg into the muscle every 3 (three) months.    [provider]  ondansetron (ZOFRAN ODT) 8 MG disintegrating tablet Take 1 tablet (8 mg total) by mouth every 8 (eight) hours as needed for nausea or vomiting. 09/09/18   Lajean Saver, MD  promethazine (PHENERGAN) 25 MG tablet Take 1 tablet (25 mg total) by mouth every 6 (six) hours as needed for  nausea or vomiting. Patient not taking: Reported on 01/20/2018 09/17/17   Okey Regal, PA-C    Allergies    Patient has no known allergies.  Review of Systems   Review of Systems  Constitutional: Negative for fever.  Musculoskeletal: Negative for arthralgias and myalgias.  Skin: Positive for wound.  Allergic/Immunologic: Negative for immunocompromised state.  Hematological: Negative for adenopathy.  Psychiatric/Behavioral: Negative for confusion.    Physical Exam Updated Vital Signs BP 132/87 (BP Location: Right Arm)   Pulse (!) 113   Temp 98.3 F (36.8 C) (Oral)   Resp 16   SpO2 94%   Physical Exam Vitals and nursing note reviewed.  Constitutional:      General: He is not in acute distress.    Appearance: He is well-developed. He is not diaphoretic.  HENT:     Head: Normocephalic and atraumatic.  Pulmonary:     Effort: Pulmonary effort is normal.  Skin:    General: Skin is warm and dry.     Findings: No erythema.     Comments: Suspect abscess to right axilla open and draining, no purulent drainage at this time.  Neurological:     Mental Status: He is alert and oriented to person, place, and time.  Psychiatric:        Behavior:  Behavior normal.     ED Results / Procedures / Treatments   Labs (all labs ordered are listed, but only abnormal results are displayed) Labs Reviewed - No data to display  EKG None  Radiology No results found.  Procedures Procedures (including critical care time)  Medications Ordered in ED Medications - No data to display  ED Course  I have reviewed the triage vital signs and the nursing notes.  Pertinent labs & imaging results that were available during my care of the patient were reviewed by me and considered in my medical decision making (see chart for details).  Clinical Course as of Aug 04 1723  Thu Aug 03, 7810  144 44 year old female with complaint of hole in the right axilla with drainage.  On exam, has open area  in the right axilla appears consistent with a recently opened abscess.  Recommend warm compresses, will treat with antibiotics, recommend recheck with PCP.   [LM]    Clinical Course User Index [LM] Roque Lias   MDM Rules/Calculators/A&P                      Final Clinical Impression(s) / ED Diagnoses Final diagnoses:  Abscess of axilla, right    Rx / DC Orders ED Discharge Orders         Ordered    doxycycline (VIBRAMYCIN) 100 MG capsule  2 times daily     08/04/19 1721           Tacy Learn, PA-C 08/04/19 1725    Wyvonnia Dusky, MD 08/04/19 [phone]

## 2019-08-04 NOTE — ED Triage Notes (Signed)
Pt states she noticed an abscess two days ago under her right arm. Pt complains of drainage and pain today.

## 2019-08-04 NOTE — Discharge Instructions (Signed)
Warm compresses to the area.  Take antibiotics as prescribed and complete the full course.  Follow-up with your primary care provider.

## 2019-08-31 ENCOUNTER — Emergency Department (HOSPITAL_COMMUNITY)
Admission: EM | Admit: 2019-08-31 | Discharge: 2019-08-31 | Disposition: A | Discharge status: Home / Self Care | Payer: PRIVATE HEALTH INSURANCE | Attending: Emergency Medicine | Admitting: Emergency Medicine

## 2019-08-31 ENCOUNTER — Other Ambulatory Visit: Payer: Self-pay

## 2019-08-31 ENCOUNTER — Encounter (HOSPITAL_COMMUNITY): Payer: Self-pay | Admitting: Emergency Medicine

## 2019-08-31 DIAGNOSIS — R300 Dysuria: Secondary | ICD-10-CM | POA: Insufficient documentation

## 2019-08-31 DIAGNOSIS — L089 Local infection of the skin and subcutaneous tissue, unspecified: Secondary | ICD-10-CM | POA: Insufficient documentation

## 2019-08-31 DIAGNOSIS — R3915 Urgency of urination: Secondary | ICD-10-CM | POA: Insufficient documentation

## 2019-08-31 DIAGNOSIS — Z8673 Personal history of transient ischemic attack (TIA), and cerebral infarction without residual deficits: Secondary | ICD-10-CM | POA: Insufficient documentation

## 2019-08-31 DIAGNOSIS — R350 Frequency of micturition: Secondary | ICD-10-CM | POA: Insufficient documentation

## 2019-08-31 DIAGNOSIS — F17210 Nicotine dependence, cigarettes, uncomplicated: Secondary | ICD-10-CM | POA: Insufficient documentation

## 2019-08-31 LAB — URINALYSIS, ROUTINE W REFLEX MICROSCOPIC
Bilirubin Urine: NEGATIVE
Glucose, UA: NEGATIVE mg/dL
Hgb urine dipstick: NEGATIVE
Ketones, ur: NEGATIVE mg/dL
Leukocytes,Ua: NEGATIVE
Nitrite: NEGATIVE
Protein, ur: NEGATIVE mg/dL
Specific Gravity, Urine: 1.026 (ref 1.005–1.030)
pH: 5 (ref 5.0–8.0)

## 2019-08-31 MED ORDER — CEPHALEXIN 500 MG PO CAPS: 500.0000 mg | ORAL_CAPSULE | Freq: Three times a day (TID) | ORAL | 0 refills | Status: DC

## 2019-08-31 MED ORDER — PHENAZOPYRIDINE HCL 200 MG PO TABS: 200.0000 mg | ORAL_TABLET | Freq: Three times a day (TID) | ORAL | 0 refills | Status: DC

## 2019-08-31 NOTE — ED Provider Notes (Signed)
[name] DEPT Provider Note   CSN: [id number] Arrival date & time: 08/31/19  0913     History Chief Complaint  Patient presents with  . Abscess    Penny Berger is a 44 y.o. adult.  Patient presents emergency department with a skin complaint in the right axillary area.  Patient was seen in the emergency department on 08/04/2019.  Patient had a small opening in the left axilla with purulent drainage at that time.  She was treated with doxycycline.  Patient continues to have a nonhealing opening in the right axilla.  Patient denies any recent drainage.  No pain, fevers.  She presents today for reevaluation.  In addition, she has had 24 hours of increased urinary frequency and urgency with dysuria.  She states it feels like a urinary tract infection.  No vaginal bleeding or discharge or concern for sexually transmitted infection.        Past Medical History:  Diagnosis Date  . Seizures ([place])   . Stroke ([place])   . Syncope     There are no problems to display for this patient.   Past Surgical History:  Procedure Laterality Date  . APPENDECTOMY    . CESAREAN SECTION       OB History   No obstetric history on file.     No family history on file.  Social History   Tobacco Use  . Smoking status: Current Every Day Smoker    Packs/day: 0.50  . Smokeless tobacco: Never Used  Substance Use Topics  . Alcohol use: Yes  . Drug use: No    Home Medications Prior to Admission medications   Medication Sig Start Date End Date Taking? Authorizing Provider  doxycycline (VIBRAMYCIN) 100 MG capsule Take 1 capsule (100 mg total) by mouth 2 (two) times daily. 08/04/19   Tacy Learn, PA-C  HYDROcodone-acetaminophen (NORCO/VICODIN) 5-325 MG tablet Take 1-2 tablets by mouth every 6 (six) hours as needed for moderate pain. 09/09/18   Lajean Saver, MD  medroxyPROGESTERone (DEPO-PROVERA) 150 MG/ML injection Inject 150 mg into the muscle every 3  (three) months.    [provider]  ondansetron (ZOFRAN ODT) 8 MG disintegrating tablet Take 1 tablet (8 mg total) by mouth every 8 (eight) hours as needed for nausea or vomiting. 09/09/18   Lajean Saver, MD  promethazine (PHENERGAN) 25 MG tablet Take 1 tablet (25 mg total) by mouth every 6 (six) hours as needed for nausea or vomiting. Patient not taking: Reported on 01/20/2018 09/17/17   Okey Regal, PA-C    Allergies    Patient has no known allergies.  Review of Systems   Review of Systems  Constitutional: Negative for fever.  Gastrointestinal: Negative for nausea and vomiting.  Genitourinary: Positive for dysuria, frequency and urgency. Negative for flank pain and hematuria.  Skin: Positive for wound. Negative for color change.       Positive for abscess.  Hematological: Negative for adenopathy.    Physical Exam Updated Vital Signs BP (!) 138/92 (BP Location: Left Arm)   Pulse (!) 124   Temp 98.6 F (37 C) (Oral)   Resp 18   SpO2 100%   Physical Exam Vitals and nursing note reviewed.  Constitutional:      Appearance: He is well-developed.  HENT:     Head: Normocephalic and atraumatic.  Eyes:     Conjunctiva/sclera: Conjunctivae normal.  Pulmonary:     Effort: No respiratory distress.  Musculoskeletal:     Cervical  back: Normal range of motion and neck supple.  Skin:    General: Skin is warm and dry.     Comments: Patient with approximately 5 mm opening of the skin in the right axilla.  Subcutaneous tissue visualized through opening.  No significant drainage expressed with palpation.  No surrounding erythema or cellulitis.  Neurological:     Mental Status: He is alert.     ED Results / Procedures / Treatments   Labs (all labs ordered are listed, but only abnormal results are displayed) Labs Reviewed  URINALYSIS, ROUTINE W REFLEX MICROSCOPIC - Abnormal; Notable for the following components:      Result Value   APPearance HAZY (*)    All other  components within normal limits    EKG None  Radiology No results found.  Procedures Procedures (including critical care time)  Medications Ordered in ED Medications - No data to display  ED Course  I have reviewed the triage vital signs and the nursing notes.  Pertinent labs & imaging results that were available during my care of the patient were reviewed by me and considered in my medical decision making (see chart for details).   Patient seen and examined.  Evaluated the small opening.  No active infection suspected.  This appears to be healing secondarily.  Given tension on the area, patient educated that she should see gradual improvement but the area may take several more weeks to fully heal.  Will check UA for UTI.  Vital signs reviewed and are as follows: BP (!) 138/92 (BP Location: Left Arm)   Pulse (!) 124   Temp 98.6 F (37 C) (Oral)   Resp 18   SpO2 100%   Urine is hazy, otherwise normal dipstick.  We will treat for clinical UTI given her symptoms.  Encouraged PCP follow-up if not improving.  Home with Keflex, Pyridium.  BP 123/88 (BP Location: Left Arm)   Pulse 80   Temp 98.6 F (37 C) (Oral)   Resp 16   SpO2 100%   The patient was urged to return to the Emergency Department immediately with worsening of current symptoms, worsening abdominal pain, persistent vomiting, blood noted in stools, fever, or any other concerns. The patient verbalized understanding.     MDM Rules/Calculators/A&P                      Axillary wound: healing, coverage with keflex  UTI sx: clinical UTI, dipstick ok, no vaginal sx, cover with keflex.   Final Clinical Impression(s) / ED Diagnoses Final diagnoses:  Skin infection  Dysuria    Rx / DC Orders ED Discharge Orders         Ordered    cephALEXin (KEFLEX) 500 MG capsule  3 times daily     08/31/19 1024    phenazopyridine (PYRIDIUM) 200 MG tablet  3 times daily     08/31/19 1024           Carlisle Cater,  PA-C 08/31/19 [place], Ankit, MD 09/01/19 1056

## 2019-08-31 NOTE — ED Triage Notes (Signed)
Per pt, states she was seen for an abscess on 2/18-states she was placed on doxy-states there is a "hole within a hole" under right arm-states it has not healed properly-not sure if she took full course of antibiotics

## 2019-08-31 NOTE — Discharge Instructions (Signed)
Please read and follow all provided instructions.  Your diagnoses today include:  1. Skin infection   2. Dysuria    Tests performed today include:  Urine test  Vital signs. See below for your results today.   Medications prescribed:   Keflex (cephalexin) - antibiotic  You have been prescribed an antibiotic medicine: take the entire course of medicine even if you are feeling better. Stopping early can cause the antibiotic not to work.   Pyridium - medication for urinary tract infection symptoms.   This medication will turn your urine orange. This is normal.   Home care instructions:  Follow any educational materials contained in this packet.  Follow-up instructions: Please follow-up with your primary care provider in 3 days if symptoms are not resolved for further evaluation of your urinary symptoms if not improved.  Return instructions:   Please return to the Emergency Department if you experience worsening symptoms.   Return with fever, worsening pain, persistent vomiting, worsening pain in your back.   Please return if you have any other emergent concerns.  Additional Information:  Your vital signs today were: BP (!) 138/92 (BP Location: Left Arm)   Pulse (!) 124   Temp 98.6 F (37 C) (Oral)   Resp 18   SpO2 100%  If your blood pressure (BP) was elevated above 135/85 this visit, please have this repeated by your doctor within one month. --------------

## 2019-12-25 ENCOUNTER — Emergency Department (HOSPITAL_COMMUNITY)
Admission: EM | Admit: 2019-12-25 | Discharge: 2019-12-25 | Disposition: A | Discharge status: Home / Self Care | Payer: Self-pay | Attending: Emergency Medicine | Admitting: Emergency Medicine

## 2019-12-25 ENCOUNTER — Other Ambulatory Visit: Payer: Self-pay

## 2019-12-25 DIAGNOSIS — Y9389 Activity, other specified: Secondary | ICD-10-CM | POA: Insufficient documentation

## 2019-12-25 DIAGNOSIS — F17210 Nicotine dependence, cigarettes, uncomplicated: Secondary | ICD-10-CM | POA: Insufficient documentation

## 2019-12-25 DIAGNOSIS — W458XXA Other foreign body or object entering through skin, initial encounter: Secondary | ICD-10-CM | POA: Insufficient documentation

## 2019-12-25 DIAGNOSIS — Y999 Unspecified external cause status: Secondary | ICD-10-CM | POA: Insufficient documentation

## 2019-12-25 DIAGNOSIS — J45909 Unspecified asthma, uncomplicated: Secondary | ICD-10-CM | POA: Insufficient documentation

## 2019-12-25 DIAGNOSIS — S0501XA Injury of conjunctiva and corneal abrasion without foreign body, right eye, initial encounter: Secondary | ICD-10-CM | POA: Insufficient documentation

## 2019-12-25 DIAGNOSIS — Y9289 Other specified places as the place of occurrence of the external cause: Secondary | ICD-10-CM | POA: Insufficient documentation

## 2019-12-25 MED ORDER — ERYTHROMYCIN 5 MG/GM OP OINT: TOPICAL_OINTMENT | Freq: Once | OPHTHALMIC | Status: DC

## 2019-12-25 MED ORDER — ERYTHROMYCIN 5 MG/GM OP OINT: TOPICAL_OINTMENT | OPHTHALMIC | 0 refills | Status: DC

## 2019-12-25 MED ORDER — FLUORESCEIN SODIUM 1 MG OP STRP
1.0000 | ORAL_STRIP | Freq: Once | OPHTHALMIC | Status: AC
  Administered 2019-12-25: 1 via OPHTHALMIC
  Filled 2019-12-25: qty 1

## 2019-12-25 MED ORDER — TETRACAINE HCL 0.5 % OP SOLN
2.0000 [drp] | Freq: Once | OPHTHALMIC | Status: AC
  Administered 2019-12-25: 2 [drp] via OPHTHALMIC
  Filled 2019-12-25: qty 4

## 2019-12-25 MED ORDER — ERYTHROMYCIN 5 MG/GM OP OINT
TOPICAL_OINTMENT | Freq: Once | OPHTHALMIC | Status: AC
  Filled 2019-12-25: qty 3.5

## 2019-12-25 NOTE — ED Triage Notes (Signed)
Patient reports she was at work last Friday and turned her head to the side. When she did, her wig got into her eye and knocked her eyelashes off. Eyelashes then fell into her eye. Patient has pain 6/10 in right eye. Right eye red and painful. Patient states she thinks she has a corneal abrasion. States she can see out of eye fine.

## 2019-12-25 NOTE — Discharge Instructions (Addendum)
Use the ointment on your right eye.  Follow up with Opthalmology if symptoms

## 2019-12-25 NOTE — ED Provider Notes (Signed)
[facility] DEPT Provider Note   CSN: [id number] Arrival date & time: 12/25/19  1405    History Chief Complaint  Patient presents with   Eye Injury    Penny Berger is a 44 y.o. adult with asthma history significant for CVA, seizures who presents for evaluation of eye pain.  Patient states yesterday the wind was blowing and blew her leg into her eyes.  Patient states her fake eyelashes went into her eyes as well.  Has had a scratching sensation and clear drainage from her right eye.  She denies any eye pain, vision changes, purulent drainage.  No pain with EOMs.  No facial edema, erythema or warmth.  Does not wear contact lenses or corrective lenses.  Denies additional aggravating or relieving factors.  Not followed by ophthalmology  History obtained from patient and past medical records.  No interpreter used.  HPI     Past Medical History:  Diagnosis Date   Seizures ([place])    Stroke ([place])    Syncope     There are no problems to display for this patient.   Past Surgical History:  Procedure Laterality Date   APPENDECTOMY     CESAREAN SECTION       OB History   No obstetric history on file.     No family history on file.  Social History   Tobacco Use   Smoking status: Current Every Day Smoker    Packs/day: 0.50   Smokeless tobacco: Never Used  Substance Use Topics   Alcohol use: Yes   Drug use: No    Home Medications Prior to Admission medications   Medication Sig Start Date End Date Taking? Authorizing Provider  cephALEXin (KEFLEX) 500 MG capsule Take 1 capsule (500 mg total) by mouth 3 (three) times daily. 08/31/19   Carlisle Cater, PA-C  erythromycin ophthalmic ointment Place a 1/2 inch ribbon of ointment into the lower eyelid. 12/25/19   Rumaisa Schnetzer A, PA-C  HYDROcodone-acetaminophen (NORCO/VICODIN) 5-325 MG tablet Take 1-2 tablets by mouth every 6 (six) hours as needed for moderate pain. 09/09/18   Lajean Saver, MD  medroxyPROGESTERone (DEPO-PROVERA) 150 MG/ML injection Inject 150 mg into the muscle every 3 (three) months.    [provider]  ondansetron (ZOFRAN ODT) 8 MG disintegrating tablet Take 1 tablet (8 mg total) by mouth every 8 (eight) hours as needed for nausea or vomiting. 09/09/18   Lajean Saver, MD  phenazopyridine (PYRIDIUM) 200 MG tablet Take 1 tablet (200 mg total) by mouth 3 (three) times daily. 08/31/19   Carlisle Cater, PA-C  promethazine (PHENERGAN) 25 MG tablet Take 1 tablet (25 mg total) by mouth every 6 (six) hours as needed for nausea or vomiting. Patient not taking: Reported on 01/20/2018 09/17/17   Okey Regal, PA-C    Allergies    Patient has no known allergies.  Review of Systems   Review of Systems  Constitutional: Negative.   HENT: Negative.   Eyes: Positive for discharge (Clear) and redness.       Scratching sensation to eye  Musculoskeletal: Negative.   Skin: Negative.   Neurological: Negative.   All other systems reviewed and are negative.   Physical Exam Updated Vital Signs BP 128/90 (BP Location: Left Arm)    Pulse (!) 108    Temp 98.2 F (36.8 C) (Oral)    Resp 17    Ht 5\' 5"  (1.651 m)    Wt 76.7 kg    SpO2 99%  BMI 28.14 kg/m   Physical Exam Vitals and nursing note reviewed.  Constitutional:      General: He is not in acute distress.    Appearance: He is well-developed. He is not ill-appearing, toxic-appearing or diaphoretic.  HENT:     Head: Normocephalic and atraumatic.     Nose: Nose normal.     Mouth/Throat:     Mouth: Mucous membranes are moist.  Eyes:     General: Vision grossly intact. No allergic shiner, visual field deficit or scleral icterus.    Intraocular pressure: Right eye pressure is 14 mmHg. Left eye pressure is 15 mmHg.     Extraocular Movements: Extraocular movements intact.     Pupils: Pupils are equal, round, and reactive to light.     Slit lamp exam:    Right eye: Anterior chamber quiet.     Left eye:  Anterior chamber quiet.     Visual Fields: Right eye visual fields normal and left eye visual fields normal.      Comments: Injected right sclera. Clear drainage to right eye.  Negative Seidel sign.  Positive fluorescein uptake to corneal abrasion.  No evidence of corneal ulcerations.  PERRLA.  No pain with eye movement.  Patient did have #2 fake eyelashes underneath her right eyelid  Neck:     Trachea: Phonation normal.  Cardiovascular:     Rate and Rhythm: Normal rate.     Pulses: Normal pulses.     Heart sounds: Normal heart sounds.  Pulmonary:     Effort: Pulmonary effort is normal. No respiratory distress.     Breath sounds: Normal breath sounds.  Abdominal:     General: Bowel sounds are normal. There is no distension.  Musculoskeletal:        General: Normal range of motion.     Cervical back: Full passive range of motion without pain, normal range of motion and neck supple.  Lymphadenopathy:     Cervical: No cervical adenopathy.  Skin:    General: Skin is warm and dry.  Neurological:     Mental Status: He is alert.     ED Results / Procedures / Treatments   Labs (all labs ordered are listed, but only abnormal results are displayed) Labs Reviewed - No data to display  EKG None  Radiology No results found.  Procedures .Foreign Body Removal  Date/Time: 12/25/2019 4:22 PM Performed by: Nettie Elm, PA-C Authorized by: Nettie Elm, PA-C  Body area: eye Location details: right eyelid  Sedation: Patient sedated: no  Patient restrained: no Patient cooperative: yes Localization method: eyelid eversion Removal mechanism: irrigation and eyelid eversion Eye examined with fluorescein. Fluorescein uptake. Corneal abrasion size: small Corneal abrasion location: lateral No residual rust ring present. Dressing: antibiotic ointment Depth: superficial Complexity: simple 2 objects recovered. Objects recovered: foreign body removed Post-procedure  assessment: foreign body removed Patient tolerance: patient tolerated the procedure well with no immediate complications   (including critical care time)  Medications Ordered in ED Medications  fluorescein ophthalmic strip 1 strip (1 strip Both Eyes Given 12/25/19 1621)  tetracaine (PONTOCAINE) 0.5 % ophthalmic solution 2 drop (2 drops Both Eyes Given 12/25/19 1621)  erythromycin ophthalmic ointment ( Right Eye Given 12/25/19 1621)    ED Course  I have reviewed the triage vital signs and the nursing notes.  Pertinent labs & imaging results that were available during my care of the patient were reviewed by me and considered in my medical decision making (see chart for  details).  44 year old female presents for evaluation of foreign body sensation and scratchy right eye with clear drainage after her leg pushing her fake eyelashes anteriorly at night.  No vision changes.  Does have erythematous sclera with clear drainage.  Negative Seidel sign.  She does have fluorescein uptake to corneal abrasion to right lateral eye approximately 3 mm.  I did remove to fake eyelashes from underneath her right eyelid.  Tdap up to date. FB removed. No change in vision, acuity equal bilaterally.  Pt is not a contact lens wearer.  Exam non-concerning for orbital cellulitis, hyphema, corneal ulcers. Patient will be discharged home with erythromycin.   Patient understands to follow up with ophthalmology, & to return to ER if new symptoms develop including change in vision, purulent drainage, or entrapment.   The patient has been appropriately medically screened and/or stabilized in the ED. I have low suspicion for any other emergent medical condition which would require further screening, evaluation or treatment in the ED or require inpatient management.  Patient is hemodynamically stable and in no acute distress.  Patient able to ambulate in department prior to ED.  Evaluation does not show acute pathology that would  require ongoing or additional emergent interventions while in the emergency department or further inpatient treatment.  I have discussed the diagnosis with the patient and answered all questions.  Pain is been managed while in the emergency department and patient has no further complaints prior to discharge.  Patient is comfortable with plan discussed in room and is stable for discharge at this time.  I have discussed strict return precautions for returning to the emergency department.  Patient was encouraged to follow-up with PCP/specialist refer to at discharge.    MDM Rules/Calculators/A&P                           Final Clinical Impression(s) / ED Diagnoses Final diagnoses:  Abrasion of right cornea, initial encounter    Rx / DC Orders ED Discharge Orders         Ordered    erythromycin ophthalmic ointment     Discontinue  Reprint     12/25/19 1606           Jacari Kirsten A, PA-C 12/25/19 1623    Lacretia Leigh, MD 12/27/19 1639

## 2020-09-24 ENCOUNTER — Emergency Department (HOSPITAL_BASED_OUTPATIENT_CLINIC_OR_DEPARTMENT_OTHER): Payer: BC Managed Care – PPO | Admitting: Radiology

## 2020-09-24 ENCOUNTER — Emergency Department (HOSPITAL_BASED_OUTPATIENT_CLINIC_OR_DEPARTMENT_OTHER)
Admission: EM | Admit: 2020-09-24 | Discharge: 2020-09-24 | Discharge status: Against Medical Advice | Payer: BC Managed Care – PPO | Attending: Emergency Medicine | Admitting: Emergency Medicine

## 2020-09-24 ENCOUNTER — Encounter (HOSPITAL_BASED_OUTPATIENT_CLINIC_OR_DEPARTMENT_OTHER): Payer: Self-pay

## 2020-09-24 ENCOUNTER — Other Ambulatory Visit: Payer: Self-pay

## 2020-09-24 DIAGNOSIS — F17200 Nicotine dependence, unspecified, uncomplicated: Secondary | ICD-10-CM | POA: Insufficient documentation

## 2020-09-24 DIAGNOSIS — R0789 Other chest pain: Secondary | ICD-10-CM | POA: Diagnosis not present

## 2020-09-24 DIAGNOSIS — R079 Chest pain, unspecified: Secondary | ICD-10-CM | POA: Diagnosis present

## 2020-09-24 DIAGNOSIS — R000 Tachycardia, unspecified: Secondary | ICD-10-CM | POA: Insufficient documentation

## 2020-09-24 DIAGNOSIS — R0602 Shortness of breath: Secondary | ICD-10-CM | POA: Diagnosis not present

## 2020-09-24 LAB — TROPONIN I (HIGH SENSITIVITY): Troponin I (High Sensitivity): 3 ng/L (ref <18)

## 2020-09-24 LAB — URINALYSIS, ROUTINE W REFLEX MICROSCOPIC
Bilirubin Urine: NEGATIVE
Glucose, UA: NEGATIVE mg/dL
Hgb urine dipstick: NEGATIVE
Leukocytes,Ua: NEGATIVE
Nitrite: NEGATIVE
Specific Gravity, Urine: 1.031 — ABNORMAL HIGH (ref 1.005–1.030)
pH: 6 (ref 5.0–8.0)

## 2020-09-24 LAB — HEPATIC FUNCTION PANEL
ALT: 8 U/L (ref 0–44)
AST: 12 U/L — ABNORMAL LOW (ref 15–41)
Albumin: 4.5 g/dL (ref 3.5–5.0)
Alkaline Phosphatase: 40 U/L (ref 38–126)
Bilirubin, Direct: 0.1 mg/dL (ref 0.0–0.2)
Indirect Bilirubin: 0.5 mg/dL (ref 0.3–0.9)
Total Bilirubin: 0.6 mg/dL (ref 0.3–1.2)
Total Protein: 7.4 g/dL (ref 6.5–8.1)

## 2020-09-24 LAB — BASIC METABOLIC PANEL
Anion gap: 12 (ref 5–15)
BUN: 11 mg/dL (ref 6–20)
CO2: 20 mmol/L — ABNORMAL LOW (ref 22–32)
Calcium: 9 mg/dL (ref 8.9–10.3)
Chloride: 108 mmol/L (ref 98–111)
Creatinine, Ser: 0.7 mg/dL (ref 0.44–1.00)
GFR, Estimated: >60 mL/min (ref >60)
Glucose, Bld: 119 mg/dL — ABNORMAL HIGH (ref 70–99)
Potassium: 3.3 mmol/L — ABNORMAL LOW (ref 3.5–5.1)
Sodium: 140 mmol/L (ref 135–145)

## 2020-09-24 LAB — CBC
HCT: 41.9 % (ref 36.0–46.0)
Hemoglobin: 13.9 g/dL (ref 12.0–15.0)
MCH: 30.3 pg (ref 26.0–34.0)
MCHC: 33.2 g/dL (ref 30.0–36.0)
MCV: 91.5 fL (ref 80.0–100.0)
Platelets: 385 10*3/uL (ref 150–400)
RBC: 4.58 MIL/uL (ref 3.87–5.11)
RDW: 14.5 % (ref 11.5–15.5)
WBC: 5 10*3/uL (ref 4.0–10.5)
nRBC: 0 % (ref 0.0–0.2)

## 2020-09-24 LAB — URINALYSIS, MICROSCOPIC (REFLEX): Bacteria, UA: NONE SEEN

## 2020-09-24 LAB — PREGNANCY, URINE: Preg Test, Ur: NEGATIVE

## 2020-09-24 LAB — D-DIMER, QUANTITATIVE: D-Dimer, Quant: 0.33 ug/mL-FEU (ref 0.00–0.50)

## 2020-09-24 MED ORDER — KETOROLAC TROMETHAMINE 30 MG/ML IJ SOLN
30.0000 mg | Freq: Once | INTRAMUSCULAR | Status: AC
  Administered 2020-09-24: 30 mg via INTRAVENOUS
  Filled 2020-09-24: qty 1

## 2020-09-24 NOTE — ED Triage Notes (Signed)
Pt reports Chest pain with intermittent sob x few days. Pt reports mid sternal chest pain 7/10 at this time.Pt report radiation to the left chest yesterday, but not now. Pt denies any n/v

## 2020-09-24 NOTE — ED Provider Notes (Signed)
[facility] EMER[facility]ENC[facility] DEPT Provider Note   CSN: [id number] Arrival date & time: 09/24/20  1638     History Chief Complaint  Patient presents with  . Chest Pain    Penny Berger is a 45 y.o. female.  Pt presents to the ED today with cp and sob.  Pt said it's been going on for a few days.  She said it comes and goes.  She denies pain now.  The pt denies f/c.          Past Medical History:  Diagnosis Date  . Seizures ([place])   . Stroke ([place])   . Syncope     There are no problems to display for this patient.   Past Surgical History:  Procedure Laterality Date  . APPENDECTOMY    . CESAREAN SECTION       OB History   No obstetric history on file.     History reviewed. No pertinent family history.  Social History   Tobacco Use  . Smoking status: Current Every Day Smoker    Packs/day: 0.50  . Smokeless tobacco: Never Used  Substance Use Topics  . Alcohol use: Yes  . Drug use: No    Home Medications Prior to Admission medications   Medication Sig Start Date End Date Taking? Authorizing Provider  cephALEXin (KEFLEX) 500 MG capsule Take 1 capsule (500 mg total) by mouth 3 (three) times daily. 08/31/19   Carlisle Cater, PA-C  erythromycin ophthalmic ointment Place a 1/2 inch ribbon of ointment into the lower eyelid. 12/25/19   Henderly, Britni A, PA-C  HYDROcodone-acetaminophen (NORCO/VICODIN) 5-325 MG tablet Take 1-2 tablets by mouth every 6 (six) hours as needed for moderate pain. 09/09/18   Lajean Saver, MD  medroxyPROGESTERone (DEPO-PROVERA) 150 MG/ML injection Inject 150 mg into the muscle every 3 (three) months.    [provider]  ondansetron (ZOFRAN ODT) 8 MG disintegrating tablet Take 1 tablet (8 mg total) by mouth every 8 (eight) hours as needed for nausea or vomiting. 09/09/18   Lajean Saver, MD  phenazopyridine (PYRIDIUM) 200 MG tablet Take 1 tablet (200 mg total) by mouth 3 (three) times daily. 08/31/19   Carlisle Cater, PA-C   promethazine (PHENERGAN) 25 MG tablet Take 1 tablet (25 mg total) by mouth every 6 (six) hours as needed for nausea or vomiting. Patient not taking: Reported on 01/20/2018 09/17/17   Okey Regal, PA-C    Allergies    Patient has no known allergies.  Review of Systems   Review of Systems  Respiratory: Positive for shortness of breath.   Cardiovascular: Positive for chest pain.  All other systems reviewed and are negative.   Physical Exam Updated Vital Signs BP 114/85   Pulse 79   Temp 98.8 F (37.1 C) (Oral)   Resp 19   Ht 5\' 4"  (1.626 m)   Wt 72.6 kg   SpO2 100%   BMI 27.46 kg/m   Physical Exam Vitals and nursing note reviewed.  Constitutional:      Appearance: She is well-developed.  HENT:     Head: Normocephalic and atraumatic.  Eyes:     Extraocular Movements: Extraocular movements intact.     Pupils: Pupils are equal, round, and reactive to light.  Cardiovascular:     Rate and Rhythm: Regular rhythm. Tachycardia present.     Heart sounds: Normal heart sounds.  Pulmonary:     Effort: Pulmonary effort is normal.     Breath sounds: Normal breath sounds.  Abdominal:  General: Bowel sounds are normal.     Palpations: Abdomen is soft.  Musculoskeletal:        General: Normal range of motion.     Cervical back: Normal range of motion and neck supple.  Skin:    General: Skin is warm.     Capillary Refill: Capillary refill takes less than 2 seconds.  Neurological:     General: No focal deficit present.     Mental Status: She is alert and oriented to person, place, and time.  Psychiatric:        Mood and Affect: Mood normal.        Behavior: Behavior normal.     ED Results / Procedures / Treatments   Labs (all labs ordered are listed, but only abnormal results are displayed) Labs Reviewed  BASIC METABOLIC PANEL - Abnormal; Notable for the following components:      Result Value   Potassium 3.3 (*)    CO2 20 (*)    Glucose, Bld 119 (*)    All other  components within normal limits  HEPATIC FUNCTION PANEL - Abnormal; Notable for the following components:   AST 12 (*)    All other components within normal limits  URINALYSIS, ROUTINE W REFLEX MICROSCOPIC - Abnormal; Notable for the following components:   Specific Gravity, Urine 1.031 (*)    Ketones, ur TRACE (*)    Protein, ur TRACE (*)    All other components within normal limits  CBC  PREGNANCY, URINE  D-DIMER, QUANTITATIVE  URINALYSIS, MICROSCOPIC (REFLEX)  TROPONIN I (HIGH SENSITIVITY)  TROPONIN I (HIGH SENSITIVITY)    EKG EKG Interpretation  Date/Time:  Monday September 24 2020 16:45:43 EDT Ventricular Rate:  112 PR Interval:  126 QRS Duration: 72 QT Interval:  350 QTC Calculation: 477 R Axis:   73 Text Interpretation: Sinus tachycardia Nonspecific T wave abnormality Abnormal ECG No significant change since last tracing Confirmed by Isla Pence [phone]) on 09/24/2020 5:09:18 PM   Radiology DG Chest 2 View  Result Date: 09/24/2020 CLINICAL DATA:  Chest pain with intermittent shortness of breath. EXAM: CHEST - 2 VIEW COMPARISON:  12/20/2018 FINDINGS: The cardiomediastinal contours are normal. The lungs are clear. Pulmonary vasculature is normal. No consolidation, pleural effusion, or pneumothorax. No acute osseous abnormalities are seen. Right nipple piercing in place no as before. The previous left nipple piercing is no longer seen. IMPRESSION: Negative radiographs of the chest. No explanation for chest pain or shortness of breath Electronically Signed   By: Keith Rake M.D.   On: 09/24/2020 17:31    Procedures Procedures   Medications Ordered in ED Medications  ketorolac (TORADOL) 30 MG/ML injection 30 mg (30 mg Intravenous Given 09/24/20 1824)    ED Course  I have reviewed the triage vital signs and the nursing notes.  Pertinent labs & imaging results that were available during my care of the patient were reviewed by me and considered in my medical decision  making (see chart for details).    MDM Rules/Calculators/A&P                          I spoke to the patient and told her that her first troponin, EKG, CXR, and ddimer were negative.  I told her we were going to do a 2nd troponin.  When the nurse went back to get the 2nd trop, she was gone.  Pt did not receive any d/c instructions.   Final Clinical Impression(s) /  ED Diagnoses Final diagnoses:  Atypical chest pain    Rx / DC Orders ED Discharge Orders    None       Isla Pence, MD 09/24/20 2040

## 2020-09-24 NOTE — ED Notes (Signed)
Patient eloped from the ER without anyone witnessing. Patient had an IV in her arm. Patient left before getting second Troponin drawn. Patient called by unit secretary to verify that she had left.

## 2021-02-20 ENCOUNTER — Other Ambulatory Visit: Payer: Self-pay

## 2021-02-21 ENCOUNTER — Encounter (HOSPITAL_BASED_OUTPATIENT_CLINIC_OR_DEPARTMENT_OTHER): Payer: Self-pay | Admitting: *Deleted

## 2021-02-21 ENCOUNTER — Other Ambulatory Visit: Payer: Self-pay

## 2021-02-21 ENCOUNTER — Emergency Department (HOSPITAL_BASED_OUTPATIENT_CLINIC_OR_DEPARTMENT_OTHER)
Admission: EM | Admit: 2021-02-21 | Discharge: 2021-02-21 | Disposition: A | Discharge status: Home / Self Care | Payer: PRIVATE HEALTH INSURANCE | Attending: Emergency Medicine | Admitting: Emergency Medicine

## 2021-02-21 ENCOUNTER — Emergency Department (HOSPITAL_BASED_OUTPATIENT_CLINIC_OR_DEPARTMENT_OTHER): Payer: PRIVATE HEALTH INSURANCE

## 2021-02-21 DIAGNOSIS — M940 Chondrocostal junction syndrome [Tietze]: Secondary | ICD-10-CM | POA: Diagnosis not present

## 2021-02-21 DIAGNOSIS — R030 Elevated blood-pressure reading, without diagnosis of hypertension: Secondary | ICD-10-CM | POA: Diagnosis not present

## 2021-02-21 DIAGNOSIS — R079 Chest pain, unspecified: Secondary | ICD-10-CM | POA: Diagnosis present

## 2021-02-21 DIAGNOSIS — F17210 Nicotine dependence, cigarettes, uncomplicated: Secondary | ICD-10-CM | POA: Diagnosis not present

## 2021-02-21 MED ORDER — IBUPROFEN 600 MG PO TABS: 600.0000 mg | ORAL_TABLET | Freq: Four times a day (QID) | ORAL | 0 refills | Status: DC | PRN

## 2021-02-21 MED ORDER — IBUPROFEN 800 MG PO TABS
800.0000 mg | ORAL_TABLET | Freq: Once | ORAL | Status: AC
  Administered 2021-02-21: 800 mg via ORAL
  Filled 2021-02-21: qty 1

## 2021-02-21 MED ORDER — LIDOCAINE 5 % EX PTCH: 1.0000 | MEDICATED_PATCH | CUTANEOUS | 0 refills | Status: DC

## 2021-02-21 MED ORDER — LIDOCAINE 5 % EX PTCH
1.0000 | MEDICATED_PATCH | CUTANEOUS | Status: DC
  Administered 2021-02-21: 1 via TRANSDERMAL
  Filled 2021-02-21: qty 1

## 2021-02-21 NOTE — ED Provider Notes (Signed)
[facility] EMERGENCY DEPT Provider Note   CSN: [id number] Arrival date & time: 02/21/21  [id number]     History Chief Complaint  Patient presents with   Shoulder Pain    Penny Berger is a 45 y.o. female.  Patient is a 45 yo female presenting for left sided chest/shoulder pain. Patient admits to left chest/shoulder pain x 3 weeks, intermittent, worse with left arm movement, unrelieved with tylenol use. 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 Denies any sob, difficulty breathing, or coughing. Denies falls or recent trauma.   The history is provided by the patient. No language interpreter was used.  Shoulder Pain Location:  Shoulder Shoulder location:  L shoulder Injury: no   Pain details:    Quality:  Aching Associated symptoms: no fever and no neck pain       Past Medical History:  Diagnosis Date   Seizures ([place])    Stroke ([place])    Syncope     There are no problems to display for this patient.   Past Surgical History:  Procedure Laterality Date   APPENDECTOMY     CESAREAN SECTION       OB History   No obstetric history on file.     No family history on file.  Social History   Tobacco Use   Smoking status: Every Day    Packs/day: 0.50    Types: Cigarettes   Smokeless tobacco: Never  Substance Use Topics   Alcohol use: Yes   Drug use: No    Home Medications Prior to Admission medications   Medication Sig Start Date End Date Taking? Authorizing Provider  cephALEXin (KEFLEX) 500 MG capsule Take 1 capsule (500 mg total) by mouth 3 (three) times daily. 08/31/19   Carlisle Cater, PA-C  erythromycin ophthalmic ointment Place a 1/2 inch ribbon of ointment into the lower eyelid. 12/25/19   Henderly, Britni A, PA-C  HYDROcodone-acetaminophen (NORCO/VICODIN) 5-325 MG tablet Take 1-2 tablets by mouth every 6 (six) hours as needed for moderate pain. 09/09/18   Lajean Saver, MD   medroxyPROGESTERone (DEPO-PROVERA) 150 MG/ML injection Inject 150 mg into the muscle every 3 (three) months.    [provider]  ondansetron (ZOFRAN ODT) 8 MG disintegrating tablet Take 1 tablet (8 mg total) by mouth every 8 (eight) hours as needed for nausea or vomiting. 09/09/18   Lajean Saver, MD  phenazopyridine (PYRIDIUM) 200 MG tablet Take 1 tablet (200 mg total) by mouth 3 (three) times daily. 08/31/19   Carlisle Cater, PA-C  promethazine (PHENERGAN) 25 MG tablet Take 1 tablet (25 mg total) by mouth every 6 (six) hours as needed for nausea or vomiting. Patient not taking: Reported on 01/20/2018 09/17/17   Okey Regal, PA-C    Allergies    Patient has no known allergies.  Review of Systems   Review of Systems  Constitutional:  Negative for chills and fever.  Respiratory:  Negative for cough, shortness of breath and wheezing.   Cardiovascular:  Positive for chest pain. Negative for palpitations.  Musculoskeletal:  Negative for neck pain.  Skin:  Negative for rash and wound.  Neurological:  Negative for weakness and numbness.   Physical Exam Updated Vital Signs BP (!) 165/101 (BP Location: Right Arm)   Pulse (!) 123 Comment: pt states "my pulse is always high"  Temp 98.6 F (37 C) (Oral)   Resp 16   Wt 72.6 kg   SpO2 97%  BMI 27.46 kg/m   Physical Exam Vitals and nursing note reviewed.  Constitutional:      Appearance: Normal appearance.  HENT:     Head: Normocephalic and atraumatic.  Cardiovascular:     Rate and Rhythm: Normal rate and regular rhythm.     Pulses:          Radial pulses are 2+ on the right side and 2+ on the left side.  Pulmonary:     Effort: Pulmonary effort is normal. No respiratory distress.     Breath sounds: Normal breath sounds.  Chest:     Chest wall: Tenderness present. No mass, lacerations, deformity, swelling, crepitus or edema.    Musculoskeletal:     Right shoulder: Normal.     Left shoulder: Normal.     Right upper arm:  Normal.     Left upper arm: Normal.     Cervical back: No bony tenderness. No pain with movement.     Thoracic back: No bony tenderness. Normal range of motion.  Skin:    Capillary Refill: Capillary refill takes less than 2 seconds.  Neurological:     General: No focal deficit present.     Mental Status: She is alert and oriented to person, place, and time.     GCS: GCS eye subscore is 4. GCS verbal subscore is 5. GCS motor subscore is 6.     Motor: Motor function is intact.    ED Results / Procedures / Treatments   Labs (all labs ordered are listed, but only abnormal results are displayed) Labs Reviewed - No data to display  EKG None  Radiology No results found.  Procedures Procedures   Medications Ordered in ED Medications  ibuprofen (ADVIL) tablet 800 mg (has no administration in time range)  lidocaine (LIDODERM) 5 % 1 patch (has no administration in time range)    ED Course  I have reviewed the triage vital signs and the nursing notes.  Pertinent labs & imaging results that were available during my care of the patient were reviewed by me and considered in my medical decision making (see chart for details).      7:31 AM 45 yo female presenting for left sided chest/shoulder pain. Pt is Aox3, no acute distress, afebrile, with stable vitals. Physical exam demonstrates reproducible left sided chest pain. Stable CXR and EKG. Pain likely musculoskeletal in nature. Patient given Motrin and Lidoderm pain patch with recommendations to decrease heavy lifting at work for the next 7 days.   Patient in no distress and overall condition improved here in the ED. Prescriptions sent to pharmacy. Detailed discussions were had with the patient regarding current findings, and need for close f/u with PCP. The patient has been instructed to return immediately if the symptoms worsen in any way for re-evaluation. Patient verbalized understanding and is in agreement with current care plan. All  questions answered prior to discharge.         Final Clinical Impression(s) / ED Diagnoses Final diagnoses:  Costochondritis  Elevated blood pressure reading    Rx / DC Orders ED Discharge Orders     None        Lianne Cure, DO [id number] [phone]

## 2021-02-21 NOTE — ED Notes (Signed)
Patient declined EKG at this time. RN made aware and physician made aware. Patient is in no distress.

## 2021-02-21 NOTE — ED Triage Notes (Signed)
Left shoulder pain x "a few weeks" not associated with any trauma.  Pt states that it hurts more with movement and use of arm as well as palpation.  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

## 2021-02-25 ENCOUNTER — Ambulatory Visit (INDEPENDENT_AMBULATORY_CARE_PROVIDER_SITE_OTHER): Payer: PRIVATE HEALTH INSURANCE | Admitting: Orthopaedic Surgery

## 2021-02-25 VITALS — BP 127/63 | Ht 64.5 in | Wt 168.2 lb

## 2021-02-25 DIAGNOSIS — M7522 Bicipital tendinitis, left shoulder: Secondary | ICD-10-CM | POA: Diagnosis not present

## 2021-02-25 MED ORDER — TRIAMCINOLONE ACETONIDE 40 MG/ML IJ SUSP
80.0000 mg | INTRAMUSCULAR | Status: AC | PRN
  Administered 2021-02-25: 80 mg via INTRA_ARTICULAR

## 2021-02-25 MED ORDER — LIDOCAINE HCL 1 % IJ SOLN
4.0000 mL | INTRAMUSCULAR | Status: AC | PRN
  Administered 2021-02-25: 4 mL

## 2021-02-25 NOTE — Progress Notes (Signed)
Chief Complaint: Left shoulder pain     History of Present Illness:   Penny Berger is a 45 y.o. female with left shoulder pain over the front of the shoulder radiating toward the elbow.  She states that this happened 2-1/2 weeks ago.  She did not have any specific injury or incident.  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  She is right-hand dominant.  She has been taking Tylenol which does not help whatsoever.    Surgical History:   None  PMH/PSH/Family History/Social History/Meds/Allergies:    Past Medical History:  Diagnosis Date  . Seizures ([place])   . Stroke ([place])   . Syncope    Past Surgical History:  Procedure Laterality Date  . APPENDECTOMY    . CESAREAN SECTION     Social History   Socioeconomic History  . Marital status: Single    Spouse name: Not on file  . Number of children: Not on file  . Years of education: Not on file  . Highest education level: Not on file  Occupational History  . Not on file  Tobacco Use  . Smoking status: Every Day    Packs/day: 0.50    Types: Cigarettes  . Smokeless tobacco: Never  Substance and Sexual Activity  . Alcohol use: Yes  . Drug use: No  . Sexual activity: Not on file  Other Topics Concern  . Not on file  Social History Narrative  . Not on file   Social Determinants of Health   Financial Resource Strain: Not on file  Food Insecurity: Not on file  Transportation Needs: Not on file  Physical Activity: Not on file  Stress: Not on file  Social Connections: Not on file   No family history on file. No Known Allergies Current Outpatient Medications  Medication Sig Dispense Refill  . cephALEXin (KEFLEX) 500 MG capsule Take 1 capsule (500 mg total) by mouth 3 (three) times daily. 21 capsule 0  . erythromycin ophthalmic ointment Place a 1/2 inch ribbon of ointment into the lower eyelid. 1 g 0  . HYDROcodone-acetaminophen  (NORCO/VICODIN) 5-325 MG tablet Take 1-2 tablets by mouth every 6 (six) hours as needed for moderate pain. 15 tablet 0  . ibuprofen (ADVIL) 600 MG tablet Take 1 tablet (600 mg total) by mouth every 6 (six) hours as needed. 30 tablet 0  . lidocaine (LIDODERM) 5 % Place 1 patch onto the skin daily. Remove & Discard patch within 12 hours or as directed by MD 5 patch 0  . medroxyPROGESTERone (DEPO-PROVERA) 150 MG/ML injection Inject 150 mg into the muscle every 3 (three) months.    . ondansetron (ZOFRAN ODT) 8 MG disintegrating tablet Take 1 tablet (8 mg total) by mouth every 8 (eight) hours as needed for nausea or vomiting. 10 tablet 0  . phenazopyridine (PYRIDIUM) 200 MG tablet Take 1 tablet (200 mg total) by mouth 3 (three) times daily. 6 tablet 0  . promethazine (PHENERGAN) 25 MG tablet Take 1 tablet (25 mg total) by mouth every 6 (six) hours as needed for nausea or vomiting. (Patient not taking: Reported on 01/20/2018) 30 tablet 0   No current facility-administered medications for this visit.   No results found.  Review of Systems:   A ROS was performed  including pertinent positives and negatives as documented in the HPI.  Physical Exam :   Constitutional: NAD and appears stated age Neurological: Alert and oriented Psych: Appropriate affect and cooperative Blood pressure 127/63, height 5' 4.5" (1.638 m), weight 168 lb 3.2 oz (76.3 kg).   Comprehensive Musculoskeletal Exam:    Musculoskeletal Exam    Inspection Right Left  Skin No atrophy or winging No atrophy or winging  Palpation    Tenderness None Left biceps  Range of Motion    Flexion (passive) 170 170  Flexion (active) 180 180  Extension 30 30  Abduction 170 170  ER at the side 70 70  ER at 90 of abduction 90 90  IR at 90 of abduction 60 60  Can reach behind back to T10 T10  Strength     Fall Full  Special Tests    Pseudoparalytic No No  Neurologic    Fires PIN, radial, median, ulnar, musculocutaneous, axillary,  suprascapular, long thoracic, and spinal accessory innervated muscles. No abnormal sensibility  Vascular/Lymphatic    Radial Pulse 2+ 2+  Cervical Exam    Patient has symmetric cervical range of motion with negative Spurling's test.  Special Test: Positive speeds     Imaging:   Xray (left shoulder 3 view): Normal  I personally reviewed and interpreted the radiographs.   Assessment:   45 year old right-hand-dominant female with left shoulder pain consistent with biceps tinnitus.  Given the fact that this has been happening for 2-1/2 weeks, I believe a steroid injection would be helpful to hopefully completely resolve the issue.  We will perform this today and see her back in 4 to 6 weeks  Plan :    -Ultrasound-guided left biceps injection today -Left shoulder MRI if no improvement in 4 to 6 weeks -4 to 6 weeks    Procedure Note  Patient: Penny Berger             Date of Birth: 1975/10/10           MRN: [id number]             Visit Date: 02/25/2021  Procedures: Visit Diagnoses:  1. Biceps tendinitis of left upper extremity     Large Joint Inj: L glenohumeral on 02/25/2021 10:44 AM Indications: pain Details: 22 G 1.5 in needle, ultrasound-guided anterior approach  Arthrogram: No  Medications: 4 mL lidocaine 1 %; 80 mg triamcinolone acetonide 40 MG/ML Outcome: tolerated well, no immediate complications  Left biceps Procedure, treatment alternatives, risks and benefits explained, specific risks discussed. Consent was given by the patient. Immediately prior to procedure a time out was called to verify the correct patient, procedure, equipment, support staff and site/side marked as required. Patient was prepped and draped in the usual sterile fashion.        I personally saw and evaluated the patient, and participated in the management and treatment plan.  Vanetta Mulders, MD Attending Physician, Orthopedic Surgery  This document was dictated using Dragon voice  recognition software. A reasonable attempt at proof reading has been made to minimize errors.

## 2021-03-25 ENCOUNTER — Other Ambulatory Visit (HOSPITAL_BASED_OUTPATIENT_CLINIC_OR_DEPARTMENT_OTHER): Payer: Self-pay | Admitting: Orthopaedic Surgery

## 2021-03-25 ENCOUNTER — Ambulatory Visit (INDEPENDENT_AMBULATORY_CARE_PROVIDER_SITE_OTHER): Payer: PRIVATE HEALTH INSURANCE | Admitting: Orthopaedic Surgery

## 2021-03-25 ENCOUNTER — Ambulatory Visit (HOSPITAL_BASED_OUTPATIENT_CLINIC_OR_DEPARTMENT_OTHER)
Admission: RE | Admit: 2021-03-25 | Discharge: 2021-03-25 | Disposition: A | Discharge status: Home / Self Care | Payer: PRIVATE HEALTH INSURANCE | Source: Ambulatory Visit | Attending: Orthopaedic Surgery | Admitting: Orthopaedic Surgery

## 2021-03-25 ENCOUNTER — Ambulatory Visit (HOSPITAL_COMMUNITY): Admission: RE | Admit: 2021-03-25 | Payer: PRIVATE HEALTH INSURANCE | Source: Ambulatory Visit

## 2021-03-25 ENCOUNTER — Other Ambulatory Visit: Payer: Self-pay

## 2021-03-25 VITALS — Ht 64.5 in | Wt 165.0 lb

## 2021-03-25 DIAGNOSIS — M7522 Bicipital tendinitis, left shoulder: Secondary | ICD-10-CM

## 2021-03-25 DIAGNOSIS — M25522 Pain in left elbow: Secondary | ICD-10-CM

## 2021-03-25 MED ORDER — MELOXICAM 15 MG PO TABS: 15.0000 mg | ORAL_TABLET | Freq: Every day | ORAL | 2 refills | Status: DC

## 2021-03-25 NOTE — Progress Notes (Signed)
Chief Complaint: Left shoulder pain     History of Present Illness:   03/25/2021: Presents today for follow-up of her left proximal biceps tendinitis.  She is no longer experiencing proximal shoulder pain but is now experiencing tenderness along the distal biceps insertion.  This is significantly painful with putting her resting her left arm down.  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  She is taking Tylenol without significant relief  Penny Berger is a 45 y.o. female with left shoulder pain over the front of the shoulder radiating toward the elbow.  She states that this happened 2-1/2 weeks ago.  She did not have any specific injury or incident.  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  She is right-hand dominant.  She has been taking Tylenol which does not help whatsoever.    Surgical History:   None  PMH/PSH/Family History/Social History/Meds/Allergies:    Past Medical History:  Diagnosis Date   Seizures ([place])    Stroke ([place])    Syncope    Past Surgical History:  Procedure Laterality Date   APPENDECTOMY     CESAREAN SECTION     Social History   Socioeconomic History   Marital status: Single    Spouse name: Not on file   Number of children: Not on file   Years of education: Not on file   Highest education level: Not on file  Occupational History   Not on file  Tobacco Use   Smoking status: Every Day    Packs/day: 0.50    Types: Cigarettes   Smokeless tobacco: Never  Substance and Sexual Activity   Alcohol use: Yes   Drug use: No   Sexual activity: Not on file  Other Topics Concern   Not on file  Social History Narrative   Not on file   Social Determinants of Health   Financial Resource Strain: Not on file  Food Insecurity: Not on file  Transportation Needs: Not on file  Physical Activity: Not on file  Stress:  Not on file  Social Connections: Not on file   No family history on file. No Known Allergies Current Outpatient Medications  Medication Sig Dispense Refill   meloxicam (MOBIC) 15 MG tablet Take 1 tablet (15 mg total) by mouth daily. 30 tablet 2   cephALEXin (KEFLEX) 500 MG capsule Take 1 capsule (500 mg total) by mouth 3 (three) times daily. 21 capsule 0   erythromycin ophthalmic ointment Place a 1/2 inch ribbon of ointment into the lower eyelid. 1 g 0   HYDROcodone-acetaminophen (NORCO/VICODIN) 5-325 MG tablet Take 1-2 tablets by mouth every 6 (six) hours as needed for moderate pain. 15 tablet 0   ibuprofen (ADVIL) 600 MG tablet Take 1 tablet (600 mg total) by mouth every 6 (six) hours as needed. 30 tablet 0   lidocaine (LIDODERM) 5 % Place 1 patch onto the skin daily. Remove & Discard patch within 12 hours or as directed by MD 5 patch 0   medroxyPROGESTERone (DEPO-PROVERA) 150 MG/ML injection Inject 150 mg into the muscle every 3 (three) months.     ondansetron (ZOFRAN ODT) 8 MG disintegrating tablet Take 1 tablet (8 mg total) by mouth  every 8 (eight) hours as needed for nausea or vomiting. 10 tablet 0   phenazopyridine (PYRIDIUM) 200 MG tablet Take 1 tablet (200 mg total) by mouth 3 (three) times daily. 6 tablet 0   promethazine (PHENERGAN) 25 MG tablet Take 1 tablet (25 mg total) by mouth every 6 (six) hours as needed for nausea or vomiting. (Patient not taking: Reported on 01/20/2018) 30 tablet 0   No current facility-administered medications for this visit.   No results found.  Review of Systems:   A ROS was performed including pertinent positives and negatives as documented in the HPI.  Physical Exam :   Constitutional: NAD and appears stated age Neurological: Alert and oriented Psych: Appropriate affect and cooperative Height 5' 4.5" (1.638 m), weight 165 lb (74.8 kg).   Comprehensive Musculoskeletal Exam:    Musculoskeletal Exam    Inspection Right Left  Skin No atrophy or  winging No atrophy or winging  Palpation    Tenderness None None  Range of Motion    Flexion (passive) 170 170  Flexion (active) 180 180  Extension 30 30  Abduction 170 170  ER at the side 70 70  ER at 90 of abduction 90 90  IR at 90 of abduction 60 60  Can reach behind back to T10 T10  Strength     Fall Full  Special Tests    Pseudoparalytic No No  Neurologic    Fires PIN, radial, median, ulnar, musculocutaneous, axillary, suprascapular, long thoracic, and spinal accessory innervated muscles. No abnormal sensibility  Vascular/Lymphatic    Radial Pulse 2+ 2+  Cervical Exam    Patient has symmetric cervical range of motion with negative Spurling's test.  Special Test: Positive speeds    Speeds test is positive now with pain distally at the biceps insertion.  Hook test reveals intact biceps tendon..  Full range of motion left elbow, -5-140 degrees, equal to contralateral side    Imaging:   Xray left elbow:  normal  I personally reviewed and interpreted the radiographs.   Assessment:   44 year old right-hand-dominant female with now left distal biceps tendinitis.  She has significant pain about this area.  She has a hard time resting the arm down as significantly interfering with her ability to work.  Given this I would like to perform an MRI of the left elbow to assess for the distal biceps tendon to look for any type of partial tearing or tendinitis.  I have ultimately advised against injection given that there is a risk of rupture.  In the meantime I prescribed her Mobic.  I would like to avoid physical therapy as I would not like her to over aggravate the tendinitis.   Plan :    -Plan for x-ray left elbow -Plan for MRI of left elbow without contrast -Return to clinic in 3 weeks to discuss results   I personally saw and evaluated the patient, and participated in the management and treatment plan.  Vanetta Mulders, MD Attending Physician, Orthopedic Surgery  This  document was dictated using Dragon voice recognition software. A reasonable attempt at proof reading has been made to minimize errors.

## 2021-03-28 ENCOUNTER — Telehealth (HOSPITAL_BASED_OUTPATIENT_CLINIC_OR_DEPARTMENT_OTHER): Payer: Self-pay | Admitting: Orthopaedic Surgery

## 2021-03-28 NOTE — Telephone Encounter (Signed)
Spoke to patient directly 

## 2021-04-08 ENCOUNTER — Other Ambulatory Visit: Payer: Self-pay

## 2021-04-08 ENCOUNTER — Ambulatory Visit
Admission: RE | Admit: 2021-04-08 | Discharge: 2021-04-08 | Disposition: A | Discharge status: Home / Self Care | Payer: PRIVATE HEALTH INSURANCE | Source: Ambulatory Visit | Attending: Orthopaedic Surgery | Admitting: Orthopaedic Surgery

## 2021-04-08 DIAGNOSIS — M25522 Pain in left elbow: Secondary | ICD-10-CM

## 2021-04-11 ENCOUNTER — Ambulatory Visit (INDEPENDENT_AMBULATORY_CARE_PROVIDER_SITE_OTHER): Payer: PRIVATE HEALTH INSURANCE | Admitting: Orthopaedic Surgery

## 2021-04-11 ENCOUNTER — Other Ambulatory Visit: Payer: Self-pay

## 2021-04-11 DIAGNOSIS — G5612 Other lesions of median nerve, left upper limb: Secondary | ICD-10-CM

## 2021-04-11 MED ORDER — METHYLPREDNISOLONE 4 MG PO TBPK: ORAL_TABLET | ORAL | 0 refills | Status: DC

## 2021-04-11 NOTE — Progress Notes (Signed)
Chief Complaint: Left forearm pain     History of Present Illness:   04/11/2021: Penny Berger presents today for MRI follow-up of the left elbow.  She continues to have persistent pain about the mid volar proximal left forearm.  This is tender with direct palpation.  She also is endorsing numbness in all the digits of the left hand.  This is waking her up at night and significantly painful.  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  Penny Berger is a 45 y.o. female with left shoulder pain over the front of the shoulder radiating toward the elbow.  She states that this happened 2-1/2 weeks ago.  She did not have any specific injury or incident.  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  She is right-hand dominant.  She has been taking Tylenol which does not help whatsoever.    Surgical History:   None  PMH/PSH/Family History/Social History/Meds/Allergies:    Past Medical History:  Diagnosis Date   Seizures ([place])    Stroke ([place])    Syncope    Past Surgical History:  Procedure Laterality Date   APPENDECTOMY     CESAREAN SECTION     Social History   Socioeconomic History   Marital status: Single    Spouse name: Not on file   Number of children: Not on file   Years of education: Not on file   Highest education level: Not on file  Occupational History   Not on file  Tobacco Use   Smoking status: Every Day    Packs/day: 0.50    Types: Cigarettes   Smokeless tobacco: Never  Substance and Sexual Activity   Alcohol use: Yes   Drug use: No   Sexual activity: Not on file  Other Topics Concern   Not on file  Social History Narrative   Not on file   Social Determinants of Health   Financial Resource Strain: Not on file  Food Insecurity: Not on file  Transportation Needs: Not on file  Physical Activity: Not on file  Stress: Not on file   Social Connections: Not on file   No family history on file. No Known Allergies Current Outpatient Medications  Medication Sig Dispense Refill   methylPREDNISolone (MEDROL DOSEPAK) 4 MG TBPK tablet Take per packet instructions 1 each 0   cephALEXin (KEFLEX) 500 MG capsule Take 1 capsule (500 mg total) by mouth 3 (three) times daily. 21 capsule 0   erythromycin ophthalmic ointment Place a 1/2 inch ribbon of ointment into the lower eyelid. 1 g 0   HYDROcodone-acetaminophen (NORCO/VICODIN) 5-325 MG tablet Take 1-2 tablets by mouth every 6 (six) hours as needed for moderate pain. 15 tablet 0   ibuprofen (ADVIL) 600 MG tablet Take 1 tablet (600 mg total) by mouth every 6 (six) hours as needed. 30 tablet 0   lidocaine (LIDODERM) 5 % Place 1 patch onto the skin daily. Remove & Discard patch within 12 hours or as directed by MD 5 patch 0   medroxyPROGESTERone (DEPO-PROVERA) 150 MG/ML injection Inject 150 mg into the muscle every 3 (three) months.     meloxicam (MOBIC) 15 MG tablet Take 1 tablet (15 mg total) by mouth daily. 30 tablet  2   ondansetron (ZOFRAN ODT) 8 MG disintegrating tablet Take 1 tablet (8 mg total) by mouth every 8 (eight) hours as needed for nausea or vomiting. 10 tablet 0   phenazopyridine (PYRIDIUM) 200 MG tablet Take 1 tablet (200 mg total) by mouth 3 (three) times daily. 6 tablet 0   promethazine (PHENERGAN) 25 MG tablet Take 1 tablet (25 mg total) by mouth every 6 (six) hours as needed for nausea or vomiting. (Patient not taking: Reported on 01/20/2018) 30 tablet 0   No current facility-administered medications for this visit.   No results found.  Review of Systems:   A ROS was performed including pertinent positives and negatives as documented in the HPI.  Physical Exam :   Constitutional: NAD and appears stated age Neurological: Alert and oriented Psych: Appropriate affect and cooperative There were no vitals taken for this visit.   Comprehensive Musculoskeletal Exam:     Musculoskeletal Exam    Inspection Right Left  Skin No atrophy or winging No atrophy or winging  Palpation    Tenderness None None  Range of Motion    Flexion (passive) 170 170  Flexion (active) 180 180  Extension 30 30  Abduction 170 170  ER at the side 70 70  ER at 90 of abduction 90 90  IR at 90 of abduction 60 60  Can reach behind back to T10 T10  Strength     Fall Full  Special Tests    Pseudoparalytic No No  Neurologic    Fires PIN, radial, median, ulnar, musculocutaneous, axillary, suprascapular, long thoracic, and spinal accessory innervated muscles. No abnormal sensibility  Vascular/Lymphatic    Radial Pulse 2+ 2+  Cervical Exam    Patient has symmetric cervical range of motion with negative Spurling's test.  Special Test: Tenderness to palpation about the mid volar forearm over the prior tendon tears   Full range of motion left elbow, -5-140 degrees, equal to contralateral side    Imaging:   Xray left elbow:  Normal   MRI left elbow There is fluid around the median nerve at the pronator teres site  I personally reviewed and interpreted the radiographs.   Assessment:   45 year old right-hand-dominant female with significant pain about the left proximal mid volar forearm.  She is now experiencing numbness in all digits of the hand.  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  I would like to prescribe her Medrol Dosepak to help with pain so that she may continue her job.  I have also advised that I would like to obtain an EMG/nerve conduction test in order to see if this is truly pronator teres syndrome with entrapment.  I will follow-up the results of this and make a necessary referral to Dr. Tempie Donning should this be the case   Plan :    -EMG/nerve conduction test ordered left arm to rule out pronator teres syndrome -Medrol Dosepak prescribed  I personally saw and evaluated the patient, and participated in the management and  treatment plan.  Vanetta Mulders, MD Attending Physician, Orthopedic Surgery  This document was dictated using Dragon voice recognition software. A reasonable attempt at proof reading has been made to minimize errors.

## 2021-04-18 ENCOUNTER — Ambulatory Visit (HOSPITAL_BASED_OUTPATIENT_CLINIC_OR_DEPARTMENT_OTHER): Payer: PRIVATE HEALTH INSURANCE | Admitting: Orthopaedic Surgery

## 2021-07-02 ENCOUNTER — Emergency Department (HOSPITAL_BASED_OUTPATIENT_CLINIC_OR_DEPARTMENT_OTHER)
Admission: EM | Admit: 2021-07-02 | Discharge: 2021-07-02 | Disposition: A | Discharge status: Home / Self Care | Payer: PRIVATE HEALTH INSURANCE | Attending: Emergency Medicine | Admitting: Emergency Medicine

## 2021-07-02 ENCOUNTER — Encounter (HOSPITAL_BASED_OUTPATIENT_CLINIC_OR_DEPARTMENT_OTHER): Payer: Self-pay

## 2021-07-02 ENCOUNTER — Other Ambulatory Visit: Payer: Self-pay

## 2021-07-02 ENCOUNTER — Emergency Department (HOSPITAL_BASED_OUTPATIENT_CLINIC_OR_DEPARTMENT_OTHER): Payer: PRIVATE HEALTH INSURANCE

## 2021-07-02 DIAGNOSIS — R109 Unspecified abdominal pain: Secondary | ICD-10-CM | POA: Diagnosis present

## 2021-07-02 DIAGNOSIS — D259 Leiomyoma of uterus, unspecified: Secondary | ICD-10-CM | POA: Insufficient documentation

## 2021-07-02 DIAGNOSIS — N200 Calculus of kidney: Secondary | ICD-10-CM | POA: Insufficient documentation

## 2021-07-02 DIAGNOSIS — R1032 Left lower quadrant pain: Secondary | ICD-10-CM

## 2021-07-02 LAB — URINALYSIS, ROUTINE W REFLEX MICROSCOPIC
Bilirubin Urine: NEGATIVE
Glucose, UA: NEGATIVE mg/dL
Hgb urine dipstick: NEGATIVE
Ketones, ur: NEGATIVE mg/dL
Nitrite: NEGATIVE
Specific Gravity, Urine: 1.031 — ABNORMAL HIGH (ref 1.005–1.030)
pH: 5.5 (ref 5.0–8.0)

## 2021-07-02 LAB — CBC
HCT: 41.7 % (ref 36.0–46.0)
Hemoglobin: 13.8 g/dL (ref 12.0–15.0)
MCH: 30.5 pg (ref 26.0–34.0)
MCHC: 33.1 g/dL (ref 30.0–36.0)
MCV: 92.3 fL (ref 80.0–100.0)
Platelets: 414 10*3/uL — ABNORMAL HIGH (ref 150–400)
RBC: 4.52 MIL/uL (ref 3.87–5.11)
RDW: 13.9 % (ref 11.5–15.5)
WBC: 6.2 10*3/uL (ref 4.0–10.5)
nRBC: 0 % (ref 0.0–0.2)

## 2021-07-02 LAB — BASIC METABOLIC PANEL
Anion gap: 10 (ref 5–15)
BUN: 13 mg/dL (ref 6–20)
CO2: 22 mmol/L (ref 22–32)
Calcium: 8.8 mg/dL — ABNORMAL LOW (ref 8.9–10.3)
Chloride: 106 mmol/L (ref 98–111)
Creatinine, Ser: 0.88 mg/dL (ref 0.44–1.00)
GFR, Estimated: >60 mL/min (ref >60)
Glucose, Bld: 101 mg/dL — ABNORMAL HIGH (ref 70–99)
Potassium: 3.8 mmol/L (ref 3.5–5.1)
Sodium: 138 mmol/L (ref 135–145)

## 2021-07-02 LAB — LIPASE, BLOOD: Lipase: 23 U/L (ref 11–51)

## 2021-07-02 LAB — PREGNANCY, URINE: Preg Test, Ur: NEGATIVE

## 2021-07-02 MED ORDER — MORPHINE SULFATE (PF) 4 MG/ML IV SOLN
4.0000 mg | Freq: Once | INTRAVENOUS | Status: AC
  Administered 2021-07-02: 4 mg via INTRAVENOUS
  Filled 2021-07-02: qty 1

## 2021-07-02 MED ORDER — SODIUM CHLORIDE 0.9 % IV SOLN: INTRAVENOUS | Status: DC

## 2021-07-02 MED ORDER — KETOROLAC TROMETHAMINE 15 MG/ML IJ SOLN
15.0000 mg | Freq: Once | INTRAMUSCULAR | Status: AC
  Administered 2021-07-02: 15 mg via INTRAVENOUS
  Filled 2021-07-02: qty 1

## 2021-07-02 MED ORDER — IOHEXOL 300 MG/ML  SOLN
100.0000 mL | Freq: Once | INTRAMUSCULAR | Status: AC | PRN
  Administered 2021-07-02: 100 mL via INTRAVENOUS

## 2021-07-02 MED ORDER — SODIUM CHLORIDE 0.9 % IV BOLUS
1000.0000 mL | Freq: Once | INTRAVENOUS | Status: AC
  Administered 2021-07-02: 1000 mL via INTRAVENOUS

## 2021-07-02 NOTE — ED Triage Notes (Signed)
Patient here POV from Home with Flank Pain.  Pain is Left Flank and has been present and worsening for 4 days. Patient  No N/V/D. No Constipation. No Known Fevers. No Urinary Symptoms.  NAD Noted during Triage. A&Ox4. GCS 15. Ambulatory.

## 2021-07-02 NOTE — Discharge Instructions (Addendum)
You were evaluated in the Emergency Department and after careful evaluation, we did not find any emergent condition requiring admission or further testing in the hospital.  Your exam/testing today was overall reassuring.  Your pelvic ultrasound revealed no evidence of ovarian torsion.  There was an incidental finding of a uterine fibroid noted posteriorly in her uterus.  You have a 2 mm kidney stone at the junction of your ureter and bladder which should pass spontaneously on its own.  Recommend continued hydration orally at home.  There is no evidence of urinary tract infection and there is no evidence of systemic infection at this time.  Please return to the Emergency Department if you experience any worsening of your condition.  Thank you for allowing [place] to be a part of your care.

## 2021-07-02 NOTE — ED Provider Notes (Signed)
[facility] EMERGENCY DEPT Provider Note   CSN: [id number] Arrival date & time: 07/02/21  1638     History  Chief Complaint  Patient presents with   Flank Pain    Penny Berger is a 46 y.o. female.   Flank Pain   46 year old female presenting to the emergency department the chief complaint of left flank pain.  The patient states that it has been intermittent but present for the last 4 days.  She does have a history of kidney stones but feels that this is different.  She denies any dysuria or hematuria or increased urinary frequency.  She denies any nausea, vomiting or diarrhea.  She denies any fevers or chills.  She denies any constipation.  She states that she is passing gas.  Denies any vaginal discharge or vaginal bleeding.  Home Medications Prior to Admission medications   Medication Sig Start Date End Date Taking? Authorizing Provider  cephALEXin (KEFLEX) 500 MG capsule Take 1 capsule (500 mg total) by mouth 3 (three) times daily. 08/31/19   Carlisle Cater, PA-C  erythromycin ophthalmic ointment Place a 1/2 inch ribbon of ointment into the lower eyelid. 12/25/19   Henderly, Britni A, PA-C  HYDROcodone-acetaminophen (NORCO/VICODIN) 5-325 MG tablet Take 1-2 tablets by mouth every 6 (six) hours as needed for moderate pain. 09/09/18   Lajean Saver, MD  ibuprofen (ADVIL) 600 MG tablet Take 1 tablet (600 mg total) by mouth every 6 (six) hours as needed. 02/16/78   Campbell Stall P, DO  lidocaine (LIDODERM) 5 % Place 1 patch onto the skin daily. Remove & Discard patch within 12 hours or as directed by MD 0/2/40   Campbell Stall P, DO  medroxyPROGESTERone (DEPO-PROVERA) 150 MG/ML injection Inject 150 mg into the muscle every 3 (three) months.    [provider]  meloxicam (MOBIC) 15 MG tablet Take 1 tablet (15 mg total) by mouth daily. 03/25/21   Vanetta Mulders, MD  methylPREDNISolone (MEDROL DOSEPAK) 4 MG TBPK tablet Take per packet instructions 04/11/21   Vanetta Mulders, MD  ondansetron (ZOFRAN ODT) 8 MG disintegrating tablet Take 1 tablet (8 mg total) by mouth every 8 (eight) hours as needed for nausea or vomiting. 09/09/18   Lajean Saver, MD  phenazopyridine (PYRIDIUM) 200 MG tablet Take 1 tablet (200 mg total) by mouth 3 (three) times daily. 08/31/19   Carlisle Cater, PA-C  promethazine (PHENERGAN) 25 MG tablet Take 1 tablet (25 mg total) by mouth every 6 (six) hours as needed for nausea or vomiting. Patient not taking: Reported on 01/20/2018 09/17/17   Okey Regal, PA-C      Allergies    Patient has no known allergies.    Review of Systems   Review of Systems  Genitourinary:  Positive for flank pain.  All other systems reviewed and are negative.  Physical Exam Updated Vital Signs BP (!) 128/99 (BP Location: Right Arm)    Pulse 92    Temp 97.8 F (36.6 C) (Oral)    Resp 15    Ht 5' 4.5" (1.638 m)    Wt 74.8 kg    SpO2 100%    BMI 27.87 kg/m  Physical Exam Vitals and nursing note reviewed.  Constitutional:      General: She is not in acute distress.    Appearance: She is well-developed.  HENT:     Head: Normocephalic and atraumatic.  Eyes:     Conjunctiva/sclera: Conjunctivae normal.  Cardiovascular:     Rate and Rhythm: Normal rate  and regular rhythm.     Heart sounds: No murmur heard. Pulmonary:     Effort: Pulmonary effort is normal. No respiratory distress.     Breath sounds: Normal breath sounds.  Abdominal:     Palpations: Abdomen is soft.     Tenderness: There is no abdominal tenderness.  Musculoskeletal:        General: No swelling.     Cervical back: Neck supple.  Skin:    General: Skin is warm and dry.     Capillary Refill: Capillary refill takes less than 2 seconds.  Neurological:     Mental Status: She is alert.  Psychiatric:        Mood and Affect: Mood normal.    ED Results / Procedures / Treatments   Labs (all labs ordered are listed, but only abnormal results are displayed) Labs Reviewed  URINALYSIS,  ROUTINE W REFLEX MICROSCOPIC - Abnormal; Notable for the following components:      Result Value   Specific Gravity, Urine 1.031 (*)    Protein, ur TRACE (*)    Leukocytes,Ua TRACE (*)    Bacteria, UA RARE (*)    All other components within normal limits  BASIC METABOLIC PANEL - Abnormal; Notable for the following components:   Glucose, Bld 101 (*)    Calcium 8.8 (*)    All other components within normal limits  CBC - Abnormal; Notable for the following components:   Platelets 414 (*)    All other components within normal limits  PREGNANCY, URINE  LIPASE, BLOOD    EKG None  Radiology CT ABDOMEN PELVIS W CONTRAST  Result Date: 07/02/2021 CLINICAL DATA:  Left lower quadrant abdominal pain. EXAM: CT ABDOMEN AND PELVIS WITH CONTRAST TECHNIQUE: Multidetector CT imaging of the abdomen and pelvis was performed using the standard protocol following bolus administration of intravenous contrast. RADIATION DOSE REDUCTION: This exam was performed according to the departmental dose-optimization program which includes automated exposure control, adjustment of the mA and/or kV according to patient size and/or use of iterative reconstruction technique. CONTRAST:  148mL OMNIPAQUE IOHEXOL 300 MG/ML  SOLN COMPARISON:  CT abdomen and pelvis 09/09/2018. FINDINGS: Lower chest: No acute abnormality. Hepatobiliary: No focal liver abnormality is seen. No gallstones, gallbladder wall thickening, or biliary dilatation. Pancreas: Unremarkable. No pancreatic ductal dilatation or surrounding inflammatory changes. Spleen: Normal in size without focal abnormality. Adrenals/Urinary Tract: There is a 2 mm calculus at the level of the left ureterovesicular junction best seen on coronal image 5/36. There is no hydronephrosis or perinephric fluid. The kidneys otherwise appear within normal limits. The adrenal glands and bladder are within normal limits. Stomach/Bowel: Stomach is within normal limits. No evidence of bowel wall  thickening, distention, or inflammatory changes. Appendix is likely surgically absent. Vascular/Lymphatic: Aortic atherosclerosis. No enlarged abdominal or pelvic lymph nodes. Reproductive: Uterus and bilateral adnexa are unremarkable. Other: No abdominal wall hernia or abnormality. No abdominopelvic ascites. Musculoskeletal: No acute or significant osseous findings. IMPRESSION: 1. 2 mm calculus at the left ureterovesicular junction. No hydronephrosis. Electronically Signed   By: Ronney Asters M.D.   On: 07/02/2021 19:29   US PELVIC COMPLETE W TRANSVAGINAL AND TORSION R/O  Result Date: 07/02/2021 CLINICAL DATA:  Left adnexal pain. History of C-section. Premenopausal. EXAM: TRANSABDOMINAL ULTRASOUND OF PELVIS DOPPLER ULTRASOUND OF OVARIES TECHNIQUE: Transabdominal ultrasound examination of the pelvis was performed including evaluation of the uterus, ovaries, adnexal regions, and pelvic cul-de-sac. Color and duplex Doppler ultrasound was utilized to evaluate blood flow to the ovaries.  COMPARISON:  None. FINDINGS: Uterus Measurements: 7.1 x 3.5 x 4.6 cm = volume: 58 mL. There is a 1.2 x 0.9 x 1.1 cm hypoechoic posterior uterine wall lesion likely representing a fibroid. Endometrium Thickness: 26mm.  No focal abnormality visualized. Right ovary Measurements: 3 x 2.1 x 2.1 cm = volume: 6.6 mL. Normal appearance/no adnexal mass. Left ovary Measurements: 2.6 x 2.1 x 1.7 cm = volume: 4.6 mL. Normal appearance/no adnexal mass. Pulsed Doppler evaluation demonstrates normal low-resistance arterial and venous waveforms in both ovaries. Other: No free fluid identified. IMPRESSION: 1. A 1.2 cm intramural fibroid. 2. Otherwise unremarkable pelvic ultrasound. Electronically Signed   By: Iven Finn M.D.   On: 07/02/2021 20:59    Procedures Procedures    Medications Ordered in ED Medications  sodium chloride 0.9 % bolus 1,000 mL (0 mLs Intravenous Stopped 07/02/21 1949)  morphine 4 MG/ML injection 4 mg (4 mg  Intravenous Given 07/02/21 1820)  iohexol (OMNIPAQUE) 300 MG/ML solution 100 mL (100 mLs Intravenous Contrast Given 07/02/21 1907)  ketorolac (TORADOL) 15 MG/ML injection 15 mg (15 mg Intravenous Given 07/02/21 1946)    ED Course/ Medical Decision Making/ A&P                           Medical Decision Making Amount and/or Complexity of Data Reviewed Labs: ordered. Radiology: ordered. ECG/medicine tests: ordered.  Risk Prescription drug management.   46 year old female presenting to the emergency department the chief complaint of left flank pain.  The patient states that it has been intermittent but present for the last 4 days.  She does have a history of kidney stones but feels that this is different.  She denies any dysuria or hematuria or increased urinary frequency.  She denies any nausea, vomiting or diarrhea.  She denies any fevers or chills.  She denies any constipation.  She states that she is passing gas.  Denies any vaginal discharge or vaginal bleeding.  On arrival, the patient was afebrile, tachycardic P1 20, hypertensive BP 170/112, saturating 90% on room air.  Sinus tachycardia noted on cardiac telemetry.  Physical exam generally unremarkable.  Patient presents with left flank pain that has been intermittent and sharp.  Differential diagnosis includes nephrolithiasis, pyelonephritis, diverticulitis, small bowel obstruction, ovarian torsion, ruptured ovarian cyst.  Laboratory work-up performed for further evaluation significant for normal and negative urine pregnancy, normal lipase, BMP unremarkable with no evidence of AKI, CBC without evidence of leukocytosis, urinalysis without hematuria or evidence of UTI.  Pelvic ultrasound was performed which revealed no evidence of ovarian  torsion.  A 1.2 cm intramural fibroid was noticed in the posterior aspect of the uterus.  Otherwise unremarkable pelvic ultrasound.  A CT abdomen pelvis without contrast was performed revealing a 2 mm calculus  at the left UV Vij[place] with no evidence of hydronephrosis.  The patient is not septic, has no evidence of obstruction with no hydronephrosis, no evidence of UTI with her nephrolithiasis.  She was administered Toradol for pain control and an IV fluid bolus.  She endorsed some improvement symptomatically.  The patient was advised of the findings of her work-up and advised to manage her symptoms with hydration, diet control measures, NSAIDs for pain control and follow-up with urology in clinic.  Overall stable for discharge.  Regarding her uterine fibroid, a referral to gynecology was also placed.  Final Clinical Impression(s) / ED Diagnoses Final diagnoses:  LLQ pain  Kidney stone  Uterine leiomyoma, unspecified location  Rx / DC Orders ED Discharge Orders          Ordered    Ambulatory referral to Urology        07/02/21 2103    Ambulatory referral to Gynecology        07/02/21 2107              Regan Lemming, MD 07/04/21 1630

## 2022-05-02 ENCOUNTER — Ambulatory Visit (HOSPITAL_COMMUNITY)
Admission: EM | Admit: 2022-05-02 | Discharge: 2022-05-03 | Disposition: A | Discharge status: Other Acute Inpatient Hospital | Payer: PRIVATE HEALTH INSURANCE | Attending: Family | Admitting: Family

## 2022-05-02 ENCOUNTER — Other Ambulatory Visit: Payer: Self-pay

## 2022-05-02 DIAGNOSIS — Z87440 Personal history of urinary (tract) infections: Secondary | ICD-10-CM | POA: Diagnosis not present

## 2022-05-02 DIAGNOSIS — Z1152 Encounter for screening for COVID-19: Secondary | ICD-10-CM | POA: Insufficient documentation

## 2022-05-02 DIAGNOSIS — R45851 Suicidal ideations: Secondary | ICD-10-CM | POA: Diagnosis present

## 2022-05-02 DIAGNOSIS — F332 Major depressive disorder, recurrent severe without psychotic features: Secondary | ICD-10-CM | POA: Diagnosis not present

## 2022-05-02 LAB — CBC WITH DIFFERENTIAL/PLATELET
Abs Immature Granulocytes: 0.02 10*3/uL (ref 0.00–0.07)
Basophils Absolute: 0 10*3/uL (ref 0.0–0.1)
Basophils Relative: 1 %
Eosinophils Absolute: 0 10*3/uL (ref 0.0–0.5)
Eosinophils Relative: 0 %
HCT: 45.2 % (ref 36.0–46.0)
Hemoglobin: 15 g/dL (ref 12.0–15.0)
Immature Granulocytes: 0 %
Lymphocytes Relative: 35 %
Lymphs Abs: 1.9 10*3/uL (ref 0.7–4.0)
MCH: 30.2 pg (ref 26.0–34.0)
MCHC: 33.2 g/dL (ref 30.0–36.0)
MCV: 90.9 fL (ref 80.0–100.0)
Monocytes Absolute: 0.4 10*3/uL (ref 0.1–1.0)
Monocytes Relative: 8 %
Neutro Abs: 3 10*3/uL (ref 1.7–7.7)
Neutrophils Relative %: 56 %
Platelets: 442 10*3/uL — ABNORMAL HIGH (ref 150–400)
RBC: 4.97 MIL/uL (ref 3.87–5.11)
RDW: 14.6 % (ref 11.5–15.5)
WBC: 5.4 10*3/uL (ref 4.0–10.5)
nRBC: 0 % (ref 0.0–0.2)

## 2022-05-02 LAB — POCT URINE DRUG SCREEN - MANUAL ENTRY (I-SCREEN)
POC Amphetamine UR: NOT DETECTED
POC Buprenorphine (BUP): NOT DETECTED
POC Cocaine UR: POSITIVE — AB
POC Marijuana UR: POSITIVE — AB
POC Methadone UR: NOT DETECTED
POC Methamphetamine UR: NOT DETECTED
POC Morphine: NOT DETECTED
POC Oxazepam (BZO): NOT DETECTED
POC Oxycodone UR: NOT DETECTED
POC Secobarbital (BAR): NOT DETECTED

## 2022-05-02 LAB — COMPREHENSIVE METABOLIC PANEL
ALT: 11 U/L (ref 0–44)
AST: 14 U/L — ABNORMAL LOW (ref 15–41)
Albumin: 3.9 g/dL (ref 3.5–5.0)
Alkaline Phosphatase: 56 U/L (ref 38–126)
Anion gap: 15 (ref 5–15)
BUN: 12 mg/dL (ref 6–20)
CO2: 19 mmol/L — ABNORMAL LOW (ref 22–32)
Calcium: 10 mg/dL (ref 8.9–10.3)
Chloride: 107 mmol/L (ref 98–111)
Creatinine, Ser: 0.88 mg/dL (ref 0.44–1.00)
GFR, Estimated: >60 mL/min (ref >60)
Glucose, Bld: 86 mg/dL (ref 70–99)
Potassium: 4.2 mmol/L (ref 3.5–5.1)
Sodium: 141 mmol/L (ref 135–145)
Total Bilirubin: 0.4 mg/dL (ref 0.3–1.2)
Total Protein: 7.1 g/dL (ref 6.5–8.1)

## 2022-05-02 LAB — LIPID PANEL
Cholesterol: 215 mg/dL — ABNORMAL HIGH (ref 0–200)
HDL: 43 mg/dL (ref >40)
LDL Cholesterol: 154 mg/dL — ABNORMAL HIGH (ref 0–99)
Total CHOL/HDL Ratio: 5 RATIO
Triglycerides: 90 mg/dL (ref <150)
VLDL: 18 mg/dL (ref 0–40)

## 2022-05-02 LAB — ETHANOL: Alcohol, Ethyl (B): <10 mg/dL (ref <10)

## 2022-05-02 LAB — POC URINE PREG, ED: Preg Test, Ur: NEGATIVE

## 2022-05-02 LAB — HEMOGLOBIN A1C
Hgb A1c MFr Bld: 5.7 % — ABNORMAL HIGH (ref 4.8–5.6)
Mean Plasma Glucose: 116.89 mg/dL

## 2022-05-02 LAB — TSH: TSH: 0.831 u[IU]/mL (ref 0.350–4.500)

## 2022-05-02 LAB — MAGNESIUM: Magnesium: 2.5 mg/dL — ABNORMAL HIGH (ref 1.7–2.4)

## 2022-05-02 MED ORDER — NITROFURANTOIN MONOHYD MACRO 100 MG PO CAPS
100.0000 mg | ORAL_CAPSULE | Freq: Two times a day (BID) | ORAL | Status: DC
  Administered 2022-05-02: 100 mg via ORAL
  Filled 2022-05-02: qty 1

## 2022-05-02 MED ORDER — MAGNESIUM HYDROXIDE 400 MG/5ML PO SUSP: 30.0000 mL | Freq: Every day | ORAL | Status: DC | PRN

## 2022-05-02 MED ORDER — HYDROXYZINE HCL 25 MG PO TABS
25.0000 mg | ORAL_TABLET | Freq: Three times a day (TID) | ORAL | Status: DC | PRN
  Filled 2022-05-02: qty 1

## 2022-05-02 MED ORDER — TRAZODONE HCL 50 MG PO TABS
50.0000 mg | ORAL_TABLET | Freq: Every evening | ORAL | Status: DC | PRN
  Administered 2022-05-02: 50 mg via ORAL
  Filled 2022-05-02: qty 1

## 2022-05-02 MED ORDER — NICOTINE 14 MG/24HR TD PT24
14.0000 mg | MEDICATED_PATCH | Freq: Every day | TRANSDERMAL | Status: DC
  Administered 2022-05-02: 14 mg via TRANSDERMAL
  Filled 2022-05-02: qty 1

## 2022-05-02 MED ORDER — ACETAMINOPHEN 325 MG PO TABS: 650.0000 mg | ORAL_TABLET | Freq: Four times a day (QID) | ORAL | Status: DC | PRN

## 2022-05-02 MED ORDER — ALUM & MAG HYDROXIDE-SIMETH 200-200-20 MG/5ML PO SUSP: 30.0000 mL | ORAL | Status: DC | PRN

## 2022-05-02 NOTE — ED Triage Notes (Signed)
Pt presents to [facility] accompanied by her mother and step-father due to SI with a plan to cut her wrists. Pt states she took a knife this morning and pressed it against her wrist and broke down crying and her mother called the crisis line. Pt states she stopped herself from cutting and she denies any past attempts. Pt states "I don't want to be here anymore" Pt reports feeling guilty because she lost custody of her children due to lack of housing. Pt reports living with her parents currently.Pt denies HI and AVH.

## 2022-05-02 NOTE — Progress Notes (Signed)
Inpatient Behavioral Health Placement  Pt meets inpatient criteria per Penny Stallion, FNP. There are no available beds at [facility].  Referral was sent to the following facilities;   Destination  Service Provider Address Phone  [facility]  [place], [place] [address] [place]  [address], [place] [address] [phone]  [facility]  [place], [place] [address] [phone]  [facility]  [address] [place] [address] [phone]  [facility]  [address] [place], [place] [address] [phone]  [place] [place] [address] [phone]  [facility]  [address] [place] [address] [phone]  [facility]  [address] [place] [address] [phone]  [place] [address], [place] [address] [phone]  [facility]  [address], [place] [address] [place]  [address], [place] [address] [facility]  [address] [place] [address] [phone]  [facility]  [address] [place] [address] [place]  [address] [place] [address] [phone]  [facility]  [address], [place] [address] [phone]  [facility]  [address], [place] [address] [phone]  [facility]  [address] [place], [place] [address] [phone]  [facility]  [address], [place] [address] [phone]  [place]  [place] [place] [address] [phone]    Situation ongoing,  CSW  will follow up.   Penny Berger, MSW, [facility] 05/02/2022  @ 4:09 PM

## 2022-05-02 NOTE — ED Notes (Signed)
Patient reports passive SI, denies HI and AVH - will continue to monitor for safety

## 2022-05-02 NOTE — Progress Notes (Signed)
Per [facility] AC Antionette Cillo, RN pt is under review for PENDING admission tomorrow. 2nd shift CSW will assist and follow to confirm.  Benjaman Kindler, MSW, [facility] 05/02/2022 3:42 PM

## 2022-05-02 NOTE — ED Notes (Signed)
Snack given.

## 2022-05-02 NOTE — BH Assessment (Signed)
Comprehensive Clinical Assessment (CCA) Note  05/02/2022 Penny Berger [id number] DISPOSITION: Penny Berger Resides NP recommends an inpatient admission. Patient is currently in continuous assessment awaiting placement.    [place] ED from 05/02/2022 in [facility] ED from 07/02/2021 in [facility] ED from 02/21/2021 in [facility]  C-SSRS RISK CATEGORY High Risk No Risk No Risk      The patient demonstrates the following risk factors for suicide: Chronic risk factors for suicide include: psychiatric disorder of depression . Acute risk factors for suicide include: social withdrawal/isolation. Protective factors for this patient include: coping skills. Considering these factors, the overall suicide risk at this point appears to be high. Patient is not appropriate for outpatient follow up.   Patient is a 46 year old female that presents this date voluntary as a walk in with ongoing passive S/I. Patient reports she is "so tired of living" and states earlier this date she attempted to self harm by holding a knife to her wrist but did not follow through. Patient denies any prior attempts or gestures at self harm. Patient denies any H/I or AVH. Patient denies any previous mental health diagnosis although states she feels she has been suffering from depression "for years" with current symptoms to include frequent episodes of crying, fatigue and feeling hopeless. Patient states she has felt "down for months" and has often though about ending her life on a daily bases. Patient denies any SA history or prior OP counseling or medication interventions to assist with symptom management. Patient reports multiple stressors to include: a "off and on relationship" of 12 years that she feels "is going nowhere," her current employment as a CNA in the field of healthcare which she reports "is so tiring" and not being able to raise her son due to  housing and "money issues". Patient is currently residing with her mother and stepfather. Patient denies any prior attempts or gestures at self harm and is requesting an inpatient admission this date to assist with stabilization.        Alen NP evaluated patient this date and writes: Patient presents voluntarily to [facility] behavioral health for walk-in assessment.  She is accompanied by her mother, Penny Berger.  Patient prefers that her mother remain present during assessment.  She is alert and oriented, pleasant and cooperative during assessment.  She presents with depressed mood, tearful affect.   Penny Berger endorses ongoing passive suicidal ideation for several months.  She states "I am tired of life, I tell God every night that if you do not wake me up in the morning it will be a blessing."  Patient reports feeling overwhelmed upon awakening today.  She held a knife to her wrist and contemplating completing suicide prior to arrival today.  She is insightful regarding diagnosis and agrees with plan for inpatient psychiatric admission.  She states "I know that I need help."   Chronic stressors include "I have guilt because I was not able to raise my son."  Patient's son is currently in the care of patient's brother and sister-in-law.  This is partially related to patient's housing situation.   She is not linked with outpatient psychiatry currently.  She does have an appointment on Monday with new individual counselor, Mrs. Penny Berger at confidential counseling.  No current medications.  She denies history of inpatient psychiatric hospitalization.  Family mental health history includes patient's sister who has been diagnosed with depression.   Karel resides in [place].  She denies access to weapons.  She is not employed in [employer].  She denies alcohol and substance use.  She endorses average sleep and appetite.    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 Self  What Is the Reason for Your Visit/Call Today? Pt presents to [facility] accompanied by her mother and step-father due to SI with a plan to cut her wrists. Pt states she took a knife this morning and pressed it against her wrist and broke down crying and her mother called the crisis line. Pt states she stopped herself from cutting and she denies any past attempts. Pt states "I don't want to be here anymore" Pt reports feeling guilty because she lost custody of her children due to lack of housing. Pt reports living with her Berger currently.Pt denies HI and AVH.  How Long Has This Been Causing You Problems? 1 wk - 1 month  What Do You Feel Would Help You the Most Today? Treatment for Depression or other mood problem   Have You Recently Had Any Thoughts About Hurting Yourself? Yes  Are You Planning to Commit Suicide/Harm Yourself At This time? No   Flowsheet Row ED from 05/02/2022 in [facility] ED from 07/02/2021 in [facility] ED from 02/21/2021 in [facility]  C-SSRS RISK CATEGORY High Risk No Risk No Risk       Have you Recently Had Thoughts About [place]? No  Are You Planning to Harm Someone at This Time? No  Explanation: NA   Have You Used Any Alcohol or Drugs in the Past 24 Hours? No  What Did You Use and How Much? NA   Do You Currently Have a Therapist/Psychiatrist? No  Name of Therapist/Psychiatrist: Name of Therapist/Psychiatrist: NA   Have You Been Recently Discharged From Any Office Practice or Programs? No  Explanation of Discharge From Practice/Program: NA     CCA Screening Triage Referral Assessment Type of Contact: Face-to-Face  Telemedicine Service Delivery:   Is this Initial or  Reassessment?   Date Telepsych consult ordered in [facility]:    Time Telepsych consult ordered in [facility]:    Location of Assessment: [facility] Assessment Services  Provider Location: [facility] Assessment Services   Collateral Involvement: None at this time   Does Patient Have a Stage manager Guardian? No  Legal Guardian Contact Information: NA  Copy of Legal Guardianship Form: -- (NA)  Legal Guardian Notified of Arrival: -- (NA)  Legal Guardian Notified of Pending Discharge: -- (NA)  If Minor and Not Living with Parent(s), Who has Custody? NA  Is CPS involved or ever been involved? Never  Is APS involved or ever been involved? Never   Patient Determined To Be At Risk for Harm To Self or Others Based on Review of Patient Reported Information or Presenting Complaint? Yes, for Self-Harm  Method: Plan with intent and identified person  Availability of Means: Has close by  Intent: Clearly intends on inflicting harm that could cause death  Notification Required: -- (NA)  Additional Information for Danger to Others Potential: -- (NA)  Additional Comments for Danger to Others Potential: NA  Are There Guns or Other Weapons in Your Home? No  Types of Guns/Weapons: NA  Are These Weapons  Safely Secured?                            -- (NA)  Who Could Verify You Are Able To Have These Secured: NA  Do You Have any Outstanding Charges, Pending Court Dates, Parole/Probation? No  Contacted To Inform of Risk of Harm To Self or Others: Other: Comment (NA)    Does Patient Present under Involuntary Commitment? No    [place] of Residence: [place]   Patient Currently Receiving the Following Services: Not Receiving Services   Determination of Need: Urgent (48 hours)   Options For Referral: Inpatient Hospitalization     CCA Biopsychosocial Patient Reported Schizophrenia/Schizoaffective Diagnosis in Past: No   Strengths: Patient is willing to participate in treatment and  interventions to maintain and adress her current mental health issues   Mental Health Symptoms Depression:   Change in energy/activity; Difficulty Concentrating; Hopelessness   Duration of Depressive symptoms:  Duration of Depressive Symptoms: Greater than two weeks   Mania:   None   Anxiety:    Difficulty concentrating; Fatigue   Psychosis:   None   Duration of Psychotic symptoms:    Trauma:   None   Obsessions:   None   Compulsions:   None   Inattention:   None   Hyperactivity/Impulsivity:   None   Oppositional/Defiant Behaviors:   None   Emotional Irregularity:   Chronic feelings of emptiness; Mood lability   Other Mood/Personality Symptoms:   NA    Mental Status Exam Appearance and self-care  Stature:   Average   Weight:   Average weight   Clothing:   Neat/clean   Grooming:   Normal   Cosmetic use:   None   Posture/gait:   Normal   Motor activity:   Not Remarkable   Sensorium  Attention:   Normal   Concentration:   Normal   Orientation:   X5   Recall/memory:   Normal   Affect and Mood  Affect:   Anxious   Mood:   Depressed; Anxious   Relating  Eye contact:   Normal   Facial expression:   Depressed   Attitude toward examiner:   Cooperative   Thought and Language  Speech flow:  Clear and Coherent   Thought content:   Appropriate to Mood and Circumstances   Preoccupation:   None   Hallucinations:   None   Organization:   Intact; Goal-directed   Transport planner of Knowledge:   Fair   Intelligence:   Average   Abstraction:   Normal   Judgement:   Good   Reality Testing:   Realistic   Insight:   Good   Decision Making:   Normal   Social Functioning  Social Maturity:   Responsible   Social Judgement:   Normal   Stress  Stressors:   Transitions; Grief/losses   Coping Ability:   Programme researcher, broadcasting/film/video Deficits:   None   Supports:   Usual      Religion: Religion/Spirituality Are You A Religious Person?: Yes What is Your Religious Affiliation?: Christian How Might This Affect Treatment?: Pt states she is strong in her faith  Leisure/Recreation: Leisure / Recreation Do You Have Hobbies?: No  Exercise/Diet: Exercise/Diet Do You Exercise?: No Have You Gained or Lost A Significant Amount of Weight in the Past Six Months?: No Do You Follow a Special Diet?: No Do You Have Any Trouble Sleeping?: No  CCA Employment/Education Employment/Work Situation: Employment / Work Situation Employment Situation: Employed Work Stressors: 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 Patient's Job has Been Impacted by Current Illness: No Has Patient ever Been in Passenger transport manager?: No  Education: Education Is Patient Currently Attending School?: No Last Grade Completed: 91 Did Pender?: No Did You Have An Individualized Education Program (IIEP): No Did You Have Any Difficulty At [employer]?: No Patient's Education Has Been Impacted by Current Illness: No   CCA Family/Childhood History Family and Relationship History: Family history Marital status: Long term relationship Long term relationship, how long?: 12 years What types of issues is patient dealing with in the relationship?: Pt states there are communication problems and the realtionship is "off and on" Additional relationship information: NA Does patient have children?: Yes How many children?: 1 How is patient's relationship with their children?: Pt states she has given up custody  Childhood History:  Childhood History By whom was/is the patient raised?: Both Berger Did patient suffer any verbal/emotional/physical/sexual abuse as a child?: No Did patient suffer from severe childhood neglect?: No Has patient ever been sexually abused/assaulted/raped as an adolescent or adult?: No Was the patient ever a victim of a crime or a  disaster?: No Witnessed domestic violence?: No Has patient been affected by domestic violence as an adult?: No       CCA Substance Use Alcohol/Drug Use: Alcohol / Drug Use Pain Medications: See MAR Prescriptions: See MAR Over the Counter: See MAR History of alcohol / drug use?: No history of alcohol / drug abuse                         ASAM's:  Six Dimensions of Multidimensional Assessment  Dimension 1:  Acute Intoxication and/or Withdrawal Potential:      Dimension 2:  Biomedical Conditions and Complications:      Dimension 3:  Emotional, Behavioral, or Cognitive Conditions and Complications:     Dimension 4:  Readiness to Change:     Dimension 5:  Relapse, Continued use, or Continued Problem Potential:     Dimension 6:  Recovery/Living Environment:     ASAM Severity Score:    ASAM Recommended Level of Treatment:     Substance use Disorder (SUD)    Recommendations for Services/Supports/Treatments:    Discharge Disposition:    DSM5 Diagnoses: There are no problems to display for this patient.    Referrals to Alternative Service(s): Referred to Alternative Service(s):   Place:   Date:   Time:    Referred to Alternative Service(s):   Place:   Date:   Time:    Referred to Alternative Service(s):   Place:   Date:   Time:    Referred to Alternative Service(s):   Place:   Date:   Time:     Mamie Nick, LCAS

## 2022-05-02 NOTE — ED Provider Notes (Signed)
[facility] Urgent Care Continuous Assessment Admission H&P  Date: 05/02/22 Patient Name: Penny Berger MRN: [id number] Chief Complaint:  Chief Complaint  Patient presents with   Suicidal      Diagnoses:  Final diagnoses:  MDD (major depressive disorder), recurrent severe, without psychosis ([place])    HPI: Patient presents voluntarily to [facility] behavioral health for walk-in assessment.  She is accompanied by her mother, Penny Berger.  Patient prefers that her mother remain present during assessment.  She is alert and oriented, pleasant and cooperative during assessment.  She presents with depressed mood, tearful affect.  Aissatou endorses ongoing passive suicidal ideation for several months.  Additional symptoms worsening x several months include ongoing passive suicidal thoughts, depressed mood, anhedonia and tearful episodes. She states "I am tired of life, I tell God every night that if you do not wake me up in the morning it will be a blessing."  Patient reports feeling overwhelmed upon awakening today.  She held a knife to her wrist and contemplating completing suicide prior to arrival today.  She is insightful regarding diagnosis and agrees with plan for inpatient psychiatric admission.  She states "I know that I need help."  Chronic stressors include "I have guilt because I was not able to raise my son."  Patient's son is currently in the care of patient's brother and sister-in-law.  This is partially related to patient's housing situation.  She is not linked with outpatient psychiatry currently.  She does have an appointment on Monday with new individual counselor, Mrs. Tye Savoy at confidential counseling.  No current medications.  She denies history of inpatient psychiatric hospitalization.  Family mental health history includes patient's sister who has been diagnosed with depression.  Penny Berger resides in [place] with her parents.  She denies access to weapons.  She is not employed in [employer].  She denies alcohol and substance use.  She endorses average sleep and appetite.  Patient offered support and encouragement. Reviewed treatment plan including inpatient psychiatric hospitalization. Discussed medications including hydroxyzine and trazodone, reviewed side effects, patient offered opportunity to ask questions. She has three remaining tablets of an antibiotic remaining for recent UTI.   PHQ 2-9:   [place] ED from 05/02/2022 in [facility] ED from 07/02/2021 in [facility] ED from 02/21/2021 in [facility]  C-SSRS RISK CATEGORY High Risk No Risk No Risk        Total Time spent with patient: 30 minutes  Musculoskeletal  Strength & Muscle Tone: within normal limits Gait & Station: normal Patient leans: N/A  Psychiatric Specialty Exam  Presentation General Appearance:  Appropriate for Environment; Casual  Eye Contact: Good  Speech: Clear and Coherent; Normal Rate  Speech Volume: Normal  Handedness: Right   Mood and Affect  Mood: Depressed  Affect: Depressed; Tearful   Thought Process  Thought Processes: Coherent; Goal Directed; Linear  Descriptions of Associations:Intact  Orientation:Full (Time, Place and Person)  Thought Content:Logical; WDL    Hallucinations:Hallucinations: None  Ideas of Reference:None  Suicidal Thoughts:Suicidal Thoughts: Yes, Active SI Active Intent and/or Plan: With Plan; With Intent  Homicidal Thoughts:Homicidal Thoughts: No   Sensorium  Memory: Immediate Good; Recent Good  Judgment: Fair  Insight: Fair   Community education officer  Concentration: Good  Attention Span: Good  Recall: Good  Fund of Knowledge: Good  Language: Good   Psychomotor Activity  Psychomotor Activity: Psychomotor Activity: Normal   Assets  Assets: Communication Skills; Desire for Improvement; Physical  Health;  Resilience; Social Support   Sleep  Sleep: Sleep: Fair   Nutritional Assessment (For OBS and FBC admissions only) Has the patient had a weight loss or gain of 10 pounds or more in the last 3 months?: No Has the patient had a decrease in food intake/or appetite?: No Does the patient have dental problems?: No Does the patient have eating habits or behaviors that may be indicators of an eating disorder including binging or inducing vomiting?: No Has the patient recently lost weight without trying?: 0 Has the patient been eating poorly because of a decreased appetite?: 0 Malnutrition Screening Tool Score: 0    Physical Exam Vitals and nursing note reviewed.  Constitutional:      Appearance: Normal appearance. She is well-developed and normal weight.  HENT:     Head: Normocephalic and atraumatic.     Nose: Nose normal.  Cardiovascular:     Rate and Rhythm: Normal rate.  Pulmonary:     Effort: Pulmonary effort is normal.  Musculoskeletal:        General: Normal range of motion.     Cervical back: Normal range of motion.  Skin:    General: Skin is warm and dry.  Neurological:     Mental Status: She is alert and oriented to person, place, and time.  Psychiatric:        Attention and Perception: Attention and perception normal.        Mood and Affect: Mood is depressed. Affect is tearful.        Speech: Speech normal.        Behavior: Behavior normal. Behavior is cooperative.        Thought Content: Thought content includes suicidal ideation. Thought content includes suicidal plan.        Cognition and Memory: Cognition and memory normal.    Review of Systems  Constitutional: Negative.   HENT: Negative.    Eyes: Negative.   Respiratory: Negative.    Cardiovascular: Negative.   Gastrointestinal: Negative.   Genitourinary: Negative.   Musculoskeletal: Negative.   Skin: Negative.   Neurological: Negative.   Psychiatric/Behavioral:  Positive for depression and suicidal  ideas.     Blood pressure (!) 160/96, pulse 100, temperature 99.7 F (37.6 C), temperature source Oral, resp. rate 18, SpO2 98 %. There is no height or weight on file to calculate BMI.  Past Psychiatric History: Major depressive disorder, panic attacks, suicidal ideation  Is the patient at risk to self? Yes  Has the patient been a risk to self in the past 6 months? No .    Has the patient been a risk to self within the distant past? Yes   Is the patient a risk to others? No   Has the patient been a risk to others in the past 6 months? No   Has the patient been a risk to others within the distant past? No   Past Medical History:  Past Medical History:  Diagnosis Date   Seizures ([place])    Stroke ([place])    Syncope     Past Surgical History:  Procedure Laterality Date   APPENDECTOMY     CESAREAN SECTION      Family History: No family history on file.  Social History:  Social History   Socioeconomic History   Marital status: Single    Spouse name: Not on file   Number of children: Not on file   Years of education: Not on file   Highest education  level: Not on file  Occupational History   Not on file  Tobacco Use   Smoking status: Every Day    Packs/day: 0.50    Types: Cigarettes   Smokeless tobacco: Never  Substance and Sexual Activity   Alcohol use: Yes   Drug use: No   Sexual activity: Not on file  Other Topics Concern   Not on file  Social History Narrative   Not on file   Social Determinants of Health   Financial Resource Strain: Not on file  Food Insecurity: Not on file  Transportation Needs: Not on file  Physical Activity: Not on file  Stress: Not on file  Social Connections: Not on file  Intimate Partner Violence: Not on file    SDOH:  SDOH Screenings   Tobacco Use: High Risk (07/02/2021)    Last Labs:  No visits with results within 6 Month(s) from this visit.  Latest known visit with results is:  Admission on 07/02/2021, Discharged on  07/02/2021  Component Date Value Ref Range Status   Color, Urine 07/02/2021 YELLOW  YELLOW Final   APPearance 07/02/2021 CLEAR  CLEAR Final   Specific Gravity, Urine 07/02/2021 1.031 (H)  1.005 - 1.030 Final   pH 07/02/2021 5.5  5.0 - 8.0 Final   Glucose, UA 07/02/2021 NEGATIVE  NEGATIVE mg/dL Final   Hgb urine dipstick 07/02/2021 NEGATIVE  NEGATIVE Final   Bilirubin Urine 07/02/2021 NEGATIVE  NEGATIVE Final   Ketones, ur 07/02/2021 NEGATIVE  NEGATIVE mg/dL Final   Protein, ur 07/02/2021 TRACE (A)  NEGATIVE mg/dL Final   Nitrite 07/02/2021 NEGATIVE  NEGATIVE Final   Leukocytes,Ua 07/02/2021 TRACE (A)  NEGATIVE Final   RBC / HPF 07/02/2021 0-5  0 - 5 RBC/hpf Final   WBC, UA 07/02/2021 0-5  0 - 5 WBC/hpf Final   Bacteria, UA 07/02/2021 RARE (A)  NONE SEEN Final   Squamous Epithelial / LPF 07/02/2021 0-5  0 - 5 Final   Mucus 07/02/2021 PRESENT   Final   Performed at [facility], [address] [place], [place] [address]   Preg Test, Ur 07/02/2021 NEGATIVE  NEGATIVE Final   Comment:        THE SENSITIVITY OF THIS METHODOLOGY IS >20 mIU/mL. Performed at [facility], [address] [place], [place] [address]    Sodium 07/02/2021 138  135 - 145 mmol/L Final   Potassium 07/02/2021 3.8  3.5 - 5.1 mmol/L Final   Chloride 07/02/2021 106  98 - 111 mmol/L Final   CO2 07/02/2021 22  22 - 32 mmol/L Final   Glucose, Bld 07/02/2021 101 (H)  70 - 99 mg/dL Final   Glucose reference range applies only to samples taken after fasting for at least 8 hours.   BUN 07/02/2021 13  6 - 20 mg/dL Final   Creatinine, Ser 07/02/2021 0.88  0.44 - 1.00 mg/dL Final   Calcium 07/02/2021 8.8 (L)  8.9 - 10.3 mg/dL Final   GFR, Estimated 07/02/2021 >60  >60 mL/min Final   Comment: (NOTE) Calculated using the CKD-EPI Creatinine Equation (2021)    Anion gap 07/02/2021 10  5 - 15 Final   Performed at [facility], [place], [place], [place] [address]    WBC 07/02/2021 6.2  4.0 - 10.5 K/uL Final   RBC 07/02/2021 4.52  3.87 - 5.11 MIL/uL Final   Hemoglobin 07/02/2021 13.8  12.0 - 15.0 g/dL Final   HCT 07/02/2021 41.7  36.0 - 46.0 % Final  MCV 07/02/2021 92.3  80.0 - 100.0 fL Final   MCH 07/02/2021 30.5  26.0 - 34.0 pg Final   MCHC 07/02/2021 33.1  30.0 - 36.0 g/dL Final   RDW 07/02/2021 13.9  11.5 - 15.5 % Final   Platelets 07/02/2021 414 (H)  150 - 400 K/uL Final   nRBC 07/02/2021 0.0  0.0 - 0.2 % Final   Performed at [facility], [address], [place], [place] [address]   Lipase 07/02/2021 23  11 - 51 U/L Final   Performed at [facility], [address], [place], [place] [address]    Allergies: Patient has no known allergies.  PTA Medications: (Not in a hospital admission)   Medical Decision Making  Patient remains voluntary.  She will be placed in observation area while awaiting inpatient psychiatric treatment/admission.  Laboratory studies ordered including CBC, CMP, ethanol, A1c,  lipid panel, magnesium, prolactin and TSH.  Urine pregnancy, urine drug screen ordered.  EKG order initiated.  Labs reviewed UDS +THC, +cocaine UPS negative Respiratory Panel-influenza A and B negative, COVID negative   Current medications: -Acetaminophen 650 mg every 6 as needed/mild pain -Maalox 30 mL oral every 4 as needed/digestion -Hydroxyzine 25 mg 3 times daily as needed/anxiety -Magnesium hydroxide 30 mL daily as needed/mild constipation -Trazodone 50 mg nightly as needed/sleep -Nitrofurantoin (Macrobid) 100 mg every 12 hours x3 doses     Recommendations  Based on my evaluation the patient does not appear to have an emergency medical condition.  Lucky Rathke, FNP 05/02/22  3:10 PM

## 2022-05-03 ENCOUNTER — Encounter (HOSPITAL_COMMUNITY): Payer: Self-pay | Admitting: Nurse Practitioner

## 2022-05-03 ENCOUNTER — Inpatient Hospital Stay (HOSPITAL_COMMUNITY)
Admission: AD | Admit: 2022-05-03 | Discharge: 2022-05-08 | DRG: 885 | Disposition: A | Discharge status: Home / Self Care | Payer: PRIVATE HEALTH INSURANCE | Source: Intra-hospital | Attending: Psychiatry | Admitting: Psychiatry

## 2022-05-03 DIAGNOSIS — K5909 Other constipation: Secondary | ICD-10-CM | POA: Diagnosis present

## 2022-05-03 DIAGNOSIS — Z79899 Other long term (current) drug therapy: Secondary | ICD-10-CM | POA: Diagnosis not present

## 2022-05-03 DIAGNOSIS — F1020 Alcohol dependence, uncomplicated: Secondary | ICD-10-CM | POA: Diagnosis present

## 2022-05-03 DIAGNOSIS — F1210 Cannabis abuse, uncomplicated: Secondary | ICD-10-CM | POA: Diagnosis present

## 2022-05-03 DIAGNOSIS — F1510 Other stimulant abuse, uncomplicated: Secondary | ICD-10-CM | POA: Diagnosis present

## 2022-05-03 DIAGNOSIS — Z818 Family history of other mental and behavioral disorders: Secondary | ICD-10-CM | POA: Diagnosis not present

## 2022-05-03 DIAGNOSIS — F17210 Nicotine dependence, cigarettes, uncomplicated: Secondary | ICD-10-CM | POA: Diagnosis present

## 2022-05-03 DIAGNOSIS — F411 Generalized anxiety disorder: Secondary | ICD-10-CM | POA: Diagnosis present

## 2022-05-03 DIAGNOSIS — R45851 Suicidal ideations: Secondary | ICD-10-CM | POA: Diagnosis present

## 2022-05-03 DIAGNOSIS — K5900 Constipation, unspecified: Secondary | ICD-10-CM | POA: Diagnosis present

## 2022-05-03 DIAGNOSIS — F1290 Cannabis use, unspecified, uncomplicated: Secondary | ICD-10-CM | POA: Diagnosis present

## 2022-05-03 DIAGNOSIS — Z8673 Personal history of transient ischemic attack (TIA), and cerebral infarction without residual deficits: Secondary | ICD-10-CM

## 2022-05-03 DIAGNOSIS — F1410 Cocaine abuse, uncomplicated: Secondary | ICD-10-CM | POA: Diagnosis present

## 2022-05-03 DIAGNOSIS — G4710 Hypersomnia, unspecified: Secondary | ICD-10-CM | POA: Diagnosis present

## 2022-05-03 DIAGNOSIS — E7800 Pure hypercholesterolemia, unspecified: Secondary | ICD-10-CM | POA: Diagnosis present

## 2022-05-03 DIAGNOSIS — N390 Urinary tract infection, site not specified: Secondary | ICD-10-CM | POA: Diagnosis present

## 2022-05-03 DIAGNOSIS — R7303 Prediabetes: Secondary | ICD-10-CM | POA: Diagnosis present

## 2022-05-03 DIAGNOSIS — T1491XA Suicide attempt, initial encounter: Secondary | ICD-10-CM | POA: Diagnosis present

## 2022-05-03 DIAGNOSIS — F332 Major depressive disorder, recurrent severe without psychotic features: Secondary | ICD-10-CM | POA: Diagnosis present

## 2022-05-03 LAB — RESP PANEL BY RT-PCR (FLU A&B, COVID) ARPGX2
Influenza A by PCR: NEGATIVE
Influenza B by PCR: NEGATIVE
SARS Coronavirus 2 by RT PCR: NEGATIVE

## 2022-05-03 MED ORDER — NICOTINE 14 MG/24HR TD PT24
14.0000 mg | MEDICATED_PATCH | Freq: Every day | TRANSDERMAL | Status: DC
  Administered 2022-05-04 – 2022-05-08 (×5): 14 mg via TRANSDERMAL
  Filled 2022-05-03 (×9): qty 1

## 2022-05-03 MED ORDER — ALUM & MAG HYDROXIDE-SIMETH 200-200-20 MG/5ML PO SUSP: 30.0000 mL | ORAL | Status: DC | PRN

## 2022-05-03 MED ORDER — MAGNESIUM HYDROXIDE 400 MG/5ML PO SUSP: 30.0000 mL | Freq: Every day | ORAL | Status: DC | PRN

## 2022-05-03 MED ORDER — VENLAFAXINE HCL ER 75 MG PO CP24
75.0000 mg | ORAL_CAPSULE | Freq: Every day | ORAL | Status: DC
  Administered 2022-05-03 – 2022-05-05 (×3): 75 mg via ORAL
  Filled 2022-05-03 (×5): qty 1

## 2022-05-03 MED ORDER — LITHIUM CARBONATE ER 300 MG PO TBCR
300.0000 mg | EXTENDED_RELEASE_TABLET | Freq: Two times a day (BID) | ORAL | Status: DC
  Administered 2022-05-03 – 2022-05-04 (×3): 300 mg via ORAL
  Filled 2022-05-03 (×8): qty 1

## 2022-05-03 MED ORDER — TRAZODONE HCL 50 MG PO TABS
50.0000 mg | ORAL_TABLET | Freq: Every evening | ORAL | Status: DC | PRN
  Administered 2022-05-03 – 2022-05-07 (×5): 50 mg via ORAL
  Filled 2022-05-03 (×6): qty 1
  Filled 2022-05-03: qty 7

## 2022-05-03 MED ORDER — ACETAMINOPHEN 325 MG PO TABS: 650.0000 mg | ORAL_TABLET | Freq: Four times a day (QID) | ORAL | Status: DC | PRN

## 2022-05-03 MED ORDER — HYDROXYZINE HCL 25 MG PO TABS
25.0000 mg | ORAL_TABLET | Freq: Three times a day (TID) | ORAL | Status: DC | PRN
  Administered 2022-05-03 – 2022-05-07 (×5): 25 mg via ORAL
  Filled 2022-05-03: qty 10
  Filled 2022-05-03 (×5): qty 1

## 2022-05-03 MED ORDER — NITROFURANTOIN MONOHYD MACRO 100 MG PO CAPS
100.0000 mg | ORAL_CAPSULE | Freq: Two times a day (BID) | ORAL | Status: AC
  Administered 2022-05-03 – 2022-05-04 (×4): 100 mg via ORAL
  Filled 2022-05-03 (×5): qty 1

## 2022-05-03 MED ORDER — POLYETHYLENE GLYCOL 3350 17 G PO PACK
17.0000 g | PACK | Freq: Every day | ORAL | Status: DC | PRN
  Administered 2022-05-05 – 2022-05-06 (×2): 17 g via ORAL
  Filled 2022-05-03: qty 3
  Filled 2022-05-03 (×2): qty 1

## 2022-05-03 NOTE — Tx Team (Signed)
Initial Treatment Plan 05/03/2022 4:42 AM Penny Berger [id number]:[id number]    PATIENT STRESSORS: Financial difficulties   Marital or family conflict   Occupational concerns   Substance abuse     PATIENT STRENGTHS: Physical Health  Supportive family/friends  Work skills    PATIENT IDENTIFIED PROBLEMS: Depression  Anxiety  "Coping"                 DISCHARGE CRITERIA:  Ability to meet basic life and health needs Adequate post-discharge living arrangements Improved stabilization in mood, thinking, and/or behavior Medical problems require only outpatient monitoring Verbal commitment to aftercare and medication compliance  PRELIMINARY DISCHARGE PLAN: Attend aftercare/continuing care group Attend PHP/IOP Outpatient therapy Placement in alternative living arrangements Return to previous living arrangement Return to previous work or school arrangements  PATIENT/FAMILY INVOLVEMENT: This treatment plan has been presented to and reviewed with the patient, Penny Berger, and/or family member.  The patient and family have been given the opportunity to ask questions and make suggestions.  Wolfgang Phoenix, RN 05/03/2022, 4:42 AM

## 2022-05-03 NOTE — B[facility] Group Notes (Signed)
Psychoeducational Group Note  Date: 04/02/22 Time: 0900-1000    Goal Setting   Purpose of Group: This group helps to provide patients with the steps of setting a goal that is specific, measurable, attainable, realistic and time specific. A discussion on how we keep ourselves stuck with negative self talk. Homework given for Patients to write 30 positive attributes about themselves.    Participation Level:  Active  Participation Quality:  Appropriate  Affect:  Appropriate  Cognitive:  Appropriate  Insight:  Improving  Engagement in Group:  Engaged  Additional Comments:  Would not give a level of how she is feeling. She admitted to thoughts of suicide and just wanting to be here. Talked about the level of pain she is in.  [facility], Altha Harm A

## 2022-05-03 NOTE — ED Notes (Signed)
Report called to RN at [facility]

## 2022-05-03 NOTE — B[facility] Group Notes (Signed)
Adult Psychoeducational Group Note  Date:  05/03/2022 Time:  9:29 AM  Group Topic/Focus:  Goals Group:   The focus of this group is to help patients establish daily goals to achieve during treatment and discuss how the patient can incorporate goal setting into their daily lives to aide in recovery.  Participation Level:  Active  Participation Quality:  Appropriate  Affect:  Appropriate  Cognitive:  Appropriate  Insight: Appropriate  Engagement in Group:  Engaged  Modes of Intervention:  Discussion  Additional Comments:    Dub Mikes 05/03/2022, 9:29 AM

## 2022-05-03 NOTE — ED Notes (Signed)
Patient sleeping - no sxs of distress noted - will continue to monitor for safety

## 2022-05-03 NOTE — BHH Suicide Risk Assessment (Signed)
Suicide Risk Assessment  Admission Assessment    [facility] Admission Suicide Risk Assessment   Nursing information obtained from:  Patient Demographic factors:  Low socioeconomic status Current Mental Status:  NA Loss Factors:  Loss of significant relationship, Financial problems / change in socioeconomic status Historical Factors:  Impulsivity, Family history of mental illness or substance abuse Risk Reduction Factors:  Living with another person, especially a relative, Employed, Responsible for children under 75 years of age  Total Time spent with patient: 45 minutes Principal Problem: MDD (major depressive disorder), recurrent severe, without psychosis ([place]) Diagnosis:  Principal Problem:   MDD (major depressive disorder), recurrent severe, without psychosis ([place]) Active Problems:   Suicidal behavior with attempted self-injury ([place])   Cocaine abuse, episodic use ([place])   Alcohol use disorder, moderate, dependence ([place])   Marijuana smoker, continuous  Subjective Data: Penny Berger is a 46 year old female with a PPH of MDD- recurrent/severe/without psychotic features, post-partum depression, GAD, PNES, and chronic suicidality who presented to the [facility] voluntarily from [facility] for [place] with a plan to cut her wrist.    On Chart Review: Patient previously treated for anxiety at [facility] by PCP Suzanna Obey, MD  Continued Clinical Symptoms:  Alcohol Use Disorder Identification Test Final Score (AUDIT): 9 The "Alcohol Use Disorders Identification Test", Guidelines for Use in Primary Care, Second Edition.  World Pharmacologist [facility]). Score between 0-7:  no or low risk or alcohol related problems. Score between 8-15:  moderate risk of alcohol related problems. Score between 16-19:  high risk of alcohol related problems. Score 20 or above:  warrants further diagnostic evaluation for alcohol dependence and treatment.   CLINICAL FACTORS:   Depression:   Anhedonia Comorbid alcohol  abuse/dependence Hopelessness Impulsivity Severe Dysthymia Alcohol/Substance Abuse/Dependencies   Musculoskeletal: Strength & Muscle Tone: within normal limits Gait & Station: normal Patient leans: N/A  Psychiatric Specialty Exam:  Presentation  General Appearance:  Appropriate for Environment; Casual; Fairly Groomed  Eye Contact: Good  Speech: Clear and Coherent; Normal Rate  Speech Volume: Normal  Handedness: Right   Mood and Affect  Mood: Depressed; Hopeless; Worthless  Affect: Congruent; Depressed   Thought Process  Thought Processes: Coherent; Goal Directed  Descriptions of Associations:Intact  Orientation:Full (Time, Place and Person)  Thought Content:WDL  History of Schizophrenia/Schizoaffective disorder:No  Duration of Psychotic Symptoms:No data recorded Hallucinations:Hallucinations: None  Ideas of Reference:None  Suicidal Thoughts:Suicidal Thoughts: Yes, Passive SI Active Intent and/or Plan: With Plan; With Intent SI Passive Intent and/or Plan: With Intent; Without Plan  Homicidal Thoughts:Homicidal Thoughts: No   Sensorium  Memory: Immediate Good; Recent Fair; Remote Poor  Judgment: Poor  Insight: Fair; Shallow   Executive Functions  Concentration: Good  Attention Span: Good  Recall: Good  Fund of Knowledge: Good  Language: Good   Psychomotor Activity  Psychomotor Activity: Psychomotor Activity: Normal   Assets  Assets: Communication Skills; Desire for Improvement; Housing; Leisure Time; Physical Health; Resilience; Social Support; Vocational/Educational   Sleep  Sleep: Sleep: Good    Physical Exam: Physical Exam Vitals reviewed.  Constitutional:      General: She is not in acute distress.    Appearance: She is not toxic-appearing.  HENT:     Head: Normocephalic and atraumatic.     Mouth/Throat:     Mouth: Mucous membranes are moist.     Pharynx: Oropharynx is clear.  Pulmonary:      Effort: Pulmonary effort is normal.  Skin:    General: Skin is warm and  dry.  Neurological:     General: No focal deficit present.     Mental Status: She is alert. Mental status is at baseline.     Motor: No weakness.     Gait: Gait normal.      Review of Systems  Constitutional:  Negative for malaise/fatigue.  Cardiovascular:  Negative for chest pain and palpitations.  Gastrointestinal:  Positive for constipation.       Chronic  Genitourinary: Negative.        Near resolution of all UTI sx  Neurological:  Negative for dizziness, tremors, seizures and headaches.    Blood pressure 116/76, pulse 86, temperature 98.9 F (37.2 C), temperature source Oral, resp. rate 16, height 5' 4.5" (1.638 m), weight 74.8 kg, SpO2 99 %. Body mass index is 27.87 kg/m.   COGNITIVE FEATURES THAT CONTRIBUTE TO RISK:  Polarized thinking and Thought constriction (tunnel vision)    SUICIDE RISK:   Extreme:  Frequent, intense, and enduring suicidal ideation, specific plans, clear subjective and objective intent, impaired self-control, severe dysphoria/symptomatology, many risk factors and no protective factors.  PLAN OF CARE:   Full plan of care per H&P  I certify that inpatient services furnished can reasonably be expected to improve the patient's condition.   Rosezetta Schlatter, MD 05/03/2022, 4:11 PM

## 2022-05-03 NOTE — Progress Notes (Signed)
Adult Psychoeducational Group Note  Date:  05/03/2022 Time:  10:33 PM  Group Topic/Focus:  Wrap-Up Group:   The focus of this group is to help patients review their daily goal of treatment and discuss progress on daily workbooks.  Participation Level:  Active  Participation Quality:  Appropriate  Affect:  Appropriate  Cognitive:  Appropriate  Insight: Appropriate  Engagement in Group:  Engaged  Modes of Intervention:  Discussion, Rapport Building, Socialization, and Support  Additional Comments:   Pt attended and participated in the [place] group. Pt rated her day a 2/10 then changed it to a 0/10. Pt shared that her conversation with her family triggered her. Pt's reports that today confirmed that she has not made any progress toward her goal of managing her anger and not letting her family get to her. Pt identified sitting alone, and not being around people that trigger her as helpful coping strategies.   Wetzel Bjornstad Authur Cubit 05/03/2022, 10:33 PM

## 2022-05-03 NOTE — ED Notes (Signed)
Safe Transport to get patient - patient walked out to vehicle in no sxs of distress with paper work

## 2022-05-03 NOTE — BHH Counselor (Signed)
Clinical Social Work Note  CSW attempted to do Psychosocial Assessment with patient, but she asked that it be postponed to next day.  CSW team will continue efforts.  Selmer Dominion, LCSW 05/03/2022, 5:00 PM

## 2022-05-03 NOTE — Group Note (Signed)
LCSW Group Therapy Note  05/03/2022      Type of Therapy and Topic:  Group Therapy: Gratitude   Description:   Group could not be held by social worker, but licensed RN did provide group.  A handout was given to each patient, with the following information:   Gratitude  "Acknowledging the good that you already have in your life is the foundation for all abundance." - Eckhart Tolle  " 'Enough' is a feast." - Buddhist Proverb  "Gratitude sweetens even the smallest moments."  "It is not joy that makes us grateful; It is gratitude that makes us joyful." - David Steindl-Rast    Put at least one response under each category of something for which you are grateful:  People:  Experiences:  Things:  Places:  Skills:  Other:  Add more responses as you get ideas from other people.   Therapeutic Modalities:   Activity  Leiliana Foody J Grossman-Orr, LCSW .  

## 2022-05-03 NOTE — Progress Notes (Signed)
Penny Berger is a 46 y.o. female voluntarily admitted for  suicide ideation with plan to slit her wrist with a knife. Pt stated she has been overwhelmed with life, she lost custody of his 79 yr old son who is residing with pt bother and sister in law. Pt currently does not have a place  of her own. Live with her mother, work as Quarry manager in the healthcare and states that its difficult. Pt has been cooperative with admission. Denies SI/HI contracted for safety. Consents signed, skin/belongings search completed and pt oriented to unit. Pt stable at this time. Pt given the opportunity to express concerns and ask questions. Pt given toiletries. Will continue to monitor.

## 2022-05-03 NOTE — Progress Notes (Signed)
Dar Note: Patient presents with a flat affect and depressed mood.  Denies suicidal thoughts, auditory and visual hallucinations.  Medication given as prescribed.  Routine safety checks maintained.  Patient offered support and encouragement as needed.  Patient visible in milieu with minimal interaction.  Patient is safe on and off the unit.

## 2022-05-03 NOTE — Progress Notes (Signed)
Safety round complete. Patient located in bedroom, eyes closed, lying supine position. Q15 mins checks will be continued.   

## 2022-05-03 NOTE — B[facility] Group Notes (Signed)
Psychoeducational Group Note    Date:  1118/2023 Time: 1300-1400    Purpose of Group: . The group focus' on teaching patients on how to identify their needs and their Life Skills:  A group where two lists are made. What people need and what are things that we do that are unhealthy. The lists are developed by the patients and it is explained that we often do the actions that are not healthy to get our list of needs met.  Goal:: to develop the coping skills needed to get their needs met  Participation Level: did not attend  Paulino Rily
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
## 2022-05-04 DIAGNOSIS — F332 Major depressive disorder, recurrent severe without psychotic features: Secondary | ICD-10-CM

## 2022-05-04 LAB — PROLACTIN: Prolactin: 5 ng/mL (ref 4.8–23.3)

## 2022-05-04 MED ORDER — ENSURE ENLIVE PO LIQD
237.0000 mL | Freq: Two times a day (BID) | ORAL | Status: DC
  Administered 2022-05-04: 237 mL via ORAL
  Filled 2022-05-04 (×5): qty 237

## 2022-05-04 NOTE — Progress Notes (Signed)
   05/03/22 2200  Psych Admission Type (Psych Patients Only)  Admission Status Voluntary  Psychosocial Assessment  Patient Complaints Depression  Eye Contact Brief  Facial Expression Animated  Affect Appropriate to circumstance  Speech Logical/coherent  Interaction Assertive  Motor Activity Slow  Appearance/Hygiene Unremarkable  Behavior Characteristics Appropriate to situation;Cooperative  Mood Depressed  Thought Process  Coherency WDL  Content WDL  Delusions None reported or observed  Perception WDL  Hallucination None reported or observed  Judgment Poor  Confusion None  Danger to Self  Current suicidal ideation? Denies  Self-Injurious Behavior No self-injurious ideation or behavior indicators observed or expressed   Agreement Not to Harm Self Yes  Description of Agreement verbal  Danger to Others  Danger to Others None reported or observed

## 2022-05-04 NOTE — BHH Suicide Risk Assessment (Signed)
[place] INPATIENT:  Family/Significant Other Suicide Prevention Education  Suicide Prevention Education:  Education Completed; mother Mattie Marlin, cell [phone],  (name of family member/significant other) has been identified by the patient as the family member/significant other with whom the patient will be residing, and identified as the person(s) who will aid the patient in the event of a mental health crisis (suicidal ideations/suicide attempt).    Mother states there are no guns in the home.  She reports that the patient has been "in this mode" since she became an adult.  She has never been diagnosed with Bipolar disorder, but "you never know what you are going to get when she wakes up."  She has never remained consistent with therapy in the past, has always said she does not need help.  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  She did not feel love toward her son, so he was given to her brother and sister-in-law.  She constantly feels guilty about him.  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  Those seizures have stopped although at times she still feels she is going to have a seizure.  Now that her sister (who is a Surveyor, minerals) has moved out of the house, patient wants to move out but does not have the money to do so, which she does not understand despite parents working out a budget for her.  Mother states the patient does not live in reality, does not understand reality.  With written consent from the patient, the family member/significant other has been provided the following suicide prevention education, prior to the and/or following the discharge of the patient.  The suicide prevention education  provided includes the following: Suicide risk factors Suicide prevention and interventions National Suicide Hotline telephone number [facility] assessment telephone number [facility] Emergency Assistance [place] and/or Residential Mobile Crisis Unit telephone number  Request made of family/significant other to: Remove weapons (e.g., guns, rifles, knives), all items previously/currently identified as safety concern.   Remove drugs/medications (over-the-counter, prescriptions, illicit drugs), all items previously/currently identified as a safety concern.  The family member/significant other verbalizes understanding of the suicide prevention education information provided.  The family member/significant other agrees to remove the items of safety concern listed above.  Berlin Hun Grossman-Orr 05/04/2022, 10:20 AM

## 2022-05-04 NOTE — Progress Notes (Signed)
Adult Psychoeducational Group Note  Date:  05/04/2022 Time:  9:45 PM  Group Topic/Focus:  Wrap-Up Group:   The focus of this group is to help patients review their daily goal of treatment and discuss progress on daily workbooks.  Participation Level:  Minimal  Participation Quality:  Resistant  Affect:  Flat  Cognitive:  Appropriate  Insight: Limited  Engagement in Group:  Engaged  Modes of Intervention:  Discussion  Additional Comments:   Pt stated "groups make me anxious" and "I don't have anything to say. I don't want to talk." Pt endorsed feelings of anxiety and depression.   Gerhard Perches 05/04/2022, 9:45 PM
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
## 2022-05-04 NOTE — Progress Notes (Signed)
[facility] MD Progress Note  05/04/2022 8:40 AM Penny Berger  MRN:  [id number] Principal Problem: MDD (major depressive disorder), recurrent severe, without psychosis ([place]) Diagnosis: Principal Problem:   MDD (major depressive disorder), recurrent severe, without psychosis ([place]) Active Problems:   Suicidal behavior with attempted self-injury ([place])   Cocaine abuse, episodic use ([place])   Alcohol use disorder, moderate, dependence ([place])   Marijuana smoker, continuous   Reason for Admission: Penny Berger is a 46 year old female with a PPH of MDD- recurrent/severe/without psychotic features, post-partum depression, GAD, PNES, and chronic suicidality who presented to the [facility] voluntarily from [facility] for [place] with a plan to cut her wrist. This is hospitalization day 1.  Subjective:  Patient seen an assessed at bedside. She denies HI/AVH but does endorse passive SI. She reports to continue to feel hopeless and "life isn't worth livng".  She is able to contract for safety while on the unit.  Patient continues to feel depressed.  Patient does report sleeping well.  Patient reports appetite has decreased since starting lithium.  Discussed that this is not a likely side effect of lithium but we will continue to monitor.  Patient agreeable to continue lithium but does report "I do not like the way it makes me feel".  Encourage patient to continue this medication for a few days to at least monitor for continued side effects.  Discussed nutritional supplementation with Ensure and patient was agreeable to this.  Patient expresses no other concerns at this time.  Patient reports she is working to attend groups regularly.  Denies any further somatic complaints at this time.  Objective:  Chart Review Past 24 hours of patient's chart was reviewed.  Patient is compliant with scheduled meds. Required Agitation PRNs: none Per RN notes, no documented behavioral issues and is attending group. Patient slept, undocumented  hours  Total Time spent with patient: 45 minutes  Past Psychiatric History:  Previous Psych Diagnoses: GAD, conversion disorder with PNES (2013), MDD, panic attacks. Prior inpatient treatment: Denies Prior outpatient treatment: Denies Psychotherapy hx: [facility] therapist in 2013 History of suicide: 2003- cut wrists History of homicide: Denies Psychiatric medication history: Effexor, Xanax 1 mg BID PRN, Zoloft 100 mg Psychiatric medication compliance history: N/A Neuromodulation history: Denies Current Psychiatrist: Denies Current therapist: Supposed to establish care with [facility], Mrs. Tye Savoy, on Monday  Past Medical History:  Past Medical History:  Diagnosis Date   Seizures ([place])    Stroke ([place])    Syncope     Past Surgical History:  Procedure Laterality Date   APPENDECTOMY     CESAREAN SECTION     Family History: History reviewed. No pertinent family history. Family Psychiatric  History:  Psych: sister- depression  Psych Rx: Unknown  Social History:  Social History   Substance and Sexual Activity  Alcohol Use Yes     Social History   Substance and Sexual Activity  Drug Use Yes   Types: Cocaine, Marijuana    Social History   Socioeconomic History   Marital status: Single    Spouse name: Not on file   Number of children: Not on file   Years of education: Not on file   Highest education level: Not on file  Occupational History   Not on file  Tobacco Use   Smoking status: Every Day    Packs/day: 0.50    Types: Cigarettes   Smokeless tobacco: Never  Vaping Use   Vaping Use: Never used  Substance  and Sexual Activity   Alcohol use: Yes   Drug use: Yes    Types: Cocaine, Marijuana   Sexual activity: Yes  Other Topics Concern   Not on file  Social History Narrative   Not on file   Social Determinants of Health   Financial Resource Strain: Not on file  Food Insecurity: Not on file  Transportation Needs: Not on file   Physical Activity: Not on file  Stress: Not on file  Social Connections: Not on file   Additional Social History:                         Current Medications: Current Facility-Administered Medications  Medication Dose Route Frequency Provider Last Rate Last Admin   acetaminophen (TYLENOL) tablet 650 mg  650 mg Oral Q6H PRN Bobbitt, Shalon E, NP       alum & mag hydroxide-simeth (MAALOX/MYLANTA) 200-200-20 MG/5ML suspension 30 mL  30 mL Oral Q4H PRN Bobbitt, Shalon E, NP       hydrOXYzine (ATARAX) tablet 25 mg  25 mg Oral TID PRN Rosezetta Schlatter, MD   25 mg at 05/03/22 2110   lithium carbonate (LITHOBID) CR tablet 300 mg  300 mg Oral Q12H Rosezetta Schlatter, MD   300 mg at 05/03/22 2110   magnesium hydroxide (MILK OF MAGNESIA) suspension 30 mL  30 mL Oral Daily PRN Bobbitt, Shalon E, NP       nicotine (NICODERM CQ - dosed in mg/24 hours) patch 14 mg  14 mg Transdermal Q0600 Bobbitt, Shalon E, NP       nitrofurantoin (macrocrystal-monohydrate) (MACROBID) capsule 100 mg  100 mg Oral Q12H Cosby, Loma Sousa, MD   100 mg at 05/03/22 2109   polyethylene glycol (MIRALAX / GLYCOLAX) packet 17 g  17 g Oral Daily PRN Rosezetta Schlatter, MD       traZODone (DESYREL) tablet 50 mg  50 mg Oral QHS PRN Bobbitt, Shalon E, NP   50 mg at 05/03/22 2110   venlafaxine XR (EFFEXOR-XR) 24 hr capsule 75 mg  75 mg Oral Q breakfast Rosezetta Schlatter, MD   75 mg at 05/03/22 1255    Lab Results:  No results found for this or any previous visit (from the past 24 hour(s)).  Blood Alcohol level:  Lab Results  Component Value Date   ETH <10 12/14/1599    Metabolic Disorder Labs: Lab Results  Component Value Date   HGBA1C 5.7 (H) 05/02/2022   MPG 116.89 05/02/2022   No results found for: "PROLACTIN" Lab Results  Component Value Date   CHOL 215 (H) 05/02/2022   TRIG 90 05/02/2022   HDL 43 05/02/2022   CHOLHDL 5.0 05/02/2022   VLDL 18 05/02/2022   LDLCALC 154 (H) 05/02/2022    Physical  Findings:  Musculoskeletal: Strength & Muscle Tone: within normal limits Gait & Station: normal  Psychiatric Specialty Exam:  Presentation  General Appearance:  Appropriate for Environment; Casual; Fairly Groomed   Eye Contact: Good   Speech: Clear and Coherent; Normal Rate   Speech Volume: Normal   Handedness: Right    Mood and Affect  Mood: Depressed; Hopeless; Worthless   Affect: Congruent; Depressed    Thought Process  Thought Processes: Coherent; Goal Directed   Descriptions of Associations:Intact   Orientation:Full (Time, Place and Person)   Thought Content:WDL   History of Schizophrenia/Schizoaffective disorder:  Hallucinations:Hallucinations: None  Ideas of Reference:None   Suicidal Thoughts:Suicidal Thoughts: Yes, Passive SI Passive Intent and/or Plan: With  Intent; Without Plan  Homicidal Thoughts:Homicidal Thoughts: No   Sensorium  Memory: Immediate Good; Recent Fair; Remote Poor   Judgment: Poor   Insight: Fair; Shallow    Executive Functions  Concentration: Good   Attention Span: Good   Recall: Good   Fund of Knowledge: Good   Language: Good    Psychomotor Activity  Psychomotor Activity: Psychomotor Activity: Normal   Assets  Assets: Communication Skills; Desire for Improvement; Housing; Leisure Time; Physical Health; Resilience; Social Support; Vocational/Educational    Sleep  Sleep: Sleep: Good    Physical Exam: Review of Systems  Respiratory:  Negative for shortness of breath.   Cardiovascular:  Negative for chest pain.  Gastrointestinal:  Negative for abdominal pain, constipation, diarrhea, heartburn, nausea and vomiting.  Neurological:  Negative for headaches.   Blood pressure (!) 131/93, pulse (!) 119, temperature 98.4 F (36.9 C), temperature source Oral, resp. rate 16, height 5' 4.5" (1.638 m), weight 74.8 kg, SpO2 100 %. Body mass index is 27.87 kg/m.   ASSESSMENT  AND PLAN  Patient is a 46 year old female with a PPH of MDD- recurrent/severe/without psychotic features, post-partum depression, GAD, PNES, and chronic suicidality who presented to the [facility] voluntarily from [facility] for [place] with a plan to cut her wrist.   This is hospitalization day 1.  PLAN Safety and Monitoring: Voluntary admission to inpatient psychiatric unit for safety, stabilization and treatment Daily contact with patient to assess and evaluate symptoms and progress in treatment Patient's case to be discussed in multi-disciplinary team meeting Observation Level : q15 minute checks Vital signs: q12 hours Precautions: suicide, elopement, and assault  #MDD- recurrent/severe/without psychotic features #Acute on Chronic Suicidal Ideation -- Start Effexor 75 mg daily, with plan to increase to 150 mg after a couple of days.  -- Start Lithium carbonate 300 mg BID.             -- TSH and renal function WNL             -- Obtain lithium level in 3-4 days   PRN: Hydroxyzine 25 mg TID for anxiety, Miralax   -- The risks/benefits/side-effects/alternatives to this medication were discussed in detail with the patient and time was given for questions. The patient consents to medication trial.              -- Metabolic profile and EKG monitoring obtained while on an atypical antipsychotic  BMI: 27.87 TSH: 0.831 Lipid Panel: Total cholesterol 215, LDL 154 HbgA1c: 5.7% QTc: 422             -- Encouraged patient to participate in unit milieu and in scheduled group therapies      3. Medical Issues Being Addressed:   #Pre-Diabetes -- Discussed lifestyle changes and need for PCP follow-up   #Hyperlipidemia -- Discussed lifestyle changes and need for PCP follow-up   #UTI Patient nearly completed treatment PTA. -- Contninue Macrobid 100 mg BID x 2 days.   #Chronic Constipation -- Miralax available daily PRN   4. Discharge Planning: Social work and case management to assist with discharge  planning and identification of hospital follow-up needs prior to discharge Estimated LOS: 5-7 days Discharge Concerns: Need to establish a safety plan; Medication compliance and effectiveness Discharge Goals: Return home with outpatient referrals for mental health follow-up including medication management/psychotherapy     France Ravens, MD 05/04/2022, 8:40 AM

## 2022-05-04 NOTE — BHH Group Notes (Signed)
Adult Psychoeducational Group Note Date:  05/04/2022 Time:  0900-1000 Group Topic/Focus: PROGRESSIVE RELAXATION. A group where deep breathing is taught and tensing and relaxation muscle groups is used. Imagery is used as well.  Pts are asked to imagine 3 pillars that hold them up when they are not able to hold themselves up and to share that with the group.   Participation Level:  did not attend  : Penny Berger  A   

## 2022-05-04 NOTE — BHH Group Notes (Signed)
Adult Psychoeducational Group  Date:  05/04/2022 Time:  1300-1400  Group Topic/Focus: Continuation of the group from Saturday. Looking at the lists that were created and talking about what needs to be done with the homework of 30 positives about themselves.                                     Talking about taking their power back and helping themselves to develop a positive self esteem.      Participation Quality:  lacking Affect:  flat  Cognitive:  Oriented  Insight  :lacking  Engagement in Group:  not Engaged  Modes of Intervention:  Activity, Discussion, Education, and Support  Additional Comments:  States she doesn't know how she is feeling. Would not give a number. Sat and didn't say anything while in the group.  Paulino Rily

## 2022-05-04 NOTE — Progress Notes (Signed)
   05/04/22 2100  Psych Admission Type (Psych Patients Only)  Admission Status Voluntary  Psychosocial Assessment  Patient Complaints Anxiety  Eye Contact Brief  Facial Expression Anxious  Affect Appropriate to circumstance  Speech Logical/coherent  Interaction Avoidant  Motor Activity Slow  Appearance/Hygiene Unremarkable  Behavior Characteristics Cooperative  Mood Sad  Aggressive Behavior  Effect No apparent injury  Thought Process  Coherency WDL  Content WDL  Delusions WDL  Perception WDL  Hallucination None reported or observed  Judgment Poor  Confusion None  Danger to Self  Current suicidal ideation? Denies  Danger to Others  Danger to Others None reported or observed

## 2022-05-04 NOTE — Group Note (Signed)
LCSW Group Therapy  Type of Therapy and Topic:  Group Therapy: Thoughts, Feelings, and Actions  Participation Level:  Active   Description of Group:   In this group, each patient discussed their previous experiencing and understanding of overthinking, identifying the harmful impact on their lives. As a group, each patient was introduced to the basic concepts of Cognitive Behavioral Therapy: that thoughts, feelings, and actions are all connected and influence one another. They were given examples of how overthinking can affect our feelings, actions, and vise versa. The group was then asked to analyze how overthinking was harmful and brainstorm alternative thinking patterns/reactions to the example situation. Then, each group member filled out and identified their own example situation in which a problem situation caused their thoughts, feelings, and actions to be negatively impacted; they were asked to come up with 3 new (more adaptive/positive) thoughts that led to 3 new feelings and actions.  Therapeutic Goals: Patients will review and discuss their past experience with overthinking. Patients will learn the basics of the CBT model through group-led examples.. Patients will identify situations where they may have negative thoughts, feelings, or actions and will then reframe the situation using more positive thoughts to react differently.  Summary of Patient Progress:  The patient shared that they she believes "I deserve to live." Patient contributed to the discussion of how thoughts, feelings, and actions interact, noting when they may have experienced a negative thought pattern and recognized it as harmful. They were attentive when other patients shared their experiences, and worked to reframe their own thoughts in an activity to identify future situations where they may typically overthink.  Therapeutic Modalities:   Cognitive Behavioral Therapy Mindfulness  Zachery Conch,  LCSW 05/04/2022  10:58 AM

## 2022-05-04 NOTE — Progress Notes (Signed)
D. Pt presented with an anxious affect/ depressed mood with complaints of having  'no appetite' with some stomach upset. Pt initially refused her morning dose of lithium , but shortly after agreed to take it, to give  another day or so to see if the GI side effects improve. Pt currently denies SI/HI and AVH  A. Labs and vitals monitored. Pt given and educated on medications. Pt supported emotionally and encouraged to express concerns and ask questions.   R. Pt remains safe with 15 minute checks. Will continue POC.

## 2022-05-05 ENCOUNTER — Encounter (HOSPITAL_COMMUNITY): Payer: Self-pay

## 2022-05-05 DIAGNOSIS — F332 Major depressive disorder, recurrent severe without psychotic features: Secondary | ICD-10-CM | POA: Diagnosis not present

## 2022-05-05 MED ORDER — VENLAFAXINE HCL ER 75 MG PO CP24
112.5000 mg | ORAL_CAPSULE | Freq: Every day | ORAL | Status: DC
  Administered 2022-05-06 – 2022-05-07 (×2): 112.5 mg via ORAL
  Filled 2022-05-05 (×6): qty 1

## 2022-05-05 MED ORDER — VENLAFAXINE HCL ER 150 MG PO CP24
150.0000 mg | ORAL_CAPSULE | Freq: Every day | ORAL | Status: DC
  Filled 2022-05-05 (×2): qty 1

## 2022-05-05 NOTE — Group Note (Signed)
Recreation Therapy Group Note   Group Topic:Stress Management  Group Date: 05/05/2022 Start Time: 0930 End Time: 0945 Facilitators: Ilia Engelbert-McCall, LRT,CTRS Location: [address] Dayroom   Goal Area(s) Addresses:  Patient will identify positive stress management techniques. Patient will identify benefits of using stress management post d/c.  Group Description:  Meditation.  LRT explained the concept of meditation to patients.  LRT then played a meditation that focused on morning renewal for a new day.  The meditation expressed being able see each day as a new beginning.    Affect/Mood: N/A   Participation Level: Did not attend    Clinical Observations/Individualized Feedback:    Plan: Continue to engage patient in RT group sessions 2-3x/week.   Seleste Tallman-McCall, LRT,CTRS  05/05/2022 11:24 AM

## 2022-05-05 NOTE — Progress Notes (Signed)
D. Pt continues to present depressed and guarded- very minimal during interactions. Per pt's self inventory, pt rated her depression and hopelessness both 10/10. Pt did not attend groups today despite encouragement from staff. Pt continues to deny SI/HI and AVH  A. Labs and vitals monitored. Pt given and educated on medications. Pt supported emotionally and encouraged to express concerns and ask questions.   R. Pt remains safe with 15 minute checks. Will continue POC.

## 2022-05-05 NOTE — BH IP Treatment Plan (Signed)
Interdisciplinary Treatment and Diagnostic Plan Update  05/05/2022 Time of Session: 9:35am Penny Berger MRN: [id number]  Principal Diagnosis: MDD (major depressive disorder), recurrent severe, without psychosis ([place])  Secondary Diagnoses: Principal Problem:   MDD (major depressive disorder), recurrent severe, without psychosis ([place]) Active Problems:   Suicidal behavior with attempted self-injury ([place])   Cocaine abuse, episodic use ([place])   Alcohol use disorder, moderate, dependence ([place])   Marijuana smoker, continuous   Current Medications:  Current Facility-Administered Medications  Medication Dose Route Frequency Provider Last Rate Last Admin   acetaminophen (TYLENOL) tablet 650 mg  650 mg Oral Q6H PRN Bobbitt, Shalon E, NP       alum & mag hydroxide-simeth (MAALOX/MYLANTA) 200-200-20 MG/5ML suspension 30 mL  30 mL Oral Q4H PRN Bobbitt, Shalon E, NP       hydrOXYzine (ATARAX) tablet 25 mg  25 mg Oral TID PRN Rosezetta Schlatter, MD   25 mg at 05/04/22 2122   magnesium hydroxide (MILK OF MAGNESIA) suspension 30 mL  30 mL Oral Daily PRN Bobbitt, Shalon E, NP       nicotine (NICODERM CQ - dosed in mg/24 hours) patch 14 mg  14 mg Transdermal Q0600 Bobbitt, Shalon E, NP   14 mg at 05/05/22 0802   polyethylene glycol (MIRALAX / GLYCOLAX) packet 17 g  17 g Oral Daily PRN Rosezetta Schlatter, MD   17 g at 05/05/22 0802   traZODone (DESYREL) tablet 50 mg  50 mg Oral QHS PRN Bobbitt, Shalon E, NP   50 mg at 05/04/22 2123   [START ON 05/06/2022] venlafaxine XR (EFFEXOR-XR) 24 hr capsule 150 mg  150 mg Oral Q breakfast France Ravens, MD       PTA Medications: Medications Prior to Admission  Medication Sig Dispense Refill Last Dose   Brimonidine Tartrate (LUMIFY) 0.025 % SOLN Place 2 drops into both eyes daily.      medroxyPROGESTERone (DEPO-PROVERA) 150 MG/ML injection Inject 150 mg into the muscle every 3 (three) months.      nitrofurantoin, macrocrystal-monohydrate, (MACROBID) 100 MG capsule Take  100 mg by mouth 2 (two) times daily. Take for 5 days starting on 04/29/22.       Patient Stressors: Financial difficulties   Marital or family conflict   Occupational concerns   Substance abuse    Patient Strengths: Physical Health  Supportive family/friends  Work skills   Treatment Modalities: Medication Management, Group therapy, Case management,  1 to 1 session with clinician, Psychoeducation, Recreational therapy.   Physician Treatment Plan for Primary Diagnosis: MDD (major depressive disorder), recurrent severe, without psychosis ([place]) Long Term Goal(s): Improvement in symptoms so as ready for discharge   Short Term Goals: Ability to identify changes in lifestyle to reduce recurrence of condition will improve Ability to verbalize feelings will improve Ability to disclose and discuss suicidal ideas Ability to demonstrate self-control will improve Ability to identify and develop effective coping behaviors will improve Ability to maintain clinical measurements within normal limits will improve Compliance with prescribed medications will improve  Medication Management: Evaluate patient's response, side effects, and tolerance of medication regimen.  Therapeutic Interventions: 1 to 1 sessions, Unit Group sessions and Medication administration.  Evaluation of Outcomes: Progressing  Physician Treatment Plan for Secondary Diagnosis: Principal Problem:   MDD (major depressive disorder), recurrent severe, without psychosis ([place]) Active Problems:   Suicidal behavior with attempted self-injury ([place])   Cocaine abuse, episodic use ([place])   Alcohol use disorder, moderate, dependence ([place])   Marijuana smoker, continuous  Long Term Goal(s): Improvement in symptoms so as ready for discharge   Short Term Goals: Ability to identify changes in lifestyle to reduce recurrence of condition will improve Ability to verbalize feelings will improve Ability to disclose and discuss suicidal  ideas Ability to demonstrate self-control will improve Ability to identify and develop effective coping behaviors will improve Ability to maintain clinical measurements within normal limits will improve Compliance with prescribed medications will improve     Medication Management: Evaluate patient's response, side effects, and tolerance of medication regimen.  Therapeutic Interventions: 1 to 1 sessions, Unit Group sessions and Medication administration.  Evaluation of Outcomes: Progressing   RN Treatment Plan for Primary Diagnosis: MDD (major depressive disorder), recurrent severe, without psychosis ([place]) Long Term Goal(s): Knowledge of disease and therapeutic regimen to maintain health will improve  Short Term Goals: Ability to remain free from injury will improve, Ability to verbalize frustration and anger appropriately will improve, Ability to demonstrate self-control, Ability to participate in decision making will improve, Ability to verbalize feelings will improve, Ability to disclose and discuss suicidal ideas, Ability to identify and develop effective coping behaviors will improve, and Compliance with prescribed medications will improve  Medication Management: RN will administer medications as ordered by provider, will assess and evaluate patient's response and provide education to patient for prescribed medication. RN will report any adverse and/or side effects to prescribing provider.  Therapeutic Interventions: 1 on 1 counseling sessions, Psychoeducation, Medication administration, Evaluate responses to treatment, Monitor vital signs and CBGs as ordered, Perform/monitor CIWA, COWS, AIMS and Fall Risk screenings as ordered, Perform wound care treatments as ordered.  Evaluation of Outcomes: Progressing   LCSW Treatment Plan for Primary Diagnosis: MDD (major depressive disorder), recurrent severe, without psychosis ([place]) Long Term Goal(s): Safe transition to appropriate next level of  care at discharge, Engage patient in therapeutic group addressing interpersonal concerns.  Short Term Goals: Engage patient in aftercare planning with referrals and resources, Increase social support, Increase ability to appropriately verbalize feelings, Increase emotional regulation, Facilitate acceptance of mental health diagnosis and concerns, Facilitate patient progression through stages of change regarding substance use diagnoses and concerns, Identify triggers associated with mental health/substance abuse issues, and Increase skills for wellness and recovery  Therapeutic Interventions: Assess for all discharge needs, 1 to 1 time with Social worker, Explore available resources and support systems, Assess for adequacy in community support network, Educate family and significant other(s) on suicide prevention, Complete Psychosocial Assessment, Interpersonal group therapy.  Evaluation of Outcomes: Progressing   Progress in Treatment: Attending groups: Yes. Participating in groups: Yes. Taking medication as prescribed: Yes. Toleration medication: Yes. Family/Significant other contact made: Yes, individual(s) contacted:  Herbie [place] (father) [phone] Patient understands diagnosis: Yes. Discussing patient identified problems/goals with staff: No. Medical problems stabilized or resolved: Yes. Denies suicidal/homicidal ideation: Yes. Issues/concerns per patient self-inventory: No.   New problem(s) identified: No, Describe:  none reported  New Short Term/Long Term Goal(s):   medication stabilization, elimination of SI thoughts, development of comprehensive mental wellness plan.    Patient Goals:  Pt would not provide a goal and would not sign off that she participated in treatment team.   Discharge Plan or Barriers:  Patient recently admitted. CSW will continue to follow and assess for appropriate referrals and possible discharge planning.    Reason for Continuation of Hospitalization:  Anxiety Depression Medication stabilization  Estimated Length of Stay: 3-5 days  Last 3 [place] Suicide Severity Risk Score: Flowsheet Row Admission (Current) from [facility]03/2022 in [facility] from 05/02/2022 in [facility] from 07/02/2021 in [facility]  C-SSRS RISK CATEGORY No Risk High Risk No Risk       Last PHQ 2/9 Scores:     No data to display          Scribe for Treatment Team: Zachery Conch, LCSW 05/05/2022 10:23 AM

## 2022-05-05 NOTE — [facility] Group Notes (Signed)
Spiritual care group on grief and loss facilitated by chaplain Katy Gretta Samons, B[facility]   Group Goal:   Support / Education around grief and loss   Members engage in facilitated group support and psycho-social education.   Group Description:   Following introductions and group rules, group members engaged in facilitated group dialog and support around topic of loss, with particular support around experiences of loss in their lives. Group Identified types of loss (relationships / self / things) and identified patterns, circumstances, and changes that precipitate losses. Reflected on thoughts / feelings around loss, normalized grief responses, and recognized variety in grief experience. Group noted Worden's four tasks of grief in discussion.   Group drew on Adlerian / Rogerian, narrative, MI,   Patient Progress: Did not attend.  

## 2022-05-05 NOTE — Progress Notes (Signed)
Psychoeducational Group Note  Date:  05/05/2022 Time:  2123  Group Topic/Focus:  Relapse Prevention Planning:   The focus of this group is to define relapse and discuss the need for planning to combat relapse.  Participation Level: Did Not Attend  Participation Quality:  Not Applicable  Affect:  Not Applicable  Cognitive:  Not Applicable  Insight:  Not Applicable  Engagement in Group: Not Applicable  Additional Comments:  The patient did not attend the evening meeting.   Archie Balboa S 05/05/2022, 9:23 PM

## 2022-05-05 NOTE — Progress Notes (Signed)
   05/05/22 2015  Psych Admission Type (Psych Patients Only)  Admission Status Voluntary  Psychosocial Assessment  Patient Complaints Anxiety  Eye Contact Brief  Facial Expression Anxious  Affect Appropriate to circumstance  Speech Logical/coherent  Interaction Avoidant  Motor Activity Slow  Appearance/Hygiene Unremarkable  Behavior Characteristics Cooperative  Mood Anxious;Sad  Aggressive Behavior  Effect No apparent injury  Thought Process  Coherency WDL  Content WDL  Delusions WDL  Perception WDL  Hallucination None reported or observed  Judgment Poor  Confusion None  Danger to Self  Current suicidal ideation? Denies  Danger to Others  Danger to Others None reported or observed

## 2022-05-05 NOTE — Progress Notes (Signed)
   05/05/22 0545  Sleep  Number of Hours 5

## 2022-05-05 NOTE — Progress Notes (Signed)
[facility] MD Progress Note  05/05/2022 10:20 AM Penny Berger  MRN:  [id number] Principal Problem: MDD (major depressive disorder), recurrent severe, without psychosis ([place]) Diagnosis: Principal Problem:   MDD (major depressive disorder), recurrent severe, without psychosis ([place]) Active Problems:   Suicidal behavior with attempted self-injury ([place])   Cocaine abuse, episodic use ([place])   Alcohol use disorder, moderate, dependence ([place])   Marijuana smoker, continuous   Reason for Admission: Penny Berger is a 46 year old female with a PPH of MDD- recurrent/severe/without psychotic features, post-partum depression, GAD, PNES, and chronic suicidality who presented to the [facility] voluntarily from [facility] for [place] with a plan to cut her wrist. This is hospitalization day 2.  Subjective:  Patient seen an assessed at bedside. She denies HI/AVH but continues to endorse passive SI. States eating and sleeping appropriately. She reports to continue to feel hopeless and worthless. She was more specific today that she feels she suicidal because of that way she thinks of herself.  She is able to contract for safety while on the unit.  Patient continues to feel depressed.  Patient does report sleeping well.  Patient continues to be low and attributes it to lithium. She also complains of headache and dry mouth and again states "I do not like the way Lithium makes me feel". We discussed discontinuing lithium and increasing effexor for which patient was agreeable. States good benefit from hydroxyzine for anxiety. Patient expresses no other concerns at this time.  Patient reports she is working to attend groups regularly.   Objective:  Chart Review Past 24 hours of patient's chart was reviewed.  Patient is compliant with scheduled meds. Required Agitation PRNs: none Per RN notes, no documented behavioral issues and is attending group. Patient slept, undocumented hours  Total Time spent with patient: 45 minutes  Past  Psychiatric History:  Previous Psych Diagnoses: GAD, conversion disorder with PNES (2013), MDD, panic attacks. Prior inpatient treatment: Denies Prior outpatient treatment: Denies Psychotherapy hx: [facility] therapist in 2013 History of suicide: 2003- cut wrists History of homicide: Denies Psychiatric medication history: Effexor, Xanax 1 mg BID PRN, Zoloft 100 mg Psychiatric medication compliance history: N/A Neuromodulation history: Denies Current Psychiatrist: Denies Current therapist: Supposed to establish care with [facility], Mrs. Tye Savoy, on Monday  Past Medical History:  Past Medical History:  Diagnosis Date   Seizures ([place])    Stroke ([place])    Syncope     Past Surgical History:  Procedure Laterality Date   APPENDECTOMY     CESAREAN SECTION     Family History: History reviewed. No pertinent family history. Family Psychiatric  History:  Psych: sister- depression  Psych Rx: Unknown  Social History:  Social History   Substance and Sexual Activity  Alcohol Use Yes     Social History   Substance and Sexual Activity  Drug Use Yes   Types: Cocaine, Marijuana    Social History   Socioeconomic History   Marital status: Single    Spouse name: Not on file   Number of children: Not on file   Years of education: Not on file   Highest education level: Not on file  Occupational History   Not on file  Tobacco Use   Smoking status: Every Day    Packs/day: 0.50    Types: Cigarettes   Smokeless tobacco: Never  Vaping Use   Vaping Use: Never used  Substance and Sexual Activity   Alcohol use: Yes   Drug use: Yes  Types: Cocaine, Marijuana   Sexual activity: Yes  Other Topics Concern   Not on file  Social History Narrative   Not on file   Social Determinants of Health   Financial Resource Strain: Not on file  Food Insecurity: Not on file  Transportation Needs: Not on file  Physical Activity: Not on file  Stress: Not on file   Social Connections: Not on file   Additional Social History:                         Current Medications: Current Facility-Administered Medications  Medication Dose Route Frequency Provider Last Rate Last Admin   acetaminophen (TYLENOL) tablet 650 mg  650 mg Oral Q6H PRN Bobbitt, Shalon E, NP       alum & mag hydroxide-simeth (MAALOX/MYLANTA) 200-200-20 MG/5ML suspension 30 mL  30 mL Oral Q4H PRN Bobbitt, Shalon E, NP       feeding supplement (ENSURE ENLIVE / ENSURE PLUS) liquid 237 mL  237 mL Oral BID BM France Ravens, MD   237 mL at 05/04/22 0941   hydrOXYzine (ATARAX) tablet 25 mg  25 mg Oral TID PRN Rosezetta Schlatter, MD   25 mg at 05/04/22 2122   magnesium hydroxide (MILK OF MAGNESIA) suspension 30 mL  30 mL Oral Daily PRN Bobbitt, Shalon E, NP       nicotine (NICODERM CQ - dosed in mg/24 hours) patch 14 mg  14 mg Transdermal Q0600 Bobbitt, Shalon E, NP   14 mg at 05/05/22 0802   polyethylene glycol (MIRALAX / GLYCOLAX) packet 17 g  17 g Oral Daily PRN Rosezetta Schlatter, MD   17 g at 05/05/22 0802   traZODone (DESYREL) tablet 50 mg  50 mg Oral QHS PRN Bobbitt, Shalon E, NP   50 mg at 05/04/22 2123   [START ON 05/06/2022] venlafaxine XR (EFFEXOR-XR) 24 hr capsule 150 mg  150 mg Oral Q breakfast France Ravens, MD        Lab Results:  No results found for this or any previous visit (from the past 24 hour(s)).  Blood Alcohol level:  Lab Results  Component Value Date   ETH <10 12/14/1599    Metabolic Disorder Labs: Lab Results  Component Value Date   HGBA1C 5.7 (H) 05/02/2022   MPG 116.89 05/02/2022   Lab Results  Component Value Date   PROLACTIN 5.0 05/02/2022   Lab Results  Component Value Date   CHOL 215 (H) 05/02/2022   TRIG 90 05/02/2022   HDL 43 05/02/2022   CHOLHDL 5.0 05/02/2022   VLDL 18 05/02/2022   LDLCALC 154 (H) 05/02/2022    Physical Findings:  Musculoskeletal: Strength & Muscle Tone: within normal limits Gait & Station: normal  Psychiatric  Specialty Exam:  Presentation  General Appearance:  Appropriate for Environment; Casual   Eye Contact: Minimal   Speech: Clear and Coherent; Normal Rate   Speech Volume: Normal   Handedness: Right    Mood and Affect  Mood: Depressed; Anxious; Hopeless; Worthless   Affect: Depressed    Thought Process  Thought Processes: Coherent   Descriptions of Associations:Intact   Orientation:Full (Time, Place and Person)   Thought Content:Logical   History of Schizophrenia/Schizoaffective disorder:  Hallucinations:Hallucinations: None   Ideas of Reference:None   Suicidal Thoughts:Suicidal Thoughts: Yes, Passive SI Passive Intent and/or Plan: Without Intent; Without Plan; Without Means to Carry Out   Homicidal Thoughts:Homicidal Thoughts: No    Sensorium  Memory: Immediate Good; Recent Good;  Remote Good   Judgment: Poor   Insight: Shallow    Executive Functions  Concentration: Good   Attention Span: Good   Recall: Good   Fund of Knowledge: Good   Language: Good    Psychomotor Activity  Psychomotor Activity: Psychomotor Activity: Normal    Assets  Assets: Communication Skills; Desire for Improvement; Housing; Leisure Time; Physical Health; Resilience; Social Support; Vocational/Educational    Sleep  Sleep: Sleep: Good     Physical Exam: Review of Systems  Respiratory:  Negative for shortness of breath.   Cardiovascular:  Negative for chest pain.  Gastrointestinal:  Negative for abdominal pain, constipation, diarrhea, heartburn, nausea and vomiting.  Neurological:  Negative for headaches.   Blood pressure 107/81, pulse (!) 115, temperature 98.8 F (37.1 C), temperature source Oral, resp. rate 14, height 5' 4.5" (1.638 m), weight 74.8 kg, SpO2 100 %. Body mass index is 27.87 kg/m.   ASSESSMENT AND PLAN  Patient is a 46 year old female with a PPH of MDD- recurrent/severe/without psychotic features,  post-partum depression, GAD, PNES, and chronic suicidality who presented to the [facility] voluntarily from [facility] for [place] with a plan to cut her wrist.   This is hospitalization day 2.  PLAN Safety and Monitoring: Voluntary admission to inpatient psychiatric unit for safety, stabilization and treatment Daily contact with patient to assess and evaluate symptoms and progress in treatment Patient's case to be discussed in multi-disciplinary team meeting Observation Level : q15 minute checks Vital signs: q12 hours Precautions: suicide, elopement, and assault  #MDD- recurrent/severe/without psychotic features #Acute on Chronic Suicidal Ideation -- INCREASE Effexor to 150 mg daily for refractory depressive symptoms -- DISCONTINUE Lithium carbonate 300 mg BID.   PRN: Hydroxyzine 25 mg TID for anxiety, Miralax   -- The risks/benefits/side-effects/alternatives to this medication were discussed in detail with the patient and time was given for questions. The patient consents to medication trial.              -- Metabolic profile and EKG monitoring obtained while on an atypical antipsychotic  BMI: 27.87 TSH: 0.831 Lipid Panel: Total cholesterol 215, LDL 154 HbgA1c: 5.7% QTc: 422             -- Encouraged patient to participate in unit milieu and in scheduled group therapies      3. Medical Issues Being Addressed:   #Pre-Diabetes -- Discussed lifestyle changes and need for PCP follow-up   #Hyperlipidemia -- Discussed lifestyle changes and need for PCP follow-up   #UTI, treated -Completed macrobid course 05/04/22   #Chronic Constipation -- Miralax available daily PRN   4. Discharge Planning: Social work and case management to assist with discharge planning and identification of hospital follow-up needs prior to discharge Estimated LOS: 5-7 days Discharge Concerns: Need to establish a safety plan; Medication compliance and effectiveness Discharge Goals: Return home with outpatient referrals  for mental health follow-up including medication management/psychotherapy     France Ravens, MD 05/05/2022, 10:20 AM

## 2022-05-06 DIAGNOSIS — F332 Major depressive disorder, recurrent severe without psychotic features: Secondary | ICD-10-CM | POA: Diagnosis not present

## 2022-05-06 MED ORDER — ARIPIPRAZOLE 2 MG PO TABS
2.0000 mg | ORAL_TABLET | Freq: Every day | ORAL | Status: DC
  Administered 2022-05-06 – 2022-05-08 (×3): 2 mg via ORAL
  Filled 2022-05-06 (×4): qty 1
  Filled 2022-05-06: qty 7
  Filled 2022-05-06: qty 1

## 2022-05-06 NOTE — Progress Notes (Signed)
   05/06/22 1300  Psych Admission Type (Psych Patients Only)  Admission Status Voluntary  Psychosocial Assessment  Patient Complaints Anxiety  Eye Contact Brief  Facial Expression Anxious  Affect Appropriate to circumstance  Speech Logical/coherent  Interaction Minimal  Motor Activity Slow  Appearance/Hygiene Unremarkable  Behavior Characteristics Cooperative  Mood Anxious  Thought Process  Coherency WDL  Content WDL  Delusions WDL  Perception WDL  Hallucination None reported or observed  Judgment Poor  Confusion None  Danger to Self  Current suicidal ideation? Denies  Danger to Others  Danger to Others None reported or observed

## 2022-05-06 NOTE — Group Note (Signed)
Recreation Therapy Group Note   Group Topic:Animal Assisted Therapy   Group Date: 05/06/2022 Start Time: 1430 End Time: 1509 Facilitators: Ayvin Lipinski-McCall, LRT,CTRS Location: [address] Dayroom   Animal-Assisted Activity (AAA) Program Checklist/Progress Notes Patient Eligibility Criteria Checklist & Daily Group note for Rec Tx Intervention  AAA/T Program Assumption of Risk Form signed by Patient/ or Parent Legal Guardian Yes  Patient is free of allergies or severe asthma Yes  Patient reports no fear of animals Yes  Patient reports no history of cruelty to animals Yes  Patient understands his/her participation is voluntary Yes  Patient washes hands before animal contact Yes  Patient washes hands after animal contact Yes   Affect/Mood: Appropriate   Participation Level: Minimal   Participation Quality: Independent   Behavior: Appropriate    Clinical Observations/Individualized Feedback: Patient attended session and interacted appropriately with therapy dog and peers. Patient asked appropriate questions about therapy dog and his training. Patient shared stories about their pets at home with group.      Plan: Continue to engage patient in RT group sessions 2-3x/week.   Jonea Bukowski-McCall, LRT,CTRS 05/06/2022 3:50 PM

## 2022-05-06 NOTE — Group Note (Signed)
LCSW Group Therapy Note   Group Date: 05/06/2022 Start Time: 1300 End Time: 1400  Type of Therapy and Topic:  Group Therapy:  Protective Factors    Participation Level:  Active  Description of Group: This group addressed positive supports and protective factors. Patients were given a worksheet with a blank shield. Patients were asked what a shield is and when it is used. Patients were asked to list, draw, or write protective factors in their lives on their shields. Patients discussed the words, ideas and drawings that they put on their shield. Patients were encouraged to have a daily reflection of positive characteristics/ protective factors.  Therapeutic Goals Patient will verbalize two of their positive qualities Patient will demonstrate insight but naming social supports in their lives Patient will verbalize their feelings when voicing positive self-affirmations and when voicing positive affirmations of others Patients will discuss the potential positive impact on their wellness/recovery of focusing on positive traits of self and others.  Summary of Patient Progress: The Pt attended group and remained there the entire time.  The Pt accepted all materials and worksheets that were provided.  The Pt was appropriate towards their peers and shared in the group discussion.  The Pt was able to identify protective factors and supports within their daily lives that can help them the next time they are experiencing stress.   Darleen Crocker, LCSWA 05/06/2022  11:49 AM

## 2022-05-06 NOTE — Progress Notes (Signed)
   05/06/22 0500  Sleep  Number of Hours 7

## 2022-05-06 NOTE — Progress Notes (Addendum)
[facility] MD Progress Note  05/06/2022 8:11 AM Penny Berger  MRN:  [id number] Principal Problem: MDD (major depressive disorder), recurrent severe, without psychosis ([place]) Diagnosis: Principal Problem:   MDD (major depressive disorder), recurrent severe, without psychosis ([place]) Active Problems:   Suicidal behavior with attempted self-injury ([place])   Cocaine abuse, episodic use ([place])   Alcohol use disorder, moderate, dependence ([place])   Marijuana smoker, continuous   Reason for Admission: Penny Berger is a 46 year old female with a PPH of MDD- recurrent/severe/without psychotic features, post-partum depression, GAD, PNES, and chronic suicidality who presented to the [facility] voluntarily from [facility] for SI with a plan to cut her wrist. This is hospitalization day 3.  Subjective:  Patient seen an assessed at bedside.  Patient denies SI/HI/AVH.  States that she was sleeping and eating more appropriately today.  Patient reports headache and appetite loss have largely resolved today and yesterday after discontinuation of lithium.  Patient still endorses occasional dry mouth.  I discussed with her that this is likely an anticholinergic effect of hydroxyzine.  Encouraged continued fluid hydration.  She reports feeling more goal-directed and future oriented today.  She states that she is working doing bills to pay.  Patient does feel overall that her depression is improving.  Encourage patient to attend groups that she has been having difficulty with this given she thinks they are not helpful; however, she is willing to attend groups at this time.  She denies any other somatic complaints from medication adjustments.  She understands that we are increasing her Effexor for depression today. We discussed starting Abilify as an adjunct for her depression.  Patient was agreeable to this plan.   Objective:  Chart Review Past 24 hours of patient's chart was reviewed.  Patient is compliant with scheduled meds. Required  Agitation PRNs: none Per RN notes, no documented behavioral issues and is attending group. Patient slept, undocumented hours  Total Time spent with patient: 45 minutes  Past Psychiatric History:  Previous Psych Diagnoses: GAD, conversion disorder with PNES (2013), MDD, panic attacks. Prior inpatient treatment: Denies Prior outpatient treatment: Denies Psychotherapy hx: [facility] therapist in 2013 History of suicide: 2003- cut wrists History of homicide: Denies Psychiatric medication history: Effexor, Xanax 1 mg BID PRN, Zoloft 100 mg Psychiatric medication compliance history: N/A Neuromodulation history: Denies Current Psychiatrist: Denies Current therapist: Supposed to establish care with [facility], Mrs. Tye Savoy, on Monday  Past Medical History:  Past Medical History:  Diagnosis Date   Seizures ([place])    Stroke ([place])    Syncope     Past Surgical History:  Procedure Laterality Date   APPENDECTOMY     CESAREAN SECTION     Family History: History reviewed. No pertinent family history. Family Psychiatric  History:  Psych: sister- depression  Psych Rx: Unknown  Social History:  Social History   Substance and Sexual Activity  Alcohol Use Yes     Social History   Substance and Sexual Activity  Drug Use Yes   Types: Cocaine, Marijuana    Social History   Socioeconomic History   Marital status: Single    Spouse name: Not on file   Number of children: Not on file   Years of education: Not on file   Highest education level: Not on file  Occupational History   Not on file  Tobacco Use   Smoking status: Every Day    Packs/day: 0.50    Types: Cigarettes   Smokeless tobacco: Never  Vaping Use   Vaping Use: Never used  Substance and Sexual Activity   Alcohol use: Yes   Drug use: Yes    Types: Cocaine, Marijuana   Sexual activity: Yes  Other Topics Concern   Not on file  Social History Narrative   Not on file   Social  Determinants of Health   Financial Resource Strain: Not on file  Food Insecurity: Not on file  Transportation Needs: Not on file  Physical Activity: Not on file  Stress: Not on file  Social Connections: Not on file   Additional Social History:                         Current Medications: Current Facility-Administered Medications  Medication Dose Route Frequency Provider Last Rate Last Admin   acetaminophen (TYLENOL) tablet 650 mg  650 mg Oral Q6H PRN Bobbitt, Shalon E, NP       alum & mag hydroxide-simeth (MAALOX/MYLANTA) 200-200-20 MG/5ML suspension 30 mL  30 mL Oral Q4H PRN Bobbitt, Shalon E, NP       hydrOXYzine (ATARAX) tablet 25 mg  25 mg Oral TID PRN Rosezetta Schlatter, MD   25 mg at 05/05/22 2116   magnesium hydroxide (MILK OF MAGNESIA) suspension 30 mL  30 mL Oral Daily PRN Bobbitt, Shalon E, NP       nicotine (NICODERM CQ - dosed in mg/24 hours) patch 14 mg  14 mg Transdermal Q0600 Bobbitt, Shalon E, NP   14 mg at 05/05/22 0802   polyethylene glycol (MIRALAX / GLYCOLAX) packet 17 g  17 g Oral Daily PRN Rosezetta Schlatter, MD   17 g at 05/05/22 0802   traZODone (DESYREL) tablet 50 mg  50 mg Oral QHS PRN Bobbitt, Shalon E, NP   50 mg at 05/05/22 2116   venlafaxine XR (EFFEXOR-XR) 24 hr capsule 112.5 mg  112.5 mg Oral Q breakfast France Ravens, MD        Lab Results:  No results found for this or any previous visit (from the past 24 hour(s)).  Blood Alcohol level:  Lab Results  Component Value Date   ETH <10 16/03/9603    Metabolic Disorder Labs: Lab Results  Component Value Date   HGBA1C 5.7 (H) 05/02/2022   MPG 116.89 05/02/2022   Lab Results  Component Value Date   PROLACTIN 5.0 05/02/2022   Lab Results  Component Value Date   CHOL 215 (H) 05/02/2022   TRIG 90 05/02/2022   HDL 43 05/02/2022   CHOLHDL 5.0 05/02/2022   VLDL 18 05/02/2022   LDLCALC 154 (H) 05/02/2022    Physical Findings:  Musculoskeletal: Strength & Muscle Tone: within normal  limits Gait & Station: normal  Psychiatric Specialty Exam:  Presentation  General Appearance:  Appropriate for Environment; Casual   Eye Contact: Minimal   Speech: Clear and Coherent; Normal Rate   Speech Volume: Normal   Handedness: Right    Mood and Affect  Mood: Depressed; Anxious; Hopeless; Worthless   Affect: Depressed    Thought Process  Thought Processes: Coherent   Descriptions of Associations:Intact   Orientation:Full (Time, Place and Person)   Thought Content:Logical   History of Schizophrenia/Schizoaffective disorder:  Hallucinations:Hallucinations: None   Ideas of Reference:None   Suicidal Thoughts:Suicidal Thoughts: Yes, Passive SI Passive Intent and/or Plan: Without Intent; Without Plan; Without Means to Carry Out   Homicidal Thoughts:Homicidal Thoughts: No    Sensorium  Memory: Immediate Good; Recent Good; Remote Good   Judgment:  Poor   Insight: Shallow    Executive Functions  Concentration: Good   Attention Span: Good   Recall: Good   Fund of Knowledge: Good   Language: Good    Psychomotor Activity  Psychomotor Activity: Psychomotor Activity: Normal    Assets  Assets: Communication Skills; Desire for Improvement; Housing; Leisure Time; Physical Health; Resilience; Social Support; Vocational/Educational    Sleep  Sleep: Sleep: Good     Physical Exam: Review of Systems  Respiratory:  Negative for shortness of breath.   Cardiovascular:  Negative for chest pain.  Gastrointestinal:  Negative for abdominal pain, constipation, diarrhea, heartburn, nausea and vomiting.  Neurological:  Negative for headaches.   Blood pressure 107/81, pulse (!) 115, temperature 98.8 F (37.1 C), temperature source Oral, resp. rate 14, height 5' 4.5" (1.638 m), weight 74.8 kg, SpO2 100 %. Body mass index is 27.87 kg/m.   ASSESSMENT AND PLAN  Patient is a 46 year old female with a PPH of MDD-  recurrent/severe/without psychotic features, post-partum depression, GAD, PNES, and chronic suicidality who presented to the [facility] voluntarily from [facility] for [place] with a plan to cut her wrist.   This is hospitalization day 3.  PLAN Safety and Monitoring: Voluntary admission to inpatient psychiatric unit for safety, stabilization and treatment Daily contact with patient to assess and evaluate symptoms and progress in treatment Patient's case to be discussed in multi-disciplinary team meeting Observation Level : q15 minute checks Vital signs: q12 hours Precautions: suicide, elopement, and assault  #MDD- recurrent/severe/without psychotic features #Acute on Chronic Suicidal Ideation #Stimulant Use Disorder #Cannabis Use Disorder #Nicotine Use Disorder -- INCREASE Effexor to 112.5 mg x2 days THEN INCREASE to 150 mg on Thursday  for refractory depressive symptoms --START abilify 2 mg daily for adjunctive therapy for severe depression  -Qtc 422, A1c 5.7, lipid panel shows hypercholesterolemia but was not fasting -- Discontinued Lithium due to side effects -- NRT   PRN: Hydroxyzine 25 mg TID for anxiety, Miralax   -- The risks/benefits/side-effects/alternatives to this medication were discussed in detail with the patient and time was given for questions. The patient consents to medication trial.              -- Metabolic profile and EKG monitoring obtained while on an atypical antipsychotic  BMI: 27.87 TSH: 0.831 Lipid Panel: Total cholesterol 215, LDL 154 HbgA1c: 5.7% QTc: 422             -- Encouraged patient to participate in unit milieu and in scheduled group therapies      3. Medical Issues Being Addressed:   #Pre-Diabetes -- Discussed lifestyle changes and need for PCP follow-up   #Hyperlipidemia -- Discussed lifestyle changes and need for PCP follow-up   #UTI, treated -Completed macrobid course 05/04/22   #Chronic Constipation -- Miralax available daily PRN   4. Discharge  Planning: Social work and case management to assist with discharge planning and identification of hospital follow-up needs prior to discharge Estimated LOS: 5-7 days Discharge Concerns: Need to establish a safety plan; Medication compliance and effectiveness Discharge Goals: Return home with outpatient referrals for mental health follow-up including medication management/psychotherapy     France Ravens, MD 05/06/2022, 8:11 AM  Total Time Spent in Direct Patient Care:  I personally spent 35 minutes on the unit in direct patient care. The direct patient care time included face-to-face time with the patient, reviewing the patient's chart, communicating with other professionals, and coordinating care. Greater than 50% of this time was spent in  counseling or coordinating care with the patient regarding goals of hospitalization, psycho-education, and discharge planning needs.  I have independently evaluated the patient during a face-to-face assessment. I reviewed the patient's chart, and I participated in key portions of the service. I discussed the case with the [employer], and I agree with the assessment and plan of care as documented in the House Officer's note, as addended by me or notated below:  Pt reports that depression is less which is significant. Pt reports that SI is less frequent which is also very significant. I agree with the plan to augment with abilify now and increase effexor after 2 doses of current dose.   Janine Limbo, MD Psychiatrist

## 2022-05-06 NOTE — Progress Notes (Signed)
The focus of this group is to help patients review their daily goal of treatment and discuss progress on daily workbooks.  Pt attended the evening group and responded to all discussion prompts from the [place]. Pt shared that today was a good day on the unit, the highlight of which were several good groups. "These were some of the best since I've been here."  On the subject of ways to stay well upon discharge, Pt mentioned wanting to attend therapy once again and to be more honest this time. "I didn't really give it a chance last time because I wasn't open, but I want to be now."  Pt rated her day a 6 out of 10 and her affect was appropriate.

## 2022-05-06 NOTE — Progress Notes (Signed)
Pt visible out on the milieu this evening, pt stated she was feeling better, pt stated she went to all her groups, pt has brighter affect    05/06/22 1945  Psych Admission Type (Psych Patients Only)  Admission Status Voluntary  Psychosocial Assessment  Patient Complaints Anxiety  Eye Contact Brief  Facial Expression Anxious  Affect Appropriate to circumstance  Speech Logical/coherent  Interaction Avoidant  Motor Activity Slow  Appearance/Hygiene Unremarkable  Behavior Characteristics Cooperative  Mood Anxious  Aggressive Behavior  Effect No apparent injury  Thought Process  Coherency WDL  Content WDL  Delusions WDL  Perception WDL  Hallucination None reported or observed  Judgment Poor  Confusion None  Danger to Self  Current suicidal ideation? Denies  Danger to Others  Danger to Others None reported or observed

## 2022-05-07 DIAGNOSIS — F332 Major depressive disorder, recurrent severe without psychotic features: Secondary | ICD-10-CM | POA: Diagnosis not present

## 2022-05-07 MED ORDER — BISACODYL 5 MG PO TBEC
5.0000 mg | DELAYED_RELEASE_TABLET | Freq: Every day | ORAL | Status: DC
  Filled 2022-05-07 (×4): qty 1

## 2022-05-07 MED ORDER — VENLAFAXINE HCL ER 37.5 MG PO CP24
37.5000 mg | ORAL_CAPSULE | Freq: Once | ORAL | Status: AC
  Administered 2022-05-07: 37.5 mg via ORAL
  Filled 2022-05-07: qty 1

## 2022-05-07 MED ORDER — VENLAFAXINE HCL ER 150 MG PO CP24
150.0000 mg | ORAL_CAPSULE | Freq: Every day | ORAL | Status: DC
  Administered 2022-05-08: 150 mg via ORAL
  Filled 2022-05-07 (×2): qty 7
  Filled 2022-05-07 (×2): qty 1

## 2022-05-07 MED ORDER — BISACODYL 5 MG PO TBEC
10.0000 mg | DELAYED_RELEASE_TABLET | Freq: Once | ORAL | Status: AC
  Administered 2022-05-07: 10 mg via ORAL
  Filled 2022-05-07: qty 2

## 2022-05-07 NOTE — [facility] Group Notes (Signed)
[facility] Group Notes:  (Nursing/MHT/Case Management/Adjunct)  Date:  05/07/2022  Time:  10:34 PM  Type of Therapy:  Psychoeducational Skills  Participation Level:  Active  Participation Quality:  Appropriate  Affect:  Appropriate  Cognitive:  Alert and Appropriate  Insight:  Appropriate and Good  Engagement in Group:  Engaged  Modes of Intervention:  Support  Summary of Progress/Problems: Penny Berger shared with the group how she had a good day due to engaging with others.  She too is looking to return on home on the tomorrow.    [name] [place] 05/07/2022, 10:34 PM

## 2022-05-07 NOTE — Progress Notes (Signed)
   05/07/22 2045  Psych Admission Type (Psych Patients Only)  Admission Status Voluntary  Psychosocial Assessment  Patient Complaints Anxiety  Eye Contact Brief  Facial Expression Anxious  Affect Appropriate to circumstance  Speech Logical/coherent  Interaction Avoidant  Motor Activity Slow  Appearance/Hygiene Unremarkable  Behavior Characteristics Cooperative  Mood Anxious  Aggressive Behavior  Effect No apparent injury  Thought Process  Coherency WDL  Content WDL  Delusions WDL  Perception WDL  Hallucination None reported or observed  Judgment Poor  Confusion None  Danger to Self  Current suicidal ideation? Denies  Danger to Others  Danger to Others None reported or observed

## 2022-05-07 NOTE — Progress Notes (Signed)
   05/07/22 0800  Psych Admission Type (Psych Patients Only)  Admission Status Voluntary  Psychosocial Assessment  Patient Complaints Anxiety  Eye Contact Brief  Facial Expression Anxious  Affect Appropriate to circumstance  Speech Logical/coherent  Interaction Guarded  Motor Activity Slow  Appearance/Hygiene Unremarkable  Behavior Characteristics Cooperative  Mood Anxious  Aggressive Behavior  Type of Behavior Verbal  Effect No apparent injury  Thought Process  Coherency WDL  Content WDL  Delusions WDL  Perception WDL  Hallucination None reported or observed  Judgment Poor  Confusion None  Danger to Self  Current suicidal ideation? Denies  Self-Injurious Behavior No self-injurious ideation or behavior indicators observed or expressed   Agreement Not to Harm Self Yes  Description of Agreement Verbal Contract  Danger to Others  Danger to Others None reported or observed

## 2022-05-07 NOTE — Progress Notes (Signed)
   05/07/22 0530  Sleep  Number of Hours 7.5

## 2022-05-07 NOTE — Group Note (Signed)
Recreation Therapy Group Note   Group Topic:Team Building  Group Date: 05/07/2022 Start Time: 0930 End Time: 0955 Facilitators: Melvie Paglia-McCall, LRT,CTRS Location: [place]   Recreation Therapy Notes  Goal Area(s) Addresses:  Patient will effectively work with peer towards shared goal.  Patient will identify skills used to make activity successful.  Patient will identify how skills used during activity can be applied to reach post d/c goals.   Group Description: [employer]. In teams of 5-6, patients were given 12 craft pipe cleaners. Using the materials provided, patients were instructed to compete again the opposing team(s) to build the tallest free-standing structure from floor level. The activity was timed; difficulty increased by Probation officer as Pharmacist, hospital continued.  Systematically resources were removed with additional directions for example, placing one arm behind their back, working in silence, and shape stipulations. LRT facilitated post-activity discussion reviewing team processes and necessary communication skills involved in completion. Patients were encouraged to reflect how the skills utilized, or not utilized, in this activity can be incorporated to positively impact support systems post discharge.   Affect/Mood: Appropriate   Participation Level: Active   Participation Quality: Independent   Behavior: Appropriate   Speech/Thought Process: Focused   Insight: Good   Judgement: Good   Modes of Intervention: STEM Activity   Patient Response to Interventions:  Engaged   Education Outcome:  Acknowledges education and In group clarification offered    Clinical Observations/Individualized Feedback: Pt was quiet but would give opinions on how to complete activity.  Pt was engaged and worked well with peers to complete activity.     Plan: Continue to engage patient in RT group sessions 2-3x/week.   Amyr Sluder-McCall,  LRT,CTRS 05/07/2022 11:25 AM

## 2022-05-07 NOTE — Progress Notes (Signed)
[facility] MD Progress Note  05/07/2022 8:23 AM RAYSHELL GOECKE  MRN:  [id number] Principal Problem: MDD (major depressive disorder), recurrent severe, without psychosis ([place]) Diagnosis: Principal Problem:   MDD (major depressive disorder), recurrent severe, without psychosis ([place]) Active Problems:   Suicidal behavior with attempted self-injury ([place])   Cocaine abuse, episodic use ([place])   Alcohol use disorder, moderate, dependence ([place])   Marijuana smoker, continuous   Reason for Admission: Penny Berger is a 46 year old female with a PPH of MDD- recurrent/severe/without psychotic features, post-partum depression, GAD, PNES, and chronic suicidality who presented to the [facility] voluntarily from [facility] for SI with a plan to cut her wrist. This is hospitalization day 4.  Subjective:  Patient seen an assessed in dayroom.  Patient denies SI/HI/AVH.  States that she was sleeping and eating more appropriately today.  States that she was "tired of staying in bed" and has been attending more groups.  Patient has found that these group therapies have been beneficial.  Patient is perseverative on discharging tomorrow despite discussing that she would be discharging Friday.  Patient reports feeling continued depression and some anxiety regarding discharge.  Patient also reports somatic complaints of persistent constipation.  He reports she has taken MiraLAX twice but to no effect.  She thinks that the MiraLAX we are giving her is not the right MiraLAX.  She requested Dulcolax to help with her bowel movement which I have ordered.  She denies any other complaints denies acute somatic complaints from current medication regimen.  We discussed that we will be increasing her venlafaxine tomorrow 150 mg and plan for discharge Friday.  Patient stated that she wanted to speak with the "real doctor" about discharge date, stating "I don't want to be here for thanksgiving".  Objective:  Chart Review Past 24 hours of patient's  chart was reviewed.  Patient is compliant with scheduled meds. Required Agitation PRNs: none Per RN notes, no documented behavioral issues and is attending group. Patient slept, undocumented hours  Total Time spent with patient: 45 minutes  Past Psychiatric History:  Previous Psych Diagnoses: GAD, conversion disorder with PNES (2013), MDD, panic attacks. Prior inpatient treatment: Denies Prior outpatient treatment: Denies Psychotherapy hx: [facility] therapist in 2013 History of suicide: 2003- cut wrists History of homicide: Denies Psychiatric medication history: Effexor, Xanax 1 mg BID PRN, Zoloft 100 mg Psychiatric medication compliance history: N/A Neuromodulation history: Denies Current Psychiatrist: Denies Current therapist: Supposed to establish care with [facility], Mrs. Tye Savoy, on Monday  Past Medical History:  Past Medical History:  Diagnosis Date   Seizures ([place])    Stroke ([place])    Syncope     Past Surgical History:  Procedure Laterality Date   APPENDECTOMY     CESAREAN SECTION     Family History: History reviewed. No pertinent family history. Family Psychiatric  History:  Psych: sister- depression  Psych Rx: Unknown  Social History:  Social History   Substance and Sexual Activity  Alcohol Use Yes     Social History   Substance and Sexual Activity  Drug Use Yes   Types: Cocaine, Marijuana    Social History   Socioeconomic History   Marital status: Single    Spouse name: Not on file   Number of children: Not on file   Years of education: Not on file   Highest education level: Not on file  Occupational History   Not on file  Tobacco Use   Smoking status: Every Day  Packs/day: 0.50    Types: Cigarettes   Smokeless tobacco: Never  Vaping Use   Vaping Use: Never used  Substance and Sexual Activity   Alcohol use: Yes   Drug use: Yes    Types: Cocaine, Marijuana   Sexual activity: Yes  Other Topics Concern    Not on file  Social History Narrative   Not on file   Social Determinants of Health   Financial Resource Strain: Not on file  Food Insecurity: Not on file  Transportation Needs: Not on file  Physical Activity: Not on file  Stress: Not on file  Social Connections: Not on file   Additional Social History:                         Current Medications: Current Facility-Administered Medications  Medication Dose Route Frequency Provider Last Rate Last Admin   acetaminophen (TYLENOL) tablet 650 mg  650 mg Oral Q6H PRN Bobbitt, Shalon E, NP       alum & mag hydroxide-simeth (MAALOX/MYLANTA) 200-200-20 MG/5ML suspension 30 mL  30 mL Oral Q4H PRN Bobbitt, Shalon E, NP       ARIPiprazole (ABILIFY) tablet 2 mg  2 mg Oral Daily France Ravens, MD   2 mg at 05/07/22 0800   bisacodyl (DULCOLAX) EC tablet 10 mg  10 mg Oral Once France Ravens, MD       hydrOXYzine (ATARAX) tablet 25 mg  25 mg Oral TID PRN Rosezetta Schlatter, MD   25 mg at 05/06/22 2105   magnesium hydroxide (MILK OF MAGNESIA) suspension 30 mL  30 mL Oral Daily PRN Bobbitt, Shalon E, NP       nicotine (NICODERM CQ - dosed in mg/24 hours) patch 14 mg  14 mg Transdermal Q0600 Bobbitt, Shalon E, NP   14 mg at 05/07/22 0801   polyethylene glycol (MIRALAX / GLYCOLAX) packet 17 g  17 g Oral Daily PRN Rosezetta Schlatter, MD   17 g at 05/06/22 1519   traZODone (DESYREL) tablet 50 mg  50 mg Oral QHS PRN Bobbitt, Shalon E, NP   50 mg at 05/06/22 2105   venlafaxine XR (EFFEXOR-XR) 24 hr capsule 112.5 mg  112.5 mg Oral Q breakfast France Ravens, MD   112.5 mg at 05/07/22 0800    Lab Results:  No results found for this or any previous visit (from the past 24 hour(s)).  Blood Alcohol level:  Lab Results  Component Value Date   ETH <10 24/40/1027    Metabolic Disorder Labs: Lab Results  Component Value Date   HGBA1C 5.7 (H) 05/02/2022   MPG 116.89 05/02/2022   Lab Results  Component Value Date   PROLACTIN 5.0 05/02/2022   Lab Results   Component Value Date   CHOL 215 (H) 05/02/2022   TRIG 90 05/02/2022   HDL 43 05/02/2022   CHOLHDL 5.0 05/02/2022   VLDL 18 05/02/2022   LDLCALC 154 (H) 05/02/2022    Physical Findings:  Musculoskeletal: Strength & Muscle Tone: within normal limits Gait & Station: normal  Psychiatric Specialty Exam:  Presentation  General Appearance:  Casual   Eye Contact: Minimal   Speech: Clear and Coherent; Normal Rate   Speech Volume: Normal   Handedness: Right    Mood and Affect  Mood: Anxious; Depressed   Affect: Constricted    Thought Process  Thought Processes: Coherent; Goal Directed   Descriptions of Associations:Intact   Orientation:Full (Time, Place and Person)   Thought Content:Logical  History of Schizophrenia/Schizoaffective disorder:  Hallucinations:Hallucinations: None    Ideas of Reference:None   Suicidal Thoughts:Suicidal Thoughts: No    Homicidal Thoughts:Homicidal Thoughts: No     Sensorium  Memory: Immediate Good; Recent Good; Remote Good   Judgment: Poor   Insight: Shallow    Executive Functions  Concentration: Good   Attention Span: Good   Recall: Good   Fund of Knowledge: Good   Language: Good    Psychomotor Activity  Psychomotor Activity: Psychomotor Activity: Normal     Assets  Assets: Communication Skills; Financial Resources/Insurance; Housing; Physical Health; Social Support    Sleep  Sleep: Sleep: Good      Physical Exam: Review of Systems  Respiratory:  Negative for shortness of breath.   Cardiovascular:  Negative for chest pain.  Gastrointestinal:  Negative for abdominal pain, constipation, diarrhea, heartburn, nausea and vomiting.  Neurological:  Negative for headaches.   Blood pressure 121/86, pulse 98, temperature 98.7 F (37.1 C), temperature source Oral, resp. rate 14, height 5' 4.5" (1.638 m), weight 74.8 kg, SpO2 98 %. Body mass index is 27.87  kg/m.   ASSESSMENT AND PLAN  Patient is a 46 year old female with a PPH of MDD- recurrent/severe/without psychotic features, post-partum depression, GAD, PNES, and chronic suicidality who presented to the [facility] voluntarily from [facility] for [place] with a plan to cut her wrist.   This is hospitalization day 4..  Patient is perseverative regarding discharge on Thanksgiving day.  She became dysphoric when discussing this during assessment that she would likely be discharged Friday.  She is insistent that she had 1 good day therefore she is ready for discharge.  Patient does appear to have improvement in depression and anxiety but she still appears anxious and depressed. Encouraged patient to continue attending groups so that she can obtain more coping skills so that she is better prepared. We will further titrate her medications tomorrow.  PLAN Safety and Monitoring: Voluntary admission to inpatient psychiatric unit for safety, stabilization and treatment Daily contact with patient to assess and evaluate symptoms and progress in treatment Patient's case to be discussed in multi-disciplinary team meeting Observation Level : q15 minute checks Vital signs: q12 hours Precautions: suicide, elopement, and assault  #MDD- recurrent/severe/without psychotic features #Acute on Chronic Suicidal Ideation #Stimulant Use Disorder #Cannabis Use Disorder #Nicotine Use Disorder -- Continue Effexor 112.5 mg today then INCREASE 150 mg on Thursday  for refractory depressive symptoms --Continue abilify 2 mg daily for adjunctive therapy for severe depression  -Qtc 422, A1c 5.7, lipid panel shows hypercholesterolemia but was not fasting -- Discontinued Lithium due to side effects -- NRT   PRN: Hydroxyzine 25 mg TID for anxiety, Miralax   -- The risks/benefits/side-effects/alternatives to this medication were discussed in detail with the patient and time was given for questions. The patient consents to medication trial.               -- Metabolic profile and EKG monitoring obtained while on an atypical antipsychotic  BMI: 27.87 TSH: 0.831 Lipid Panel: Total cholesterol 215, LDL 154 HbgA1c: 5.7% QTc: 422             -- Encouraged patient to participate in unit milieu and in scheduled group therapies      3. Medical Issues Being Addressed:   #Pre-Diabetes -- Discussed lifestyle changes and need for PCP follow-up   #Hyperlipidemia -- Discussed lifestyle changes and need for PCP follow-up   #UTI, treated -Completed macrobid course 05/04/22   #Chronic Constipation --  Miralax available daily PRN -- Dulcolax 10 mg once   4. Discharge Planning: Social work and case management to assist with discharge planning and identification of hospital follow-up needs prior to discharge Estimated LOS: 5-7 days Discharge Concerns: Need to establish a safety plan; Medication compliance and effectiveness Discharge Goals: Return home with outpatient referrals for mental health follow-up including medication management/psychotherapy     France Ravens, MD 05/07/2022, 8:23 AM

## 2022-05-07 NOTE — Progress Notes (Signed)
Adult Psychoeducational Group Note  Date:  05/07/2022 Time:  9:15 AM  Group Topic/Focus:  Orientation:   The focus of this group is to educate the patient on the purpose and policies of crisis stabilization and provide a format to answer questions about their admission.  The group details unit policies and expectations of patients while admitted.  Participation Level:  Active  Participation Quality:  Appropriate  Affect:  Appropriate  Cognitive:  Appropriate  Insight: Appropriate  Engagement in Group:  Engaged  Modes of Intervention:  Discussion  Additional Comments:  Pt attended the orientation group and remained appropriate and engaged throughout the duration of the group.   Sandi Mariscal O 05/07/2022, 9:15 AM

## 2022-05-08 DIAGNOSIS — F332 Major depressive disorder, recurrent severe without psychotic features: Secondary | ICD-10-CM | POA: Diagnosis not present

## 2022-05-08 MED ORDER — ARIPIPRAZOLE 2 MG PO TABS: 2.0000 mg | ORAL_TABLET | Freq: Every day | ORAL | 0 refills | Status: DC

## 2022-05-08 MED ORDER — POLYETHYLENE GLYCOL 3350 17 G PO PACK: 17.0000 g | PACK | Freq: Every day | ORAL | 0 refills | Status: DC | PRN

## 2022-05-08 MED ORDER — NICOTINE 14 MG/24HR TD PT24: 14.0000 mg | MEDICATED_PATCH | Freq: Every day | TRANSDERMAL | 0 refills | Status: DC

## 2022-05-08 MED ORDER — HYDROXYZINE HCL 25 MG PO TABS: 25.0000 mg | ORAL_TABLET | Freq: Three times a day (TID) | ORAL | 0 refills | Status: DC | PRN

## 2022-05-08 MED ORDER — TRAZODONE HCL 50 MG PO TABS: 50.0000 mg | ORAL_TABLET | Freq: Every evening | ORAL | 0 refills | Status: DC | PRN

## 2022-05-08 MED ORDER — VENLAFAXINE HCL ER 150 MG PO CP24: 150.0000 mg | ORAL_CAPSULE | Freq: Every day | ORAL | 0 refills | Status: DC

## 2022-05-08 MED ORDER — BISACODYL 5 MG PO TBEC: 5.0000 mg | DELAYED_RELEASE_TABLET | Freq: Every day | ORAL | 0 refills | Status: DC

## 2022-05-08 NOTE — BHH Suicide Risk Assessment (Addendum)
[facility] Discharge Suicide Risk Assessment   Principal Problem: MDD (major depressive disorder), recurrent severe, without psychosis ([place]) Discharge Diagnoses: Principal Problem:   MDD (major depressive disorder), recurrent severe, without psychosis ([place]) Active Problems:   Suicidal behavior with attempted self-injury ([place])   Cocaine abuse, episodic use ([place])   Alcohol use disorder, moderate, dependence ([place])   Marijuana smoker, continuous   Reason for Admission: Penny Berger is a 46 year old female with a PPH of MDD- recurrent/severe/without psychotic features, post-partum depression, GAD, PNES, and chronic suicidality who presented to the [facility] voluntarily from [facility] for [place] with a plan to cut her wrist.    Hospital Summary During the patient's hospitalization, patient had extensive initial psychiatric evaluation, and follow-up psychiatric evaluations every day.  Psychiatric diagnoses provided upon initial assessment:  #MDD- recurrent/severe/without psychotic features #Acute on Chronic Suicidal Ideation #Stimulant Use Disorder #Cannabis Use Disorder #Nicotine Use Disorder  Patient's psychiatric medications were adjusted on admission:  -- Start Effexor 75 mg daily, with plan to increase to 150 mg after a couple of days.  -- Start Lithium carbonate 300 mg BID.  During the hospitalization, other adjustments were made to the patient's psychiatric medication regimen:  -Stopped lithium due to side effects -Started abilify 2 mg for adjunct to depression -Increased Effexor to 150 mg daily for depressive symptoms  Gradually, patient started adjusting to milieu.   Patient's care was discussed during the interdisciplinary team meeting every day during the hospitalization.  The patient denies having side effects to prescribed psychiatric medication.  The patient reports their target psychiatric symptoms of depression and anxiety responded well to the psychiatric medications, and the patient  reports overall benefit other psychiatric hospitalization. Supportive psychotherapy was provided to the patient. The patient also participated in regular group therapy while admitted.   Labs were reviewed with the patient, and abnormal results were discussed with the patient.  The patient denied having suicidal thoughts more than 48 hours prior to discharge.  Patient denies having homicidal thoughts.  Patient denies having auditory hallucinations.  Patient denies any visual hallucinations.  Patient denies having paranoid thoughts.  The patient is able to verbalize their individual safety plan to this provider.  It is recommended to the patient to continue psychiatric medications as prescribed, after discharge from the hospital.    It is recommended to the patient to follow up with your outpatient psychiatric provider and PCP.  Discussed with the patient, the impact of alcohol, drugs, tobacco have been there overall psychiatric and medical wellbeing, and total abstinence from substance use was recommended the patient.   Total Time spent with patient: 45 minutes  Musculoskeletal: Strength & Muscle Tone: within normal limits Gait & Station: normal Patient leans: N/A  Psychiatric Specialty Exam  Presentation  General Appearance: Casual   Eye Contact:Minimal   Speech:Clear and Coherent; Normal Rate   Speech Volume:Normal   Handedness:Right    Mood and Affect  Mood:Anxious; Depressed   Duration of Depression Symptoms: Greater than two weeks   Affect:Constricted    Thought Process  Thought Processes:Coherent; Goal Directed   Descriptions of Associations:Intact   Orientation:Full (Time, Place and Person)   Thought Content:Logical   History of Schizophrenia/Schizoaffective disorder:No   Duration of Psychotic Symptoms:No data recorded  Hallucinations:Hallucinations: None  Ideas of Reference:None   Suicidal Thoughts:Suicidal Thoughts: No  Homicidal  Thoughts:Homicidal Thoughts: No   Sensorium  Memory:Immediate Good; Recent Good; Remote Good   Judgment:Poor   Insight:Shallow    Executive Functions  Concentration:Good  Attention Span:Good   Recall:Good   Fund of Knowledge:Good   Language:Good    Psychomotor Activity  Psychomotor Activity:Psychomotor Activity: Normal   Assets  Assets:Communication Skills; Financial Resources/Insurance; Housing; Physical Health; Social Support    Sleep  Sleep:Sleep: Good   Physical Exam: Physical Exam Vitals and nursing note reviewed.  Constitutional:      Appearance: Normal appearance. She is normal weight.  HENT:     Head: Normocephalic and atraumatic.  Pulmonary:     Effort: Pulmonary effort is normal.  Neurological:     General: No focal deficit present.     Mental Status: She is oriented to person, place, and time.    Review of Systems  Respiratory:  Negative for shortness of breath.   Cardiovascular:  Negative for chest pain.  Gastrointestinal:  Negative for abdominal pain, constipation, diarrhea, heartburn, nausea and vomiting.  Neurological:  Negative for headaches.   Blood pressure (!) 110/95, pulse (!) 107, temperature 98.8 F (37.1 C), temperature source Oral, resp. rate 14, height 5' 4.5" (1.638 m), weight 74.8 kg, SpO2 99 %. Body mass index is 27.87 kg/m.  Mental Status Per Nursing Assessment::   On Admission:  NA  Demographic Factors:  NA  Loss Factors: Financial problems/change in socioeconomic status  Historical Factors: Family history of mental illness or substance abuse and Impulsivity  Risk Reduction Factors:   Sense of responsibility to family, Employed, Living with another person, especially a relative, Positive social support, Positive therapeutic relationship, and Positive coping skills or problem solving skills  Continued Clinical Symptoms:  Severe Anxiety and/or Agitation Depression:   Anhedonia Dysthymia More than one  psychiatric diagnosis  Cognitive Features That Contribute To Risk:  None    Suicide Risk:  Mild:  There are no identifiable plans, no associated intent, mild dysphoria and related symptoms, good self-control (both objective and subjective assessment), few other risk factors, and identifiable protective factors, including available and accessible social support.   Follow-up [facility] Follow up on 05/26/2022.   Specialty: Behavioral Health Why: You have an appointment for medication management services on 05/26/22 at 9 am with Dr. De Nurse.  You also have an appointment on 05/2022 at 10 am with [facility].  These are Virtual Visits for both appointments but you may switch to in person in the future.   The provider  will text you the link . Contact information: [place] [phone]                Plan Of Care/Follow-up recommendations:  Activity: as tolerated  Diet: heart healthy  Other: -Follow-up with your outpatient psychiatric provider -instructions on appointment date, time, and address (location) are provided to you in discharge paperwork.  -Take your psychiatric medications as prescribed at discharge - instructions are provided to you in the discharge paperwork  -Follow-up with outpatient primary care doctor and other specialists -for management of chronic medical disease, including: none  -Testing: Follow-up with outpatient provider for abnormal lab results: none  -Recommend abstinence from alcohol, tobacco, and other illicit drug use at discharge.   -If your psychiatric symptoms recur, worsen, or if you have side effects to your psychiatric medications, call your outpatient psychiatric provider, 911, [phone] or go to the nearest emergency department.  -If suicidal thoughts recur, call your outpatient psychiatric provider, 911, [phone] or go to the nearest emergency  department.   France Ravens, MD 05/08/2022, 9:16  AM  Patient seen independently and chart reviewed, agree to the note and patient stable for discharge, mood is stable not suicidal.  Insight improved, reviewed medications and collaborated care . Patient to follow up with recommendations /appointments and medications

## 2022-05-08 NOTE — Progress Notes (Signed)
Keturah Shavers D/C'd Home per MD order.  Discussed with the patient and all questions fully answered.  An After Visit Summary was printed and given to the patient. Patient received prescription.  D/c education completed with patient including follow up instructions, medication list, d/c activities limitations if indicated, with other d/c instructions as indicated by MD - patient able to verbalize understanding, all questions fully answered.   Patient instructed to return to ED, call 911, or call MD for any changes in condition.   Patient escorted to the main entrance and D/C home via private auto.  Dorris Carnes 05/08/2022 12:19 PM

## 2022-05-08 NOTE — Plan of Care (Signed)
?  Problem: Activity: ?Goal: Interest or engagement in activities will improve ?Outcome: Progressing ?Goal: Sleeping patterns will improve ?Outcome: Progressing ?  ?Problem: Coping: ?Goal: Ability to verbalize frustrations and anger appropriately will improve ?Outcome: Progressing ?Goal: Ability to demonstrate self-control will improve ?Outcome: Progressing ?  ?Problem: Safety: ?Goal: Periods of time without injury will increase ?Outcome: Progressing ?  ?

## 2022-05-08 NOTE — Progress Notes (Signed)
Pt has brighter affect, pt stated she feels a lot better "you guys really saved my life, thank you"

## 2022-05-08 NOTE — Progress Notes (Signed)
   05/08/22 0515  Sleep  Number of Hours 5.5    

## 2022-05-08 NOTE — Discharge Summary (Addendum)
Physician Discharge Summary Note  Patient:  Penny Berger is an 46 y.o., female MRN:  [id number] DOB:  1976-03-24 Patient phone:  [phone] (home)  Patient address:   [address] [place] [address],  Total Time spent with patient: 45 minutes  Date of Admission:  05/03/2022 Date of Discharge: 05/08/2022  Reason for Admission:  Penny Berger is a 46 year old female with a PPH of MDD- recurrent/severe/without psychotic features, post-partum depression, GAD, PNES, and chronic suicidality who presented to the [facility] voluntarily from [facility] for [place] with a plan to cut her wrist.   Principal Problem: MDD (major depressive disorder), recurrent severe, without psychosis ([place]) Discharge Diagnoses: Principal Problem:   MDD (major depressive disorder), recurrent severe, without psychosis ([place]) Active Problems:   Suicidal behavior with attempted self-injury ([place])   Cocaine abuse, episodic use ([place])   Alcohol use disorder, moderate, dependence ([place])   Marijuana smoker, continuous    Past Psychiatric History:  Previous Psych Diagnoses: GAD, conversion disorder with PNES (2013), MDD, panic attacks. Prior inpatient treatment: Denies Prior outpatient treatment: Denies Psychotherapy hx: [facility] therapist in 2013 History of suicide: 2003- cut wrists History of homicide: Denies Psychiatric medication history: Effexor, Xanax 1 mg BID PRN, Zoloft 100 mg Psychiatric medication compliance history: N/A Neuromodulation history: Denies Current Psychiatrist: Denies Current therapist: Supposed to establish care with [facility], Mrs. Tye Savoy, on Monday  Past Medical History:  Past Medical History:  Diagnosis Date   Seizures ([place])    Stroke ([place])    Syncope     Past Surgical History:  Procedure Laterality Date   APPENDECTOMY     CESAREAN SECTION     Family History: History reviewed. No pertinent family history. Family Psychiatric  History:  Psych:  sister- depression  Psych Rx: Unknown  Social History:  Social History   Substance and Sexual Activity  Alcohol Use Yes     Social History   Substance and Sexual Activity  Drug Use Yes   Types: Cocaine, Marijuana    Social History   Socioeconomic History   Marital status: Single    Spouse name: Not on file   Number of children: Not on file   Years of education: Not on file   Highest education level: Not on file  Occupational History   Not on file  Tobacco Use   Smoking status: Every Day    Packs/day: 0.50    Types: Cigarettes   Smokeless tobacco: Never  Vaping Use   Vaping Use: Never used  Substance and Sexual Activity   Alcohol use: Yes   Drug use: Yes    Types: Cocaine, Marijuana   Sexual activity: Yes  Other Topics Concern   Not on file  Social History Narrative   Not on file   Social Determinants of Health   Financial Resource Strain: Not on file  Food Insecurity: Not on file  Transportation Needs: Not on file  Physical Activity: Not on file  Stress: Not on file  Social Connections: Not on file    Hospital Course:   During the patient's hospitalization, patient had extensive initial psychiatric evaluation, and follow-up psychiatric evaluations every day.   Psychiatric diagnoses provided upon initial assessment:  #MDD- recurrent/severe/without psychotic features #Acute on Chronic Suicidal Ideation #Stimulant Use Disorder #Cannabis Use Disorder #Nicotine Use Disorder   Patient's psychiatric medications were adjusted on admission:  -- Start Effexor 75 mg daily, with plan to increase to 150 mg after a couple of days.  --  Start Lithium carbonate 300 mg BID.   During the hospitalization, other adjustments were made to the patient's psychiatric medication regimen:  -Stopped lithium due to side effects -Started abilify 2 mg for adjunct to depression -Increased Effexor to 150 mg daily for depressive symptoms   Gradually, patient started adjusting to  milieu.   Patient's care was discussed during the interdisciplinary team meeting every day during the hospitalization.   The patient denies having side effects to prescribed psychiatric medication.   The patient reports their target psychiatric symptoms of depression and anxiety responded well to the psychiatric medications, and the patient reports overall benefit other psychiatric hospitalization. Supportive psychotherapy was provided to the patient. The patient also participated in regular group therapy while admitted.    Labs were reviewed with the patient, and abnormal results were discussed with the patient.   The patient denied having suicidal thoughts more than 48 hours prior to discharge.  Patient denies having homicidal thoughts.  Patient denies having auditory hallucinations.  Patient denies any visual hallucinations.  Patient denies having paranoid thoughts.   The patient is able to verbalize their individual safety plan to this provider.   It is recommended to the patient to continue psychiatric medications as prescribed, after discharge from the hospital.     It is recommended to the patient to follow up with your outpatient psychiatric provider and PCP.   Discussed with the patient, the impact of alcohol, drugs, tobacco have been there overall psychiatric and medical wellbeing, and total abstinence from substance use was recommended the patient.  Physical Findings: AIMS: 0  Musculoskeletal: Strength & Muscle Tone: within normal limits Gait & Station: normal Patient leans: N/A   Psychiatric Specialty Exam:  Presentation  General Appearance:  Casual   Eye Contact: Minimal   Speech: Clear and Coherent; Normal Rate   Speech Volume: Normal   Handedness: Right    Mood and Affect  Mood: Anxious; Depressed   Affect: Constricted    Thought Process  Thought Processes: Coherent; Goal Directed   Descriptions of  Associations:Intact   Orientation:Full (Time, Place and Person)   Thought Content:Logical   History of Schizophrenia/Schizoaffective disorder:No   Duration of Psychotic Symptoms:No data recorded  Hallucinations:Hallucinations: None   Ideas of Reference:None   Suicidal Thoughts:Suicidal Thoughts: No   Homicidal Thoughts:Homicidal Thoughts: No    Sensorium  Memory: Immediate Good; Recent Good; Remote Good   Judgment: Poor   Insight: Shallow    Executive Functions  Concentration: Good   Attention Span: Good   Recall: Good   Fund of Knowledge: Good   Language: Good    Psychomotor Activity  Psychomotor Activity: Psychomotor Activity: Normal    Assets  Assets: Communication Skills; Financial Resources/Insurance; Housing; Physical Health; Social Support    Sleep  Sleep: Sleep: Good     Physical Exam: Physical Exam Vitals and nursing note reviewed.  Constitutional:      Appearance: Normal appearance. She is normal weight.  HENT:     Head: Normocephalic and atraumatic.  Pulmonary:     Effort: Pulmonary effort is normal.  Neurological:     General: No focal deficit present.     Mental Status: She is oriented to person, place, and time.    Review of Systems  Respiratory:  Negative for shortness of breath.   Cardiovascular:  Negative for chest pain.  Gastrointestinal:  Negative for abdominal pain, constipation, diarrhea, heartburn, nausea and vomiting.  Neurological:  Negative for headaches.   Blood pressure (!) 110/95, pulse Marland Kitchen)  107, temperature 98.8 F (37.1 C), temperature source Oral, resp. rate 14, height 5' 4.5" (1.638 m), weight 74.8 kg, SpO2 99 %. Body mass index is 27.87 kg/m.   Social History   Tobacco Use  Smoking Status Every Day   Packs/day: 0.50   Types: Cigarettes  Smokeless Tobacco Never   Tobacco Cessation:  N/A, patient does not currently use tobacco products   Blood Alcohol level:  Lab  Results  Component Value Date   ETH <10 39/76/7341    Metabolic Disorder Labs:  Lab Results  Component Value Date   HGBA1C 5.7 (H) 05/02/2022   MPG 116.89 05/02/2022   Lab Results  Component Value Date   PROLACTIN 5.0 05/02/2022   Lab Results  Component Value Date   CHOL 215 (H) 05/02/2022   TRIG 90 05/02/2022   HDL 43 05/02/2022   CHOLHDL 5.0 05/02/2022   VLDL 18 05/02/2022   LDLCALC 154 (H) 05/02/2022    See Psychiatric Specialty Exam and Suicide Risk Assessment completed by Attending Physician prior to discharge.  Discharge destination:  Home  Is patient on multiple antipsychotic therapies at discharge:  No   Has Patient had three or more failed trials of antipsychotic monotherapy by history:  No  Recommended Plan for Multiple Antipsychotic Therapies: NA  Discharge Instructions     Diet - low sodium heart healthy   Complete by: As directed    Discharge instructions   Complete by: As directed    Take all medications as prescribed by his/her mental healthcare provider. Report any adverse effects and or reactions from the medicines to your outpatient provider promptly. Do not engage in alcohol and or illegal drug use while on prescription medicines. In the event of worsening symptoms, call the crisis hotline, 911 and or go to the nearest ED for appropriate evaluation and treatment of symptoms. follow-up with your primary care provider for your other medical issues, concerns and or health care needs.   Increase activity slowly   Complete by: As directed       Allergies as of 05/08/2022   No Known Allergies      Medication List     STOP taking these medications    nitrofurantoin (macrocrystal-monohydrate) 100 MG capsule Commonly known as: MACROBID       TAKE these medications      Indication  ARIPiprazole 2 MG tablet Commonly known as: ABILIFY Take 1 tablet (2 mg total) by mouth daily. Start taking on: May 09, 2022  Indication: Major  Depressive Disorder   bisacodyl 5 MG EC tablet Commonly known as: DULCOLAX Take 1 tablet (5 mg total) by mouth daily. Start taking on: May 09, 2022  Indication: Constipation   hydrOXYzine 25 MG tablet Commonly known as: ATARAX Take 1 tablet (25 mg total) by mouth 3 (three) times daily as needed for anxiety.  Indication: Feeling Anxious   Lumify 0.025 % Soln Generic drug: Brimonidine Tartrate Place 2 drops into both eyes daily.  Indication: Red Eyes   medroxyPROGESTERone 150 MG/ML injection Commonly known as: DEPO-PROVERA Inject 150 mg into the muscle every 3 (three) months.  Indication: Birth Control Treatment   nicotine 14 mg/24hr patch Commonly known as: NICODERM CQ - dosed in mg/24 hours Place 1 patch (14 mg total) onto the skin daily at 6 (six) AM. Start taking on: May 09, 2022  Indication: Nicotine Addiction   polyethylene glycol 17 g packet Commonly known as: MIRALAX / GLYCOLAX Take 17 g by mouth daily as needed for  mild constipation or moderate constipation.  Indication: Constipation   traZODone 50 MG tablet Commonly known as: DESYREL Take 1 tablet (50 mg total) by mouth at bedtime as needed for sleep.  Indication: Trouble Sleeping   venlafaxine XR 150 MG 24 hr capsule Commonly known as: EFFEXOR-XR Take 1 capsule (150 mg total) by mouth daily with breakfast. Start taking on: May 09, 2022  Indication: Major Depressive Disorder        Follow-up [facility] Follow up on 05/26/2022.   Specialty: Behavioral Health Why: You have an appointment for medication management services on 05/26/22 at 9 am with Dr. De Nurse.  You also have an appointment on 05/2022 at 10 am with [facility].  These are Virtual Visits for both appointments but you may switch to in person in the future.   The provider  will text you the link . Contact information: [place] [phone]                 Follow-up recommendations:   Activity:  as tolerated Diet:  heart healthy   Comments:  Prescriptions were given at discharge.  Patient is agreeable with the discharge plan.  Patient was given an opportunity to ask questions.  Patient appears to feel comfortable with discharge and denies any current suicidal or homicidal thoughts.    Patient is instructed prior to discharge to: Take all medications as prescribed by mental healthcare provider. Report any adverse effects and or reactions from the medicines to outpatient provider promptly. In the event of worsening symptoms, patient is instructed to call the crisis hotline, 911 and or go to the nearest ED for appropriate evaluation and treatment of symptoms. Patient is to follow-up with primary care provider for other medical issues, concerns and or health care needs.   Signed: France Ravens, MD 05/08/2022, 9:21 AM   Patient seen and chart reviewed, have spent face to face time and notes reviewed. Patient is stable with depression improved, discussed to take medications as proescribed and follow up with appointments. Time spent in collaboration of care and documentation.  Depression is stable and insight improved. Will be discharged as planned and agree with recommendations

## 2022-05-08 NOTE — Progress Notes (Signed)
  [facility] Adult Case Management Discharge Plan :  Will you be returning to the same living situation after discharge:   - mother Mattie Marlin, cell [phone] At discharge, do you have transportation home?: Yes,  Mother Do you have the ability to pay for your medications: Yes,  Insured  Release of information consent forms completed and in the chart;  Patient's signature needed at discharge.  Patient to Follow up at:  Follow-up [facility] Follow up on 05/26/2022.   Specialty: Behavioral Health Why: You have an appointment for medication management services on 05/26/22 at 9 am with Dr. De Nurse.  You also have an appointment on 05/2022 at 10 am with [facility].  These are Virtual Visits for both appointments but you may switch to in person in the future.   The provider  will text you the link . Contact information: [place]  [address] [place] [phone]                Next level of care provider has access to [place] and Suicide Prevention discussed: Yes,  - mother Mattie Marlin, cell [phone]     Has patient been referred to the Quitline?: Patient refused referral  Patient has been referred for addiction treatment: Yes While here, [place] can benefit from crisis stabilization, medication management, therapeutic milieu, and referrals for services.   Laith Antonelli S Veleka Djordjevic, LCSW 05/08/2022, 10:05 AM

## 2022-05-26 ENCOUNTER — Telehealth (HOSPITAL_COMMUNITY): Payer: PRIVATE HEALTH INSURANCE | Admitting: Psychiatry

## 2022-06-04 ENCOUNTER — Encounter (HOSPITAL_COMMUNITY): Payer: Self-pay

## 2022-06-04 ENCOUNTER — Ambulatory Visit (HOSPITAL_COMMUNITY): Payer: PRIVATE HEALTH INSURANCE | Admitting: Licensed Clinical Social Worker

## 2022-06-04 NOTE — Progress Notes (Signed)
Therapist contacted patient through My Chart and she did not respond.

## 2022-06-15 ENCOUNTER — Other Ambulatory Visit: Payer: Self-pay

## 2022-06-15 ENCOUNTER — Encounter (HOSPITAL_BASED_OUTPATIENT_CLINIC_OR_DEPARTMENT_OTHER): Payer: Self-pay | Admitting: Emergency Medicine

## 2022-06-15 ENCOUNTER — Emergency Department (HOSPITAL_BASED_OUTPATIENT_CLINIC_OR_DEPARTMENT_OTHER)
Admission: EM | Admit: 2022-06-15 | Discharge: 2022-06-15 | Discharge status: Against Medical Advice | Payer: PRIVATE HEALTH INSURANCE | Attending: Emergency Medicine | Admitting: Emergency Medicine

## 2022-06-15 ENCOUNTER — Emergency Department (HOSPITAL_BASED_OUTPATIENT_CLINIC_OR_DEPARTMENT_OTHER): Payer: PRIVATE HEALTH INSURANCE

## 2022-06-15 DIAGNOSIS — Z5321 Procedure and treatment not carried out due to patient leaving prior to being seen by health care provider: Secondary | ICD-10-CM | POA: Insufficient documentation

## 2022-06-15 DIAGNOSIS — R109 Unspecified abdominal pain: Secondary | ICD-10-CM | POA: Insufficient documentation

## 2022-06-15 LAB — CBC WITH DIFFERENTIAL/PLATELET
Abs Immature Granulocytes: 0.01 10*3/uL (ref 0.00–0.07)
Basophils Absolute: 0.1 10*3/uL (ref 0.0–0.1)
Basophils Relative: 2 %
Eosinophils Absolute: 0 10*3/uL (ref 0.0–0.5)
Eosinophils Relative: 1 %
HCT: 43.1 % (ref 36.0–46.0)
Hemoglobin: 14.7 g/dL (ref 12.0–15.0)
Immature Granulocytes: 0 %
Lymphocytes Relative: 28 %
Lymphs Abs: 1 10*3/uL (ref 0.7–4.0)
MCH: 30.5 pg (ref 26.0–34.0)
MCHC: 34.1 g/dL (ref 30.0–36.0)
MCV: 89.4 fL (ref 80.0–100.0)
Monocytes Absolute: 0.8 10*3/uL (ref 0.1–1.0)
Monocytes Relative: 21 %
Neutro Abs: 1.8 10*3/uL (ref 1.7–7.7)
Neutrophils Relative %: 48 %
Platelets: 429 10*3/uL — ABNORMAL HIGH (ref 150–400)
RBC: 4.82 MIL/uL (ref 3.87–5.11)
RDW: 14.8 % (ref 11.5–15.5)
WBC: 3.7 10*3/uL — ABNORMAL LOW (ref 4.0–10.5)
nRBC: 0 % (ref 0.0–0.2)

## 2022-06-15 LAB — COMPREHENSIVE METABOLIC PANEL
ALT: 10 U/L (ref 0–44)
AST: 15 U/L (ref 15–41)
Albumin: 4.5 g/dL (ref 3.5–5.0)
Alkaline Phosphatase: 61 U/L (ref 38–126)
Anion gap: 11 (ref 5–15)
BUN: 10 mg/dL (ref 6–20)
CO2: 21 mmol/L — ABNORMAL LOW (ref 22–32)
Calcium: 9.5 mg/dL (ref 8.9–10.3)
Chloride: 104 mmol/L (ref 98–111)
Creatinine, Ser: 0.64 mg/dL (ref 0.44–1.00)
GFR, Estimated: >60 mL/min (ref >60)
Glucose, Bld: 91 mg/dL (ref 70–99)
Potassium: 4.2 mmol/L (ref 3.5–5.1)
Sodium: 136 mmol/L (ref 135–145)
Total Bilirubin: 0.4 mg/dL (ref 0.3–1.2)
Total Protein: 7.7 g/dL (ref 6.5–8.1)

## 2022-06-15 LAB — URINALYSIS, ROUTINE W REFLEX MICROSCOPIC
Bilirubin Urine: NEGATIVE
Glucose, UA: NEGATIVE mg/dL
Hgb urine dipstick: NEGATIVE
Ketones, ur: NEGATIVE mg/dL
Leukocytes,Ua: NEGATIVE
Nitrite: NEGATIVE
Protein, ur: NEGATIVE mg/dL
Specific Gravity, Urine: 1.02 (ref 1.005–1.030)
pH: 6.5 (ref 5.0–8.0)

## 2022-06-15 LAB — LIPASE, BLOOD: Lipase: 33 U/L (ref 11–51)

## 2022-06-15 LAB — PREGNANCY, URINE: Preg Test, Ur: NEGATIVE

## 2022-06-15 MED ORDER — KETOROLAC TROMETHAMINE 30 MG/ML IJ SOLN
30.0000 mg | Freq: Once | INTRAMUSCULAR | Status: AC
  Administered 2022-06-15: 30 mg via INTRAVENOUS
  Filled 2022-06-15: qty 1

## 2022-06-15 NOTE — ED Notes (Signed)
Pt requested IV be removed.  Pt states that she can no longer wait.  She states that her pain resolved after medication was given.  Delay explained to pt.  Pt requests to leave and requests note that she was here.  Attempted to encourage pt to stay as EDP has signed up for her and will see her shortly.  Pt states that she does not want to stay and is not open to any other tests.  Pt left ambulatory, discussed with her importance to come back if she is not feeling well

## 2022-06-15 NOTE — ED Triage Notes (Signed)
Left side flank pain for 3 days, "feels like kidney stone" "had them before"

## 2022-06-15 NOTE — ED Notes (Signed)
Called lab to add CBC to blood previously sent

## 2022-06-17 NOTE — ED Provider Notes (Signed)
Presented with flank pain, left without being seen. CT reviewed and shows cecum on redundant mesentery but no evidence of volvulus or inflammation. Per nursing she was feeling improved and did not want to waint for evaluation   Gareth Morgan, MD 06/17/22 1439

## 2022-06-26 ENCOUNTER — Ambulatory Visit: Payer: Self-pay | Admitting: Nurse Practitioner

## 2022-11-17 ENCOUNTER — Ambulatory Visit (HOSPITAL_COMMUNITY)
Admission: EM | Admit: 2022-11-17 | Discharge: 2022-11-18 | Disposition: A | Discharge status: Other Acute Inpatient Hospital | Payer: PRIVATE HEALTH INSURANCE | Attending: Behavioral Health | Admitting: Behavioral Health

## 2022-11-17 ENCOUNTER — Encounter (HOSPITAL_COMMUNITY): Payer: Self-pay | Admitting: Behavioral Health

## 2022-11-17 DIAGNOSIS — Z793 Long term (current) use of hormonal contraceptives: Secondary | ICD-10-CM | POA: Diagnosis not present

## 2022-11-17 DIAGNOSIS — F1290 Cannabis use, unspecified, uncomplicated: Secondary | ICD-10-CM | POA: Diagnosis not present

## 2022-11-17 DIAGNOSIS — F419 Anxiety disorder, unspecified: Secondary | ICD-10-CM | POA: Diagnosis not present

## 2022-11-17 DIAGNOSIS — F17210 Nicotine dependence, cigarettes, uncomplicated: Secondary | ICD-10-CM | POA: Insufficient documentation

## 2022-11-17 DIAGNOSIS — R45851 Suicidal ideations: Secondary | ICD-10-CM | POA: Diagnosis present

## 2022-11-17 DIAGNOSIS — Z79899 Other long term (current) drug therapy: Secondary | ICD-10-CM | POA: Diagnosis not present

## 2022-11-17 DIAGNOSIS — F332 Major depressive disorder, recurrent severe without psychotic features: Secondary | ICD-10-CM | POA: Diagnosis present

## 2022-11-17 DIAGNOSIS — G4700 Insomnia, unspecified: Secondary | ICD-10-CM | POA: Insufficient documentation

## 2022-11-17 LAB — CBC WITH DIFFERENTIAL/PLATELET
Abs Immature Granulocytes: 0.02 10*3/uL (ref 0.00–0.07)
Basophils Absolute: 0 10*3/uL (ref 0.0–0.1)
Basophils Relative: 1 %
Eosinophils Absolute: 0.1 10*3/uL (ref 0.0–0.5)
Eosinophils Relative: 2 %
HCT: 46.2 % — ABNORMAL HIGH (ref 36.0–46.0)
Hemoglobin: 15.4 g/dL — ABNORMAL HIGH (ref 12.0–15.0)
Immature Granulocytes: 0 %
Lymphocytes Relative: 30 %
Lymphs Abs: 1.6 10*3/uL (ref 0.7–4.0)
MCH: 30 pg (ref 26.0–34.0)
MCHC: 33.3 g/dL (ref 30.0–36.0)
MCV: 89.9 fL (ref 80.0–100.0)
Monocytes Absolute: 0.5 10*3/uL (ref 0.1–1.0)
Monocytes Relative: 9 %
Neutro Abs: 3.1 10*3/uL (ref 1.7–7.7)
Neutrophils Relative %: 58 %
Platelets: 428 10*3/uL — ABNORMAL HIGH (ref 150–400)
RBC: 5.14 MIL/uL — ABNORMAL HIGH (ref 3.87–5.11)
RDW: 14.3 % (ref 11.5–15.5)
WBC: 5.4 10*3/uL (ref 4.0–10.5)
nRBC: 0 % (ref 0.0–0.2)

## 2022-11-17 LAB — COMPREHENSIVE METABOLIC PANEL
ALT: 12 U/L (ref 0–44)
AST: 14 U/L — ABNORMAL LOW (ref 15–41)
Albumin: 3.9 g/dL (ref 3.5–5.0)
Alkaline Phosphatase: 58 U/L (ref 38–126)
Anion gap: 10 (ref 5–15)
BUN: 8 mg/dL (ref 6–20)
CO2: 22 mmol/L (ref 22–32)
Calcium: 9.3 mg/dL (ref 8.9–10.3)
Chloride: 106 mmol/L (ref 98–111)
Creatinine, Ser: 0.8 mg/dL (ref 0.44–1.00)
GFR, Estimated: >60 mL/min (ref >60)
Glucose, Bld: 141 mg/dL — ABNORMAL HIGH (ref 70–99)
Potassium: 4.5 mmol/L (ref 3.5–5.1)
Sodium: 138 mmol/L (ref 135–145)
Total Bilirubin: 0.6 mg/dL (ref 0.3–1.2)
Total Protein: 7 g/dL (ref 6.5–8.1)

## 2022-11-17 LAB — POCT URINE DRUG SCREEN - MANUAL ENTRY (I-SCREEN)
POC Amphetamine UR: NOT DETECTED
POC Buprenorphine (BUP): NOT DETECTED
POC Cocaine UR: POSITIVE — AB
POC Marijuana UR: POSITIVE — AB
POC Methadone UR: NOT DETECTED
POC Methamphetamine UR: NOT DETECTED
POC Morphine: NOT DETECTED
POC Oxazepam (BZO): NOT DETECTED
POC Oxycodone UR: NOT DETECTED
POC Secobarbital (BAR): NOT DETECTED

## 2022-11-17 LAB — MAGNESIUM: Magnesium: 2.5 mg/dL — ABNORMAL HIGH (ref 1.7–2.4)

## 2022-11-17 LAB — POCT PREGNANCY, URINE: Preg Test, Ur: NEGATIVE

## 2022-11-17 LAB — LIPID PANEL
Cholesterol: 229 mg/dL — ABNORMAL HIGH (ref 0–200)
HDL: 37 mg/dL — ABNORMAL LOW (ref >40)
LDL Cholesterol: 169 mg/dL — ABNORMAL HIGH (ref 0–99)
Total CHOL/HDL Ratio: 6.2 RATIO
Triglycerides: 113 mg/dL (ref <150)
VLDL: 23 mg/dL (ref 0–40)

## 2022-11-17 LAB — ETHANOL: Alcohol, Ethyl (B): <10 mg/dL (ref <10)

## 2022-11-17 LAB — TSH: TSH: 2.332 u[IU]/mL (ref 0.350–4.500)

## 2022-11-17 LAB — POC URINE PREG, ED: Preg Test, Ur: NEGATIVE

## 2022-11-17 MED ORDER — TRAZODONE HCL 50 MG PO TABS
50.0000 mg | ORAL_TABLET | Freq: Every evening | ORAL | Status: DC | PRN
  Administered 2022-11-17: 50 mg via ORAL
  Filled 2022-11-17: qty 1

## 2022-11-17 MED ORDER — ACETAMINOPHEN 325 MG PO TABS: 650.0000 mg | ORAL_TABLET | Freq: Four times a day (QID) | ORAL | Status: DC | PRN

## 2022-11-17 MED ORDER — MAGNESIUM HYDROXIDE 400 MG/5ML PO SUSP: 30.0000 mL | Freq: Every day | ORAL | Status: DC | PRN

## 2022-11-17 MED ORDER — VENLAFAXINE HCL ER 75 MG PO CP24
75.0000 mg | ORAL_CAPSULE | Freq: Every day | ORAL | Status: DC
  Administered 2022-11-18: 75 mg via ORAL
  Filled 2022-11-17: qty 1

## 2022-11-17 MED ORDER — HYDROXYZINE HCL 25 MG PO TABS
25.0000 mg | ORAL_TABLET | Freq: Three times a day (TID) | ORAL | Status: DC | PRN
  Administered 2022-11-17: 25 mg via ORAL
  Filled 2022-11-17: qty 1

## 2022-11-17 MED ORDER — ALUM & MAG HYDROXIDE-SIMETH 200-200-20 MG/5ML PO SUSP: 30.0000 mL | ORAL | Status: DC | PRN
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
## 2022-11-17 NOTE — ED Notes (Addendum)
Patient resting quietly in bed with eyes closed. Respirations equal and unlabored, skin warm and dry, NAD. No change in assessment or acuity. Routine safety checks conducted according to facility protocol. Will continue to monitor for safety.   

## 2022-11-17 NOTE — ED Provider Notes (Signed)
[facility] Urgent Care Continuous Assessment Admission H&P  Date: 11/17/22 Patient Name: Penny Berger MRN: [id number] Chief Complaint: "I'm so tired of living just life is unbearable"  Diagnoses:  Final diagnoses:  Suicidal ideation  MDD (major depressive disorder), recurrent severe, without psychosis (HCC)    HPI: Penny Berger is a 47 y.o. female patient with a past psychiatric history of MDD, anxiety, panic attacks, insomnia, suicidal ideation, and pseudoseizures who presented voluntarily and accompanied by her mother to [facility] with complaints of suicidal ideations and worsening depression. Patient requested for her mother to not be present during assessment.  Patient assessed face-to-face by this provider and chart reviewed on 11/17/22. On evaluation, Penny Berger is seated in assessment area in no acute distress. Patient is alert and oriented x4, calm, cooperative, and pleasant. Speech is clear and coherent, normal rate and volume. Eye contact is good. Mood is depressed with flat and congruent affect. Thought process is coherent with logical thought content. Patient reports current passive suicidal ideations that have been occurring for "forever." Patient states "they've always been there but I have breakdowns that become unbearable." Patient states "I'm so tired of living just life is unbearable" and reports yesterday she had a thought "to ask my sister for muscle relaxers to take the whole bottle and overdose." Patient states she spent most of today crying and is unable to contract for safety at this time. Patient denies homicidal ideations. Patient denies a history of suicide attempts or self-harm. Patient reports a past psychiatric hospitalization at [facility] in November 2023. Per chart review from Dr. Lavella Lemons discharge summary on 05/08/22 0920: "Patient's psychiatric medications were adjusted on admission:  -- Start Effexor 75 mg daily, with plan to increase to 150 mg after a couple of  days.  -- Start Lithium carbonate 300 mg BID.  During the hospitalization, other adjustments were made to the patient's psychiatric medication regimen:  -Stopped lithium due to side effects -Started abilify 2 mg for adjunct to depression -Increased Effexor to 150 mg daily for depressive symptoms." Patient denies auditory and visual hallucinations. Patient denies symptoms of paranoia. Patient is able to converse coherently with goal-directed thoughts and no distractibility or preoccupation. Objectively, there is no evidence of psychosis/mania, delusional thinking, or indication that patient is responding to internal or external stimuli.  Patient reports good sleep (9+ hours/night) and good appetite. Patient lives in [place] with her mother and step-father and denies access to weapons/firearms. Patient is employed as a Lawyer at [employer]. Patient reports marijuana use via "pen" "a few puffs almost every other day," last use yesterday. Patient reports daily cocaine use and states she used "$80 worth" yesterday. Patient reports "occasional" alcohol use. Patient denies use of other illicit substances. Patient denies having current outpatient psychiatric services in place for therapy or medication management but states she is interested in starting these services. Patient states she does not currently take any psychotropic medications. Patient states "I just want some help, this is no way to live, I can't be good to anyone else if I can't be good to myself."   Patient offered support and encouragement. Discussed admission to the continuous observation unit overnight for safety monitoring with reevaluation tomorrow morning (11/18/22) for SI/HI/AVH. Discussed restarting Venlafaxine XR at 75mg  daily. Patient is in agreement with plan of care.  Total Time spent with patient: 30 minutes  Musculoskeletal  Strength & Muscle Tone: within normal limits Gait & Station: normal Patient leans: N/A  Psychiatric  Specialty Exam  Presentation General Appearance:  Appropriate for Environment; Casual  Eye Contact: Good  Speech: Clear and Coherent; Normal Rate  Speech Volume: Normal  Handedness: Right   Mood and Affect  Mood: Depressed  Affect: Congruent; Flat   Thought Process  Thought Processes: Coherent; Goal Directed  Descriptions of Associations:Intact  Orientation:Full (Time, Place and Person)  Thought Content:Logical  Diagnosis of Schizophrenia or Schizoaffective disorder in past: No   Hallucinations:Hallucinations: None  Ideas of Reference:None  Suicidal Thoughts:Suicidal Thoughts: Yes, Passive SI Passive Intent and/or Plan: Without Intent; Without Plan; Without Means to Carry Out  Homicidal Thoughts:Homicidal Thoughts: No   Sensorium  Memory: Immediate Good; Recent Good; Remote Good  Judgment: Fair  Insight: Fair   Art therapist  Concentration: Good  Attention Span: Good  Recall: Good  Fund of Knowledge: Good  Language: Good   Psychomotor Activity  Psychomotor Activity: Psychomotor Activity: Normal   Assets  Assets: Communication Skills; Desire for Improvement; Financial Resources/Insurance; Housing; Physical Health; Resilience; Social Support; Transportation   Sleep  Sleep: Sleep: Good Number of Hours of Sleep: 9   Nutritional Assessment (For OBS and FBC admissions only) Has the patient had a weight loss or gain of 10 pounds or more in the last 3 months?: No Has the patient had a decrease in food intake/or appetite?: No Does the patient have dental problems?: No Does the patient have eating habits or behaviors that may be indicators of an eating disorder including binging or inducing vomiting?: No Has the patient recently lost weight without trying?: 0 Has the patient been eating poorly because of a decreased appetite?: 0 Malnutrition Screening Tool Score: 0    Physical Exam Vitals and nursing note reviewed.   Constitutional:      General: She is not in acute distress.    Appearance: Normal appearance. She is not ill-appearing.  HENT:     Head: Normocephalic and atraumatic.     Nose: Nose normal.  Eyes:     General:        Right eye: No discharge.        Left eye: No discharge.     Conjunctiva/sclera: Conjunctivae normal.  Cardiovascular:     Rate and Rhythm: Tachycardia present.  Pulmonary:     Effort: Pulmonary effort is normal. No respiratory distress.  Musculoskeletal:        General: Normal range of motion.     Cervical back: Normal range of motion.  Skin:    General: Skin is warm and dry.  Neurological:     General: No focal deficit present.     Mental Status: She is alert and oriented to person, place, and time. Mental status is at baseline.  Psychiatric:        Attention and Perception: Attention and perception normal.        Mood and Affect: Mood is depressed. Affect is flat.        Speech: Speech normal.        Behavior: Behavior normal. Behavior is cooperative.        Thought Content: Thought content is not paranoid or delusional. Thought content includes suicidal ideation. Thought content does not include homicidal ideation. Thought content does not include homicidal or suicidal plan.        Cognition and Memory: Cognition and memory normal.        Judgment: Judgment normal.    Review of Systems  Constitutional: Negative.   HENT: Negative.    Eyes: Negative.   Respiratory: Negative.  Cardiovascular: Negative.   Gastrointestinal: Negative.   Genitourinary: Negative.   Musculoskeletal: Negative.   Skin: Negative.   Neurological: Negative.   Endo/Heme/Allergies: Negative.   Psychiatric/Behavioral:  Positive for depression, substance abuse and suicidal ideas. Negative for hallucinations and memory loss. The patient is not nervous/anxious and does not have insomnia.     Blood pressure (!) 131/96, pulse (!) 110, temperature 98.5 F (36.9 C), temperature source  Oral, resp. rate 18, SpO2 98 %. There is no height or weight on file to calculate BMI.  Past Psychiatric History: MDD, anxiety, panic attacks, insomnia, suicidal ideation, and pseudoseizures   Is the patient at risk to self? Yes  Has the patient been a risk to self in the past 6 months? Yes .    Has the patient been a risk to self within the distant past? Yes   Is the patient a risk to others? No   Has the patient been a risk to others in the past 6 months? No   Has the patient been a risk to others within the distant past? No   Past Medical History:  Past Medical History:  Diagnosis Date   Seizures (HCC)    Syncope    Family History: None reported  Social History:  Social History   Tobacco Use   Smoking status: Every Day    Packs/day: .5    Types: Cigarettes   Smokeless tobacco: Never  Vaping Use   Vaping Use: Never used  Substance Use Topics   Alcohol use: Yes   Drug use: Yes    Types: Cocaine, Marijuana   Last Labs:  Admission on 06/15/2022, Discharged on 06/15/2022  Component Date Value Ref Range Status   Sodium 06/15/2022 136  135 - 145 mmol/L Final   Potassium 06/15/2022 4.2  3.5 - 5.1 mmol/L Final   Chloride 06/15/2022 104  98 - 111 mmol/L Final   CO2 06/15/2022 21 (L)  22 - 32 mmol/L Final   Glucose, Bld 06/15/2022 91  70 - 99 mg/dL Final   Glucose reference range applies only to samples taken after fasting for at least 8 hours.   BUN 06/15/2022 10  6 - 20 mg/dL Final   Creatinine, Ser 06/15/2022 0.64  0.44 - 1.00 mg/dL Final   Calcium 16/03/9603 9.5  8.9 - 10.3 mg/dL Final   Total Protein 54/02/8118 7.7  6.5 - 8.1 g/dL Final   Albumin 14/78/2956 4.5  3.5 - 5.0 g/dL Final   AST 21/30/8657 15  15 - 41 U/L Final   ALT 06/15/2022 10  0 - 44 U/L Final   Alkaline Phosphatase 06/15/2022 61  38 - 126 U/L Final   Total Bilirubin 06/15/2022 0.4  0.3 - 1.2 mg/dL Final   GFR, Estimated 06/15/2022 >60  >60 mL/min Final   Comment: (NOTE) Calculated using the CKD-EPI  Creatinine Equation (2021)    Anion gap 06/15/2022 11  5 - 15 Final   Performed at [facility], [address] [place], [place] [address]   Lipase 06/15/2022 33  11 - 51 U/L Final   Performed at [facility], [address] [place], [place] [address]   Preg Test, Ur 06/15/2022 NEGATIVE  NEGATIVE Final   Comment:        THE SENSITIVITY OF THIS METHODOLOGY IS >20 mIU/mL. Performed at [facility], [address] [place], [place] [address]    Color, Urine 06/15/2022 YELLOW  YELLOW Final   APPearance 06/15/2022 CLEAR  CLEAR Final  Specific Gravity, Urine 06/15/2022 1.020  1.005 - 1.030 Final   pH 06/15/2022 6.5  5.0 - 8.0 Final   Glucose, UA 06/15/2022 NEGATIVE  NEGATIVE mg/dL Final   Hgb urine dipstick 06/15/2022 NEGATIVE  NEGATIVE Final   Bilirubin Urine 06/15/2022 NEGATIVE  NEGATIVE Final   Ketones, ur 06/15/2022 NEGATIVE  NEGATIVE mg/dL Final   Protein, ur 91/47/8295 NEGATIVE  NEGATIVE mg/dL Final   Nitrite 62/13/0865 NEGATIVE  NEGATIVE Final   Leukocytes,Ua 06/15/2022 NEGATIVE  NEGATIVE Final   Performed at [facility], [address] [place], [place] [address]   WBC 06/15/2022 3.7 (L)  4.0 - 10.5 K/uL Final   RBC 06/15/2022 4.82  3.87 - 5.11 MIL/uL Final   Hemoglobin 06/15/2022 14.7  12.0 - 15.0 g/dL Final   HCT 62/95/2841 43.1  36.0 - 46.0 % Final   MCV 06/15/2022 89.4  80.0 - 100.0 fL Final   MCH 06/15/2022 30.5  26.0 - 34.0 pg Final   MCHC 06/15/2022 34.1  30.0 - 36.0 g/dL Final   RDW 32/44/0102 14.8  11.5 - 15.5 % Final   Platelets 06/15/2022 429 (H)  150 - 400 K/uL Final   nRBC 06/15/2022 0.0  0.0 - 0.2 % Final   Neutrophils Relative % 06/15/2022 48  % Final   Neutro Abs 06/15/2022 1.8  1.7 - 7.7 K/uL Final   Lymphocytes Relative 06/15/2022 28  % Final   Lymphs Abs 06/15/2022 1.0  0.7 - 4.0 K/uL Final   Monocytes Relative 06/15/2022 21  % Final   Monocytes Absolute 06/15/2022 0.8  0.1  - 1.0 K/uL Final   Eosinophils Relative 06/15/2022 1  % Final   Eosinophils Absolute 06/15/2022 0.0  0.0 - 0.5 K/uL Final   Basophils Relative 06/15/2022 2  % Final   Basophils Absolute 06/15/2022 0.1  0.0 - 0.1 K/uL Final   Immature Granulocytes 06/15/2022 0  % Final   Abs Immature Granulocytes 06/15/2022 0.01  0.00 - 0.07 K/uL Final   Performed at [facility], [address] [place], [place] [address]    Allergies: Patient has no known allergies.  Medications:  Facility Ordered Medications  Medication   acetaminophen (TYLENOL) tablet 650 mg   alum & mag hydroxide-simeth (MAALOX/MYLANTA) 200-200-20 MG/5ML suspension 30 mL   magnesium hydroxide (MILK OF MAGNESIA) suspension 30 mL   hydrOXYzine (ATARAX) tablet 25 mg   traZODone (DESYREL) tablet 50 mg   [START ON 11/18/2022] venlafaxine XR (EFFEXOR-XR) 24 hr capsule 75 mg   PTA Medications  Medication Sig   medroxyPROGESTERone (DEPO-PROVERA) 150 MG/ML injection Inject 150 mg into the muscle every 3 (three) months.   Brimonidine Tartrate (LUMIFY) 0.025 % SOLN Place 2 drops into both eyes daily.   ARIPiprazole (ABILIFY) 2 MG tablet Take 1 tablet (2 mg total) by mouth daily.   hydrOXYzine (ATARAX) 25 MG tablet Take 1 tablet (25 mg total) by mouth 3 (three) times daily as needed for anxiety.   nicotine (NICODERM CQ - DOSED IN MG/24 HOURS) 14 mg/24hr patch Place 1 patch (14 mg total) onto the skin daily at 6 (six) AM.   venlafaxine XR (EFFEXOR-XR) 150 MG 24 hr capsule Take 1 capsule (150 mg total) by mouth daily with breakfast.   traZODone (DESYREL) 50 MG tablet Take 1 tablet (50 mg total) by mouth at bedtime as needed for sleep.   polyethylene glycol (MIRALAX / GLYCOLAX) 17 g packet Take 17 g by mouth daily as needed for mild constipation or moderate constipation.  bisacodyl (DULCOLAX) 5 MG EC tablet Take 1 tablet (5 mg total) by mouth daily.      Medical Decision Making  Penny Berger was admitted to [facility] continuous assessment unit for suicidal ideation, MDD (major depressive disorder), recurrent severe, without psychosis (HCC), crisis management, and stabilization. Routine labs ordered, which include Lab Orders         CBC with Differential/Platelet         Comprehensive metabolic panel         Hemoglobin A1c         Magnesium         Ethanol         Lipid panel         TSH         Prolactin         POC urine preg, ED         POCT Urine Drug Screen - (I-Screen)    Medication Management: Medications started Meds ordered this encounter  Medications   acetaminophen (TYLENOL) tablet 650 mg   alum & mag hydroxide-simeth (MAALOX/MYLANTA) 200-200-20 MG/5ML suspension 30 mL   magnesium hydroxide (MILK OF MAGNESIA) suspension 30 mL   hydrOXYzine (ATARAX) tablet 25 mg   traZODone (DESYREL) tablet 50 mg   venlafaxine XR (EFFEXOR-XR) 24 hr capsule 75 mg    Will maintain observation checks every 15 minutes for safety.   Recommendations  Based on my evaluation the patient does not appear to have an emergency medical condition.  -Recommend admission to the continuous observation unit overnight for safety monitoring with reevaluation tomorrow morning (11/18/22) for SI/HI/AVH.  -Restart Venlafaxine XR at 75mg  daily  Sunday Corn, NP 11/17/22  7:46 PM

## 2022-11-17 NOTE — ED Notes (Signed)
Patient A&Ox4. Patient reports passive SI with no plan or intent. Patient denies HI and AVH. Patient denies any physical complaints when asked. No acute distress noted. Support and encouragement provided. Routine safety checks conducted according to facility protocol. Encouraged patient to notify staff if thoughts of harm toward self or others arise. Patient verbalize understanding and agreement. Will continue to monitor for safety.

## 2022-11-18 ENCOUNTER — Other Ambulatory Visit: Payer: Self-pay

## 2022-11-18 ENCOUNTER — Inpatient Hospital Stay (HOSPITAL_COMMUNITY)
Admission: AD | Admit: 2022-11-18 | Discharge: 2022-11-23 | DRG: 885 | Disposition: A | Discharge status: Home / Self Care | Payer: PRIVATE HEALTH INSURANCE | Source: Intra-hospital | Attending: Psychiatry | Admitting: Psychiatry

## 2022-11-18 ENCOUNTER — Encounter (HOSPITAL_COMMUNITY): Payer: Self-pay | Admitting: Psychiatry

## 2022-11-18 DIAGNOSIS — F332 Major depressive disorder, recurrent severe without psychotic features: Principal | ICD-10-CM | POA: Diagnosis present

## 2022-11-18 DIAGNOSIS — F1210 Cannabis abuse, uncomplicated: Secondary | ICD-10-CM | POA: Diagnosis present

## 2022-11-18 DIAGNOSIS — Z91199 Patient's noncompliance with other medical treatment and regimen due to unspecified reason: Secondary | ICD-10-CM | POA: Diagnosis not present

## 2022-11-18 DIAGNOSIS — Z91128 Patient's intentional underdosing of medication regimen for other reason: Secondary | ICD-10-CM

## 2022-11-18 DIAGNOSIS — G4700 Insomnia, unspecified: Secondary | ICD-10-CM | POA: Diagnosis present

## 2022-11-18 DIAGNOSIS — Z91148 Patient's other noncompliance with medication regimen for other reason: Secondary | ICD-10-CM

## 2022-11-18 DIAGNOSIS — R45851 Suicidal ideations: Secondary | ICD-10-CM | POA: Diagnosis present

## 2022-11-18 DIAGNOSIS — F17210 Nicotine dependence, cigarettes, uncomplicated: Secondary | ICD-10-CM | POA: Diagnosis present

## 2022-11-18 MED ORDER — VENLAFAXINE HCL ER 75 MG PO CP24: 75.0000 mg | ORAL_CAPSULE | Freq: Every day | ORAL | Status: DC

## 2022-11-18 MED ORDER — DIPHENHYDRAMINE HCL 50 MG/ML IJ SOLN: 50.0000 mg | Freq: Three times a day (TID) | INTRAMUSCULAR | Status: DC | PRN

## 2022-11-18 MED ORDER — TRAZODONE HCL 50 MG PO TABS
50.0000 mg | ORAL_TABLET | Freq: Every evening | ORAL | Status: DC | PRN
  Administered 2022-11-18 – 2022-11-20 (×3): 50 mg via ORAL
  Filled 2022-11-18 (×3): qty 1

## 2022-11-18 MED ORDER — VENLAFAXINE HCL ER 75 MG PO CP24
75.0000 mg | ORAL_CAPSULE | Freq: Every day | ORAL | Status: DC
  Filled 2022-11-18 (×2): qty 1

## 2022-11-18 MED ORDER — NICOTINE 14 MG/24HR TD PT24
MEDICATED_PATCH | TRANSDERMAL | Status: AC
  Filled 2022-11-18: qty 1

## 2022-11-18 MED ORDER — HALOPERIDOL 5 MG PO TABS: 5.0000 mg | ORAL_TABLET | Freq: Three times a day (TID) | ORAL | Status: DC | PRN

## 2022-11-18 MED ORDER — DIPHENHYDRAMINE HCL 25 MG PO CAPS: 50.0000 mg | ORAL_CAPSULE | Freq: Three times a day (TID) | ORAL | Status: DC | PRN

## 2022-11-18 MED ORDER — ALUM & MAG HYDROXIDE-SIMETH 200-200-20 MG/5ML PO SUSP: 30.0000 mL | ORAL | Status: DC | PRN

## 2022-11-18 MED ORDER — HALOPERIDOL LACTATE 5 MG/ML IJ SOLN: 5.0000 mg | Freq: Three times a day (TID) | INTRAMUSCULAR | Status: DC | PRN

## 2022-11-18 MED ORDER — MAGNESIUM HYDROXIDE 400 MG/5ML PO SUSP: 30.0000 mL | Freq: Every day | ORAL | Status: DC | PRN

## 2022-11-18 MED ORDER — NITROFURANTOIN MONOHYD MACRO 100 MG PO CAPS
100.0000 mg | ORAL_CAPSULE | Freq: Once | ORAL | Status: AC
  Administered 2022-11-18: 100 mg via ORAL
  Filled 2022-11-18: qty 1

## 2022-11-18 MED ORDER — HYDROXYZINE HCL 25 MG PO TABS
25.0000 mg | ORAL_TABLET | Freq: Three times a day (TID) | ORAL | Status: DC | PRN
  Administered 2022-11-19: 25 mg via ORAL
  Filled 2022-11-18: qty 1

## 2022-11-18 MED ORDER — ACETAMINOPHEN 325 MG PO TABS: 650.0000 mg | ORAL_TABLET | Freq: Four times a day (QID) | ORAL | Status: DC | PRN

## 2022-11-18 MED ORDER — LORAZEPAM 2 MG/ML IJ SOLN: 2.0000 mg | Freq: Three times a day (TID) | INTRAMUSCULAR | Status: DC | PRN

## 2022-11-18 MED ORDER — NICOTINE 14 MG/24HR TD PT24
14.0000 mg | MEDICATED_PATCH | Freq: Every day | TRANSDERMAL | Status: DC
  Administered 2022-11-18 – 2022-11-23 (×6): 14 mg via TRANSDERMAL
  Filled 2022-11-18 (×7): qty 1

## 2022-11-18 MED ORDER — LORAZEPAM 1 MG PO TABS: 2.0000 mg | ORAL_TABLET | Freq: Three times a day (TID) | ORAL | Status: DC | PRN

## 2022-11-18 NOTE — Discharge Instructions (Signed)
Transfer patient to [facility] for inpatient psychiatric admission, Dr. Sherron Flemings was the accepting MD
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
## 2022-11-18 NOTE — Progress Notes (Signed)
Pt was accepted to [facility] TODAY 11/18/2022, pending signed voluntary consent faxed to [phone]. Bed assignment: 301-1  Pt meets inpatient criteria per Vernard Gambles, NP  Attending Physician will be Phineas Inches, MD  Report can be called to: - Adult unit: [phone]  Pt can arrive after pending items are received; [facility] AC to coordinate arrival time  Care Team Notified: [facility] Rona Ravens, RN, Vernard Gambles, NP, Jenean Lindau, RN, Erick Alley, RN, and Rex Kras, RN  [place], [place]  11/18/2022 10:03 AM

## 2022-11-18 NOTE — Progress Notes (Signed)
   11/18/22 2043  Psych Admission Type (Psych Patients Only)  Admission Status Voluntary  Psychosocial Assessment  Patient Complaints Anxiety;Depression  Eye Contact Brief  Facial Expression Anxious  Affect Apprehensive  Speech Logical/coherent  Interaction Assertive  Motor Activity Slow  Appearance/Hygiene Unremarkable  Behavior Characteristics Cooperative;Appropriate to situation  Mood Depressed  Thought Process  Coherency WDL  Content WDL  Delusions None reported or observed  Perception WDL  Hallucination None reported or observed  Judgment Impaired  Confusion WDL  Danger to Self  Current suicidal ideation? Denies  Agreement Not to Harm Self Yes  Description of Agreement verbal  Danger to Others  Danger to Others None reported or observed   Alert/oriented. Makes needs/concerns known to staff. Pleasant cooperative with staff. Denies SI/HI/A/V hallucinations.  Patient states went to group. Will encourage compliance and progression towards goals. Verbally contracted for safety. Will continue to monitor.

## 2022-11-18 NOTE — ED Notes (Signed)
Report called to Vikki Ports, RN at [facility], she will notify this Clinical research associate when her bed is ready. EMTALA printed .

## 2022-11-18 NOTE — Tx Team (Signed)
Initial Treatment Plan 11/18/2022 2:59 PM [name] ZOX:[id number]    PATIENT STRESSORS: Occupational concerns   Other: anniversary of father's death     PATIENT STRENGTHS: Average or above average intelligence  Capable of independent living  Communication skills  Physical Health  Supportive family/friends  Work skills    PATIENT IDENTIFIED PROBLEMS: Suicidal ideation    "I'm just tired of life"    "I wish I could not wake up"             DISCHARGE CRITERIA:  Improved stabilization in mood, thinking, and/or behavior Need for constant or close observation no longer present Reduction of life-threatening or endangering symptoms to within safe limits Verbal commitment to aftercare and medication compliance  PRELIMINARY DISCHARGE PLAN: Outpatient therapy Return to previous living arrangement Return to previous work or school arrangements  PATIENT/FAMILY INVOLVEMENT: This treatment plan has been presented to and reviewed with the patient, Penny Berger, .  The patient has been given the opportunity to ask questions and make suggestions.  Shela Nevin, RN 11/18/2022, 2:59 PM

## 2022-11-18 NOTE — ED Provider Notes (Signed)
FBC/OBS ASAP Discharge Summary  Date and Time: 11/18/2022 9:49 AM  Name: Penny Berger  MRN:  [id number]   Discharge Diagnoses:  Final diagnoses:  Suicidal ideation  MDD (major depressive disorder), recurrent severe, without psychosis (HCC)   HPI: Penny Berger is a 47 y.o. female patient with a past psychiatric history of MDD, anxiety, panic attacks, insomnia, suicidal ideation, and pseudoseizures who presented voluntarily and accompanied by her mother to [facility] on 11/17/2022 with complaints of suicidal ideations and worsening depression. Patient requested for her mother to not be present during assessment.  She was admitted to the continuous assessment unit for overnight observation.  Patient assessed face-to-face by this provider and chart reviewed on 11/18/22.  UDS on admission is positive for cocaine and marijuana.  EtOH is less than 10  Subjective: On evaluation patient is observed laying in her bed awake.  She is in no acute distress.  She is casually dressed and makes good eye contact.  She has normal speech and behavior.  She continues to endorse depression and has a flat/depressed affect.  She continues to endorse suicidal ideations and states, "this morning I thought maybe I should just go ahead and take a bottle of pills and get this over with".  She cannot contract for safety.  She denies HI/AVH.  She does not appear to be responding to internal/external stimuli.  Stay Summary: Patient has remained appropriate with staff and compliant with medications while on the unit.  She has received no as needed medications for agitation.  She continues to meet the criteria for inpatient psychiatric admission.She is in agreement.  Patient is voluntary at this time however would meet criteria for involuntary commitment if needed.  [facility] notified and patient has been accepted.  Total Time spent with patient: 30 minutes  Past Psychiatric History: As documented in H&P Past Medical History: As  documented in H&P Family History: As documented in H&P Family Psychiatric History: As documented in H&P Social History: As documented in H&P Tobacco Cessation:  N/A, patient does not currently use tobacco products  Current Medications:  Current Facility-Administered Medications  Medication Dose Route Frequency Provider Last Rate Last Admin   acetaminophen (TYLENOL) tablet 650 mg  650 mg Oral Q6H PRN Sunday Corn, NP       alum & mag hydroxide-simeth (MAALOX/MYLANTA) 200-200-20 MG/5ML suspension 30 mL  30 mL Oral Q4H PRN Sunday Corn, NP       hydrOXYzine (ATARAX) tablet 25 mg  25 mg Oral TID PRN Sunday Corn, NP   25 mg at 11/17/22 2118   magnesium hydroxide (MILK OF MAGNESIA) suspension 30 mL  30 mL Oral Daily PRN Sunday Corn, NP       traZODone (DESYREL) tablet 50 mg  50 mg Oral QHS PRN Sunday Corn, NP   50 mg at 11/17/22 2118   venlafaxine XR (EFFEXOR-XR) 24 hr capsule 75 mg  75 mg Oral Q breakfast Sunday Corn, NP   75 mg at 11/18/22 0915   Current Outpatient Medications  Medication Sig Dispense Refill   ARIPiprazole (ABILIFY) 2 MG tablet Take 1 tablet (2 mg total) by mouth daily. 30 tablet 0   bisacodyl (DULCOLAX) 5 MG EC tablet Take 1 tablet (5 mg total) by mouth daily. 30 tablet 0   Brimonidine Tartrate (LUMIFY) 0.025 % SOLN Place 2 drops into both eyes daily.     hydrOXYzine (ATARAX) 25 MG tablet Take 1 tablet (25 mg total) by mouth  3 (three) times daily as needed for anxiety. 30 tablet 0   medroxyPROGESTERone (DEPO-PROVERA) 150 MG/ML injection Inject 150 mg into the muscle every 3 (three) months.     nicotine (NICODERM CQ - DOSED IN MG/24 HOURS) 14 mg/24hr patch Place 1 patch (14 mg total) onto the skin daily at 6 (six) AM. 28 patch 0   polyethylene glycol (MIRALAX / GLYCOLAX) 17 g packet Take 17 g by mouth daily as needed for mild constipation or moderate constipation. 14 each 0   traZODone (DESYREL) 50 MG tablet Take 1 tablet (50 mg total) by mouth  at bedtime as needed for sleep. 30 tablet 0   venlafaxine XR (EFFEXOR-XR) 150 MG 24 hr capsule Take 1 capsule (150 mg total) by mouth daily with breakfast. 30 capsule 0    PTA Medications:  Facility Ordered Medications  Medication   acetaminophen (TYLENOL) tablet 650 mg   alum & mag hydroxide-simeth (MAALOX/MYLANTA) 200-200-20 MG/5ML suspension 30 mL   magnesium hydroxide (MILK OF MAGNESIA) suspension 30 mL   hydrOXYzine (ATARAX) tablet 25 mg   traZODone (DESYREL) tablet 50 mg   venlafaxine XR (EFFEXOR-XR) 24 hr capsule 75 mg   PTA Medications  Medication Sig   medroxyPROGESTERone (DEPO-PROVERA) 150 MG/ML injection Inject 150 mg into the muscle every 3 (three) months.   Brimonidine Tartrate (LUMIFY) 0.025 % SOLN Place 2 drops into both eyes daily.   hydrOXYzine (ATARAX) 25 MG tablet Take 1 tablet (25 mg total) by mouth 3 (three) times daily as needed for anxiety.   nicotine (NICODERM CQ - DOSED IN MG/24 HOURS) 14 mg/24hr patch Place 1 patch (14 mg total) onto the skin daily at 6 (six) AM.   traZODone (DESYREL) 50 MG tablet Take 1 tablet (50 mg total) by mouth at bedtime as needed for sleep.   polyethylene glycol (MIRALAX / GLYCOLAX) 17 g packet Take 17 g by mouth daily as needed for mild constipation or moderate constipation.   bisacodyl (DULCOLAX) 5 MG EC tablet Take 1 tablet (5 mg total) by mouth daily.        No data to display          Flowsheet Row ED from 11/17/2022 in [facility] ED from 06/15/2022 in [facility] Emergency Department at [facility] Ad[facility] (Discharged) [facility]  C-SSRS RISK CATEGORY High Risk No Risk No Risk       Musculoskeletal  Strength & Muscle Tone: within normal limits Gait & Station: normal Patient leans: N/A  Psychiatric Specialty Exam  Presentation  General Appearance:  Appropriate for Environment; Casual  Eye Contact: Good  Speech: Clear and  Coherent; Normal Rate  Speech Volume: Normal  Handedness: Right   Mood and Affect  Mood: Depressed  Affect: Congruent   Thought Process  Thought Processes: Coherent  Descriptions of Associations:Intact  Orientation:Full (Time, Place and Person)  Thought Content:Logical  Diagnosis of Schizophrenia or Schizoaffective disorder in past: No    Hallucinations:Hallucinations: None  Ideas of Reference:None  Suicidal Thoughts:Suicidal Thoughts: Yes, Passive SI Passive Intent and/or Plan: With Plan; With Means to Carry Out  Homicidal Thoughts:Homicidal Thoughts: No   Sensorium  Memory: Immediate Good; Recent Good; Remote Good  Judgment: Fair  Insight: Fair   Art therapist  Concentration: Good  Attention Span: Good  Recall: Good  Fund of Knowledge: Good  Language: Good   Psychomotor Activity  Psychomotor Activity: Psychomotor Activity: Normal   Assets  Assets: Communication Skills; Desire  for Improvement; Physical Health; Resilience; Social Support   Sleep  Sleep: Sleep: Good Number of Hours of Sleep: 9   Nutritional Assessment (For OBS and FBC admissions only) Has the patient had a weight loss or gain of 10 pounds or more in the last 3 months?: No Has the patient had a decrease in food intake/or appetite?: No Does the patient have dental problems?: No Does the patient have eating habits or behaviors that may be indicators of an eating disorder including binging or inducing vomiting?: No Has the patient recently lost weight without trying?: 0 Has the patient been eating poorly because of a decreased appetite?: 0 Malnutrition Screening Tool Score: 0    Physical Exam  Physical Exam Vitals and nursing note reviewed.  Constitutional:      General: She is not in acute distress.    Appearance: Normal appearance.  HENT:     Head: Normocephalic.  Eyes:     General:        Right eye: No discharge.        Left eye: No discharge.   Cardiovascular:     Rate and Rhythm: Normal rate.  Pulmonary:     Effort: Pulmonary effort is normal.  Musculoskeletal:        General: Normal range of motion.     Cervical back: Normal range of motion.  Neurological:     Mental Status: She is alert and oriented to person, place, and time.  Psychiatric:        Attention and Perception: Attention and perception normal.        Mood and Affect: Affect normal. Mood is depressed.        Speech: Speech normal.        Behavior: Behavior normal. Behavior is cooperative.        Thought Content: Thought content includes suicidal ideation. Thought content includes suicidal plan.        Cognition and Memory: Cognition normal.        Judgment: Judgment is impulsive.    Review of Systems  Constitutional: Negative.   HENT: Negative.    Eyes: Negative.   Respiratory: Negative.    Cardiovascular: Negative.   Musculoskeletal: Negative.   Skin: Negative.   Neurological: Negative.   Psychiatric/Behavioral:  Positive for depression, substance abuse and suicidal ideas.    Blood pressure 120/81, pulse 85, temperature 98.5 F (36.9 C), temperature source Oral, resp. rate 18, SpO2 99 %. There is no height or weight on file to calculate BMI.  Disposition: Discharge patient and readmit to [facility] for inpatient psychiatric admission.  Ardis Hughs, NP 11/18/2022, 9:49 AM

## 2022-11-18 NOTE — Progress Notes (Signed)
Patient is a 47 year old female who presented voluntarily from the [facility] for complaints of SI and worsening depression. Pt has a past psych hx of MDD, panic attacks,  and pseudoseizures. Pt reported that she was  admitted at [facility] last November, but did not continue taking her medications upon discharge because she didn't want to have to "depend on them." Pt reports occasional alcohol use - approx 3 x a month, but stated that she smokes weed "all the time". Pt also endorses using cocaine regularly. Pt currently denies active SI, but states, "I just don't want to be here. I wish I could not wake up." Pt was unable to name specific stressors, stated, "I'm just tired of life in general."Pt denied A/VH and does not appear to be responding to internal stimuli. Pt presented depressed- was calm and cooperative, answered questions logically and coherently during admission interview and assessment. VS monitored and recorded. Skin check completed.  Belongings searched and secured in locker. Admission paperwork completed and signed. Verbal understanding expressed.  Patient was oriented to unit and schedule. Lunch and po fluids provided. Q 15 min checks initiated for safety.

## 2022-11-18 NOTE — ED Notes (Signed)
Pt was transferred Actuary.

## 2022-11-18 NOTE — ED Notes (Signed)
Received Penny Berger this AM asleep in her chair bed, she woke up on her own, received breakfast and later was compliant with her medication. She continued to feel depressed, anxious and passive SI without a plan. She stated she has been feeling suicidal for a long time but does not have the courage to follow through with the suicidal plan. She was tearfully during her conversation with this Clinical research associate. She eventually drifted back off to sleep.

## 2022-11-18 NOTE — ED Notes (Signed)
Patient resting quietly in bed with eyes closed. Respirations equal and unlabored, skin warm and dry, NAD. Routine safety checks conducted according to facility protocol. Will continue to monitor for safety.  

## 2022-11-18 NOTE — Group Note (Signed)
Date:  11/18/2022 Time:  2:19 PM  Group Topic/Focus:  Emotional Education:   The focus of this group is to discuss what feelings/emotions are, and how they are experienced.    Participation Level:  Did Not Attend  Participation Quality:    Affect:    Cognitive:    Insight:   Engagement in Group:    Modes of Intervention:    Additional Comments:    Memory Dance Dulse Rutan 11/18/2022, 2:19 PM

## 2022-11-19 ENCOUNTER — Encounter (HOSPITAL_COMMUNITY): Payer: Self-pay

## 2022-11-19 LAB — HEMOGLOBIN A1C
Hgb A1c MFr Bld: 6.1 % — ABNORMAL HIGH (ref 4.8–5.6)
Mean Plasma Glucose: 128 mg/dL

## 2022-11-19 MED ORDER — NAPHAZOLINE-GLYCERIN 0.012-0.25 % OP SOLN
1.0000 [drp] | Freq: Four times a day (QID) | OPHTHALMIC | Status: AC | PRN
  Administered 2022-11-20: 2 [drp] via OPHTHALMIC
  Filled 2022-11-19: qty 15

## 2022-11-19 MED ORDER — FLUOXETINE HCL 20 MG PO CAPS
20.0000 mg | ORAL_CAPSULE | Freq: Every day | ORAL | Status: DC
  Administered 2022-11-19 – 2022-11-23 (×5): 20 mg via ORAL
  Filled 2022-11-19 (×6): qty 1

## 2022-11-19 MED ORDER — ARIPIPRAZOLE 2 MG PO TABS
2.0000 mg | ORAL_TABLET | Freq: Every day | ORAL | Status: DC
  Administered 2022-11-19: 2 mg via ORAL
  Filled 2022-11-19 (×3): qty 1

## 2022-11-19 MED ORDER — BISACODYL 5 MG PO TBEC
5.0000 mg | DELAYED_RELEASE_TABLET | Freq: Every day | ORAL | Status: DC | PRN
  Administered 2022-11-19 – 2022-11-21 (×2): 5 mg via ORAL
  Filled 2022-11-19 (×2): qty 1

## 2022-11-19 NOTE — Group Note (Unsigned)
Date:  11/19/2022 Time:  6:22 PM  Group Topic/Focus:  Wellness Toolbox:   The focus of this group is to discuss various aspects of wellness, balancing those aspects and exploring ways to increase the ability to experience wellness.  Patients will create a wellness toolbox for use upon discharge.     Participation Level:  {BHH PARTICIPATION LEVEL:22264}  Participation Quality:  {BHH PARTICIPATION QUALITY:22265}  Affect:  {BHH AFFECT:22266}  Cognitive:  {BHH COGNITIVE:22267}  Insight: {BHH Insight2:20797}  Engagement in Group:  {BHH ENGAGEMENT IN GROUP:22268}  Modes of Intervention:  {BHH MODES OF INTERVENTION:22269}  Additional Comments:  ***  Penny Berger 11/19/2022, 6:22 PM  

## 2022-11-19 NOTE — BH IP Treatment Plan (Signed)
Interdisciplinary Treatment and Diagnostic Plan Update  11/19/2022 Time of Session: 10:30 AM  RHODES BORUNDA MRN: [id number]  Principal Diagnosis: MDD (major depressive disorder), recurrent severe, without psychosis (HCC)  Secondary Diagnoses: Principal Problem:   MDD (major depressive disorder), recurrent severe, without psychosis (HCC)   Current Medications:  Current Facility-Administered Medications  Medication Dose Route Frequency Provider Last Rate Last Admin   acetaminophen (TYLENOL) tablet 650 mg  650 mg Oral Q6H PRN Ardis Hughs, NP       alum & mag hydroxide-simeth (MAALOX/MYLANTA) 200-200-20 MG/5ML suspension 30 mL  30 mL Oral Q4H PRN Ardis Hughs, NP       ARIPiprazole (ABILIFY) tablet 2 mg  2 mg Oral QHS Rex Kras, MD       bisacodyl (DULCOLAX) EC tablet 5 mg  5 mg Oral Daily PRN Rex Kras, MD       diphenhydrAMINE (BENADRYL) capsule 50 mg  50 mg Oral TID PRN Ardis Hughs, NP       Or   diphenhydrAMINE (BENADRYL) injection 50 mg  50 mg Intramuscular TID PRN Ardis Hughs, NP       FLUoxetine (PROZAC) capsule 20 mg  20 mg Oral Daily Rex Kras, MD       haloperidol (HALDOL) tablet 5 mg  5 mg Oral TID PRN Ardis Hughs, NP       Or   haloperidol lactate (HALDOL) injection 5 mg  5 mg Intramuscular TID PRN Ardis Hughs, NP       hydrOXYzine (ATARAX) tablet 25 mg  25 mg Oral TID PRN Ardis Hughs, NP   25 mg at 11/19/22 4098   LORazepam (ATIVAN) tablet 2 mg  2 mg Oral TID PRN Ardis Hughs, NP       Or   LORazepam (ATIVAN) injection 2 mg  2 mg Intramuscular TID PRN Ardis Hughs, NP       magnesium hydroxide (MILK OF MAGNESIA) suspension 30 mL  30 mL Oral Daily PRN Ardis Hughs, NP       naphazoline-glycerin (CLEAR EYES REDNESS) ophth solution 1-2 drop  1-2 drop Both Eyes QID PRN Rex Kras, MD       nicotine (NICODERM CQ - dosed in mg/24 hours) patch 14 mg  14 mg Transdermal Daily Massengill,  Harrold Donath, MD   14 mg at 11/19/22 0851   traZODone (DESYREL) tablet 50 mg  50 mg Oral QHS PRN Ardis Hughs, NP   50 mg at 11/18/22 2133   PTA Medications: Medications Prior to Admission  Medication Sig Dispense Refill Last Dose   Brimonidine Tartrate 0.025 % SOLN Apply 2 drops to eye daily. OTC med      medroxyPROGESTERone (DEPO-PROVERA) 150 MG/ML injection Inject 150 mg into the muscle every 3 (three) months.   Past Month   venlafaxine XR (EFFEXOR-XR) 75 MG 24 hr capsule Take 1 capsule (75 mg total) by mouth daily with breakfast. (Patient not taking: Reported on 11/18/2022)   Not Taking    Patient Stressors: Occupational concerns   Other: anniversary of father's death    Patient Strengths: Average or above average intelligence  Capable of independent living  Communication skills  Physical Health  Supportive family/friends  Work skills   Treatment Modalities: Medication Management, Group therapy, Case management,  1 to 1 session with clinician, Psychoeducation, Recreational therapy.   Physician Treatment Plan for Primary Diagnosis: MDD (major depressive disorder), recurrent severe, without psychosis (HCC) Long Term Goal(s): Improvement in  symptoms so as ready for discharge   Short Term Goals: Ability to verbalize feelings will improve Ability to identify and develop effective coping behaviors will improve Compliance with prescribed medications will improve Ability to disclose and discuss suicidal ideas Ability to demonstrate self-control will improve Ability to maintain clinical measurements within normal limits will improve  Medication Management: Evaluate patient's response, side effects, and tolerance of medication regimen.  Therapeutic Interventions: 1 to 1 sessions, Unit Group sessions and Medication administration.  Evaluation of Outcomes: Not Progressing  Physician Treatment Plan for Secondary Diagnosis: Principal Problem:   MDD (major depressive disorder), recurrent  severe, without psychosis (HCC)  Long Term Goal(s): Improvement in symptoms so as ready for discharge   Short Term Goals: Ability to verbalize feelings will improve Ability to identify and develop effective coping behaviors will improve Compliance with prescribed medications will improve Ability to disclose and discuss suicidal ideas Ability to demonstrate self-control will improve Ability to maintain clinical measurements within normal limits will improve     Medication Management: Evaluate patient's response, side effects, and tolerance of medication regimen.  Therapeutic Interventions: 1 to 1 sessions, Unit Group sessions and Medication administration.  Evaluation of Outcomes: Not Progressing   RN Treatment Plan for Primary Diagnosis: MDD (major depressive disorder), recurrent severe, without psychosis (HCC) Long Term Goal(s): Knowledge of disease and therapeutic regimen to maintain health will improve  Short Term Goals: Ability to remain free from injury will improve, Ability to verbalize frustration and anger appropriately will improve, Ability to demonstrate self-control, Ability to participate in decision making will improve, Ability to verbalize feelings will improve, Ability to disclose and discuss suicidal ideas, Ability to identify and develop effective coping behaviors will improve, and Compliance with prescribed medications will improve  Medication Management: RN will administer medications as ordered by provider, will assess and evaluate patient's response and provide education to patient for prescribed medication. RN will report any adverse and/or side effects to prescribing provider.  Therapeutic Interventions: 1 on 1 counseling sessions, Psychoeducation, Medication administration, Evaluate responses to treatment, Monitor vital signs and CBGs as ordered, Perform/monitor CIWA, COWS, AIMS and Fall Risk screenings as ordered, Perform wound care treatments as  ordered.  Evaluation of Outcomes: Not Progressing   LCSW Treatment Plan for Primary Diagnosis: MDD (major depressive disorder), recurrent severe, without psychosis (HCC) Long Term Goal(s): Safe transition to appropriate next level of care at discharge, Engage patient in therapeutic group addressing interpersonal concerns.  Short Term Goals: Engage patient in aftercare planning with referrals and resources, Increase social support, Increase ability to appropriately verbalize feelings, Increase emotional regulation, Facilitate acceptance of mental health diagnosis and concerns, Facilitate patient progression through stages of change regarding substance use diagnoses and concerns, Identify triggers associated with mental health/substance abuse issues, and Increase skills for wellness and recovery  Therapeutic Interventions: Assess for all discharge needs, 1 to 1 time with Social worker, Explore available resources and support systems, Assess for adequacy in community support network, Educate family and significant other(s) on suicide prevention, Complete Psychosocial Assessment, Interpersonal group therapy.  Evaluation of Outcomes: Not Progressing   Progress in Treatment: Attending groups: No. Participating in groups: No. Taking medication as prescribed: Yes. Toleration medication: Yes. Family/Significant other contact made: No, will contact:  Whoever, patient gives CSW permission to speak with  Patient understands diagnosis: Yes. Discussing patient identified problems/goals with staff: Yes. Medical problems stabilized or resolved: Yes. Denies suicidal/homicidal ideation: Yes. Issues/concerns per patient self-inventory: No.   New problem(s) identified: No, Describe:  None  reported   New Short Term/Long Term Goal(s):medication stabilization, elimination of SI thoughts, development of comprehensive mental wellness plan.    Patient Goals:  " Medication adjusted for her depression "    Discharge Plan or Barriers: Patient recently admitted. CSW will continue to follow and assess for appropriate referrals and possible discharge planning.    Reason for Continuation of Hospitalization: Anxiety Depression Medication stabilization Suicidal ideation  Estimated Length of Stay: 3-5 days   Last 3 [place] Suicide Severity Risk Score: Flowsheet Row Ad[facility] (Current[facility] from 11/17/2022 in [facility] ED from 06/15/2022 in [facility] Emergency Department at [facility]  C-SSRS RISK CATEGORY High Risk High Risk No Risk       Last PHQ 2/9 Scores:     No data to display          Scribe for Treatment Team: Isabella Bowens, Theresia Majors 11/19/2022 1:03 PM
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
## 2022-11-19 NOTE — Group Note (Signed)
Date:  11/19/2022 Time:  6:18 PM  Group Topic/Focus:  Wellness Toolbox:   The focus of this group is to discuss various aspects of wellness, balancing those aspects and exploring ways to increase the ability to experience wellness.  Patients will create a wellness toolbox for use upon discharge.    Participation Level:  Did Not Attend  Participation Quality:    Affect:    Cognitive:    Insight:   Engagement in Group:    Modes of Intervention:    Additional Comments:   Jaquita Rector 11/19/2022, 6:18 PM

## 2022-11-19 NOTE — Plan of Care (Signed)
  Problem: Safety: Goal: Periods of time without injury will increase Outcome: Progressing   

## 2022-11-19 NOTE — Group Note (Signed)
Date:  11/19/2022 Time:  10:28 AM  Group Topic/Focus:  Goals Group:   The focus of this group is to help patients establish daily goals to achieve during treatment and discuss how the patient can incorporate goal setting into their daily lives to aide in recovery. Orientation:   The focus of this group is to educate the patient on the purpose and policies of crisis stabilization and provide a format to answer questions about their admission.  The group details unit policies and expectations of patients while admitted.    Participation Level:  Did Not Attend  Participation Quality:    Affect:    Cognitive:    Insight:   Engagement in Group:    Modes of Intervention:    Additional Comments:    Jaquita Rector 11/19/2022, 10:28 AM

## 2022-11-19 NOTE — [facility] Group Notes (Signed)
[facility] Group Notes:  (Nursing/MHT/Case Management/Adjunct)  Date:  11/19/2022  Time:  2:11 AM  Type of Therapy:   Wrap-up group  Participation Level:  Minimal  Participation Quality:  Appropriate  Affect:  Appropriate  Cognitive:  Appropriate  Insight:  Good  Engagement in Group:  Engaged  Modes of Intervention:  Education  Summary of Progress/Problems: Pt states no goal due to just came today. Day 1/10.  Noah Delaine 11/19/2022, 2:11 AM

## 2022-11-19 NOTE — Group Note (Unsigned)
Date:  11/19/2022 Time:  10:08 AM  Group Topic/Focus:  Goals Group:   The focus of this group is to help patients establish daily goals to achieve during treatment and discuss how the patient can incorporate goal setting into their daily lives to aide in recovery. Orientation:   The focus of this group is to educate the patient on the purpose and policies of crisis stabilization and provide a format to answer questions about their admission.  The group details unit policies and expectations of patients while admitted.     Participation Level:  {[facility] PARTICIPATION LEVEL:22264}  Participation Quality:  {[facility] PARTICIPATION QUALITY:22265}  Affect:  {[facility] AFFECT:22266}  Cognitive:  {[facility] COGNITIVE:22267}  Insight: {[facility] Insight2:20797}  Engagement in Group:  {[facility] ENGAGEMENT IN GROUP:22268}  Modes of Intervention:  {[facility] MODES OF INTERVENTION:22269}  Additional Comments:  ***  Penny Berger 11/19/2022, 10:08 AM  

## 2022-11-19 NOTE — Progress Notes (Signed)
Pt denies suicidal or homicidal thoughts today. Pt presents with sad affect. Pt refused effexor this morning. Provider notified. Pt accepted first dose of prozac. Pt complains of constipation and received dulcolax prn. Q 15 minute checks ongoing.

## 2022-11-19 NOTE — Group Note (Signed)
Recreation Therapy Group Note   Group Topic:Problem Solving  Group Date: 11/19/2022 Start Time: 0932 End Time: 1012 Facilitators: Daveah Varone-McCall, LRT,CTRS Location: [address] Day[address]   Goal Area(s) Addresses:  Patient will effectively work with peer towards shared goal.  Patient will identify skills used to make activity successful.  Patient will share challenges and verbalize solution-driven approaches used. Patient will identify how skills used during activity can be used to reach post d/c goals.   Group Description:  [employer]. Patients were provided the following materials: 4 drinking straws, 5 rubber bands, 5 paper clips, 2 index cards and 2 drinking cups. Using the provided materials patients were asked to build a launching mechanism to launch a ping pong ball across the room, approximately 10 feet. Patients were divided into teams of 3-5. Instructions required all materials be incorporated into the device, functionality of items left to the peer group's discretion.   Affect/Mood: N/A   Participation Level: Did not attend    Clinical Observations/Individualized Feedback:     Plan: Continue to engage patient in RT group sessions 2-3x/week.   Avishai Reihl-McCall, LRT,CTRS 11/19/2022 12:33 PM

## 2022-11-19 NOTE — H&P (Signed)
Psychiatric Admission Assessment Adult  Patient Identification: Penny Berger MRN:  [id number] Date of Evaluation:  11/19/2022 Chief Complaint:  MDD (major depressive disorder), recurrent severe, without psychosis (HCC) [F33.2] Principal Diagnosis: MDD (major depressive disorder), recurrent severe, without psychosis (HCC) Diagnosis:  Principal Problem:   MDD (major depressive disorder), recurrent severe, without psychosis (HCC) Identifying information and reason for admission: The patient is a 47 year old female with a past history of depression, anxiety and panic disorder insomnia and suicidal ideations who also has a history of pseudoseizures.  She presented herself voluntarily accompanied by her mother with complaints of suicidal ideations and worsening depression.Marland Kitchen History of Present Illness: The patient presented herself to [facility] accompanied by her mother with symptoms of depression and suicidal ideations.  She reports that she was last admitted to this facility in November.  Since then she stopped taking her medication because it was not helping her.  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  She reports increasing depression with suicidal ideations that has been getting progressively worse.  She claims that she is" tired of living and that life is unbearable."  She continues to abuse medications and continues to be noncompliant of antidepressants.  Apparently patient was on Effexor and on lithium which was later discontinued due to side effects.Abilify was added for depression.She continued to have worsening symptoms of depression with no specific trigger.  Sleep is poor and her appetite is poor and she admits to feelings of helplessness and hopelessness.  She denies any paranoia and denies any auditory or visual hallucinations.  She was unable to contract for safety and was admitted voluntarily  for further treatment. On assessment today: The patient was admitted to [facility] and was evaluated today.  She also had a treatment team.  She continues to endorse ongoing symptoms for depression and feelings that life was not worth living.  She feels that these symptoms have been very chronic but periodically get worse.  She uses Vape to smoke marijuana daily and also uses cocaine.  She lives with her parents.  She has 1 son who was 42 year old.  She works as a Scientist, product/process development and reports that her goal is to get back on some medication to help her with her depression.  She also complains of some constipation and high allergies for which she needed some medications.  She is motivated to get better but continues to feel helpless.  While in the hospital she is contracting for safety. Tentative plan: Since the patient seems to be reluctant to stay on the Effexor because of her perception that it was not effective, we will consider a trial of Prozac.  She will continue to attend groups and individual sessions as indicated. Associated Signs/Symptoms: Depression Symptoms:  depressed mood, anhedonia, insomnia, psychomotor retardation, fatigue, feelings of worthlessness/guilt, difficulty concentrating, suicidal thoughts without plan, (Hypo) Manic Symptoms:  Impulsivity, Anxiety Symptoms:   No specific phobia or generalized anxiety. Psychotic Symptoms:   Denies hallucinations or delusions. PTSD Symptoms: Negative Total Time spent with patient: 30 minutes  Past Psychiatric History: Patient has a history of major depression, panic attacks and pseudoseizures.  She was last hospitalized in November 2023.  She has not been compliant with outpatient treatment.  Is the patient at risk to self? Yes.    Has the patient been a risk to self in the past 6 months? Yes.    Has  the patient been a risk to self within the distant past? Yes.    Is the patient a risk to others? No.  Has the patient been a risk to  others in the past 6 months? No.  Has the patient been a risk to others within the distant past? No.   [place] Scale:  Flowsheet Row Ad[facility] ([facility]) [facility] from 11/17/2022 in [facility] ED from [facility]/15/2022 in [facility] at [facility]  C-SSRS RISK CATEGORY High Risk High Risk No Risk        Prior Inpatient Therapy: Yes.   If yes, describe admitted in November 2023. Prior Outpatient Therapy: Yes.   If yes, describe noncompliant.  Alcohol Screening: 1. How often do you have a drink containing alcohol?: 2 to 4 times a month 2. How many drinks containing alcohol do you have on a typical day when you are drinking?: 1 or 2 3. How often do you have six or more drinks on one occasion?: Never AUDIT-C Score: 2 4. How often during the last year have you found that you were not able to stop drinking once you had started?: Never 5. How often during the last year have you failed to do what was normally expected from you because of drinking?: Never 6. How often during the last year have you needed a first drink in the morning to get yourself going after a heavy drinking session?: Never 7. How often during the last year have you had a feeling of guilt of remorse after drinking?: Never 8. How often during the last year have you been unable to remember what happened the night before because you had been drinking?: Never 9. Have you or someone else been injured as a result of your drinking?: No 10. Has a relative or friend or a doctor or another health worker been concerned about your drinking or suggested you cut down?: No Alcohol Use Disorder Identification Test Final Score (AUDIT): 2 Substance Abuse History in the last 12 months:  Yes.   Consequences of Substance Abuse: Uses marijuana daily and cocaine.  She does not perceive it as an issue. Previous Psychotropic Medications: Yes   Psychological Evaluations: No  Past Medical History:  Past Medical History:  Diagnosis Date   Seizures (HCC)    Syncope     Past Surgical History:  Procedure Laterality Date   APPENDECTOMY     CESAREAN SECTION     Family History: History reviewed. No pertinent family history. Family Psychiatric  History: Unknown at this time Tobacco Screening:  Social History   Tobacco Use  Smoking Status Every Day   Packs/day: .5   Types: Cigarettes  Smokeless Tobacco Never    BH Tobacco Counseling     Are you interested in Tobacco Cessation Medications?  Yes, implement Nicotene Replacement Protocol Counseled patient on smoking cessation:  Yes Reason Tobacco Screening Not Completed: No value filed.       Social History:  Social History   Substance and Sexual Activity  Alcohol Use Yes     Social History   Substance and Sexual Activity  Drug Use Yes   Types: Cocaine, Marijuana    Additional Social History: Marital status: Long term relationship Long term relationship, how long?: 10 years off and on What types of issues is patient dealing with in the relationship?: off and on when it comes to being in a relationship Are you sexually active?:  Yes What is your sexual orientation?: Straight but open to being with a woman Has your sexual activity been affected by drugs, alcohol, medication, or emotional stress?: yes Does patient have children?: Yes How many children?: 1 How is patient's relationship with their children?: No as good as it should be                         Allergies:  No Known Allergies Lab Results:  Results for orders placed or performed during the hospital encounter of 11/17/22 (from the past 48 hour(s))  POC urine preg, ED     Status: Normal   Collection Time: 11/17/22  7:45 PM  Result Value Ref Range   Preg Test, Ur Negative Negative  POCT Urine Drug Screen - (I-Screen)     Status: Abnormal   Collection Time: 11/17/22  7:45 PM  Result Value Ref  Range   POC Amphetamine UR None Detected NONE DETECTED (Cut Off Level 1000 ng/mL)   POC Secobarbital (BAR) None Detected NONE DETECTED (Cut Off Level 300 ng/mL)   POC Buprenorphine (BUP) None Detected NONE DETECTED (Cut Off Level 10 ng/mL)   POC Oxazepam (BZO) None Detected NONE DETECTED (Cut Off Level 300 ng/mL)   POC Cocaine UR Positive (A) NONE DETECTED (Cut Off Level 300 ng/mL)   POC Methamphetamine UR None Detected NONE DETECTED (Cut Off Level 1000 ng/mL)   POC Morphine None Detected NONE DETECTED (Cut Off Level 300 ng/mL)   POC Methadone UR None Detected NONE DETECTED (Cut Off Level 300 ng/mL)   POC Oxycodone UR None Detected NONE DETECTED (Cut Off Level 100 ng/mL)   POC Marijuana UR Positive (A) NONE DETECTED (Cut Off Level 50 ng/mL)  Hemoglobin A1c     Status: Abnormal   Collection Time: 11/17/22  7:54 PM  Result Value Ref Range   Hgb A1c MFr Bld 6.1 (H) 4.8 - 5.6 %    Comment: (NOTE)         Prediabetes: 5.7 - 6.4         Diabetes: >6.4         Glycemic control for adults with diabetes: <7.0    Mean Plasma Glucose 128 mg/dL    Comment: (NOTE) Performed At: [facility] [address] [place], [place] [id number] Jolene Schimke MD NW:[id number]   Ethanol     Status: None   Collection Time: 11/17/22  7:54 PM  Result Value Ref Range   Alcohol, Ethyl (B) <10 <10 mg/dL    Comment: (NOTE) Lowest detectable limit for serum alcohol is 10 mg/dL.  For medical purposes only. Performed at [facility], [address] [place], [place] [address]   Lipid panel     Status: Abnormal   Collection Time: 11/17/22  7:54 PM  Result Value Ref Range   Cholesterol 229 (H) 0 - 200 mg/dL   Triglycerides 962 <952 mg/dL   HDL 37 (L) >84 mg/dL   Total CHOL/HDL Ratio 6.2 RATIO   VLDL 23 0 - 40 mg/dL   LDL Cholesterol 132 (H) 0 - 99 mg/dL    Comment:        Total Cholesterol/HDL:CHD Risk Coronary Heart Disease Risk Table                     Men   Women  1/2 Average Risk   3.4    3.3  Average Risk       5.0   4.4  2 X Average Risk   9.6   7.1  3 X Average Risk  23.4   11.0        Use the calculated Patient Ratio above and the CHD Risk Table to determine the patient's CHD Risk.        ATP III CLASSIFICATION (LDL):  <100     mg/dL   Optimal  161-096  mg/dL   Near or Above                    Optimal  130-159  mg/dL   Borderline  045-409  mg/dL   High  >811     mg/dL   Very High Performed at [facility], [address], [place], [place] [address]   TSH     Status: None   Collection Time: 11/17/22  7:54 PM  Result Value Ref Range   TSH 2.332 0.350 - 4.500 uIU/mL    Comment: Performed by a 3rd Generation assay with a functional sensitivity of <=0.01 uIU/mL. Performed at [facility], [address] [place], [place] [address]   CBC with Differential/Platelet     Status: Abnormal   Collection Time: 11/17/22  7:55 PM  Result Value Ref Range   WBC 5.4 4.0 - 10.5 K/uL   RBC 5.14 (H) 3.87 - 5.11 MIL/uL   Hemoglobin 15.4 (H) 12.0 - 15.0 g/dL   HCT 13.0 (H) 86.5 - 78.4 %   MCV 89.9 80.0 - 100.0 fL   MCH 30.0 26.0 - 34.0 pg   MCHC 33.3 30.0 - 36.0 g/dL   RDW 69.6 29.5 - 28.4 %   Platelets 428 (H) 150 - 400 K/uL   nRBC 0.0 0.0 - 0.2 %   Neutrophils Relative % 58 %   Neutro Abs 3.1 1.7 - 7.7 K/uL   Lymphocytes Relative 30 %   Lymphs Abs 1.6 0.7 - 4.0 K/uL   Monocytes Relative 9 %   Monocytes Absolute 0.5 0.1 - 1.0 K/uL   Eosinophils Relative 2 %   Eosinophils Absolute 0.1 0.0 - 0.5 K/uL   Basophils Relative 1 %   Basophils Absolute 0.0 0.0 - 0.1 K/uL   Immature Granulocytes 0 %   Abs Immature Granulocytes 0.02 0.00 - 0.07 K/uL    Comment: Performed at [facility], [address], [place], [place] [address]  Comprehensive metabolic panel     Status: Abnormal   Collection Time: 11/17/22  7:55 PM  Result Value Ref Range   Sodium 138 135 - 145 mmol/L   Potassium 4.5 3.5 - 5.1 mmol/L   Chloride 106 98 - 111 mmol/L   CO2 22 22 - 32  mmol/L   Glucose, Bld 141 (H) 70 - 99 mg/dL    Comment: Glucose reference range applies only to samples taken after fasting for at least 8 hours.   BUN 8 6 - 20 mg/dL   Creatinine, Ser 0.10 0.44 - 1.00 mg/dL   Calcium 9.3 8.9 - 27.2 mg/dL   Total Protein 7.0 6.5 - 8.1 g/dL   Albumin 3.9 3.5 - 5.0 g/dL   AST 14 (L) 15 - 41 U/L   ALT 12 0 - 44 U/L   Alkaline Phosphatase 58 38 - 126 U/L   Total Bilirubin 0.6 0.3 - 1.2 mg/dL   GFR, Estimated >53 >66 mL/min    Comment: (NOTE) Calculated using the CKD-EPI Creatinine Equation (2021)    Anion gap 10 5 - 15  Comment: Performed at [facility], [address], [place], [place] [address]  Magnesium     Status: Abnormal   Collection Time: 11/17/22  7:55 PM  Result Value Ref Range   Magnesium 2.5 (H) 1.7 - 2.4 mg/dL    Comment: Performed at [facility], [address], [place], [place] [address]  Pregnancy, urine POC     Status: None   Collection Time: 11/17/22  7:58 PM  Result Value Ref Range   Preg Test, Ur NEGATIVE NEGATIVE    Comment:        THE SENSITIVITY OF THIS METHODOLOGY IS >24 mIU/mL     Blood Alcohol level:  Lab Results  Component Value Date   ETH <10 11/17/2022   ETH <10 05/02/2022    Metabolic Disorder Labs:  Lab Results  Component Value Date   HGBA1C 6.1 (H) 11/17/2022   MPG 128 11/17/2022   MPG 116.89 05/02/2022   Lab Results  Component Value Date   PROLACTIN 5.0 05/02/2022   Lab Results  Component Value Date   CHOL 229 (H) 11/17/2022   TRIG 113 11/17/2022   HDL 37 (L) 11/17/2022   CHOLHDL 6.2 11/17/2022   VLDL 23 11/17/2022   LDLCALC 169 (H) 11/17/2022   LDLCALC 154 (H) 05/02/2022    Current Medications: Current Facility-Administered Medications  Medication Dose Route Frequency Provider Last Rate Last Admin   acetaminophen (TYLENOL) tablet 650 mg  650 mg Oral Q6H PRN Ardis Hughs, NP       alum & mag hydroxide-simeth (MAALOX/MYLANTA) 200-200-20 MG/5ML suspension 30 mL  30 mL  Oral Q4H PRN Ardis Hughs, NP       diphenhydrAMINE (BENADRYL) capsule 50 mg  50 mg Oral TID PRN Ardis Hughs, NP       Or   diphenhydrAMINE (BENADRYL) injection 50 mg  50 mg Intramuscular TID PRN Ardis Hughs, NP       haloperidol (HALDOL) tablet 5 mg  5 mg Oral TID PRN Ardis Hughs, NP       Or   haloperidol lactate (HALDOL) injection 5 mg  5 mg Intramuscular TID PRN Ardis Hughs, NP       hydrOXYzine (ATARAX) tablet 25 mg  25 mg Oral TID PRN Ardis Hughs, NP   25 mg at 11/19/22 0981   LORazepam (ATIVAN) tablet 2 mg  2 mg Oral TID PRN Ardis Hughs, NP       Or   LORazepam (ATIVAN) injection 2 mg  2 mg Intramuscular TID PRN Ardis Hughs, NP       magnesium hydroxide (MILK OF MAGNESIA) suspension 30 mL  30 mL Oral Daily PRN Ardis Hughs, NP       nicotine (NICODERM CQ - dosed in mg/24 hours) patch 14 mg  14 mg Transdermal Daily Massengill, Harrold Donath, MD   14 mg at 11/19/22 0851   traZODone (DESYREL) tablet 50 mg  50 mg Oral QHS PRN Ardis Hughs, NP   50 mg at 11/18/22 2133   venlafaxine XR (EFFEXOR-XR) 24 hr capsule 75 mg  75 mg Oral Q breakfast Ardis Hughs, NP       PTA Medications: Medications Prior to Admission  Medication Sig Dispense Refill Last Dose   Brimonidine Tartrate 0.025 % SOLN Apply 2 drops to eye daily. OTC med      medroxyPROGESTERone (DEPO-PROVERA) 150 MG/ML injection Inject 150 mg into the muscle every 3 (three) months.  Past Month   venlafaxine XR (EFFEXOR-XR) 75 MG 24 hr capsule Take 1 capsule (75 mg total) by mouth daily with breakfast. (Patient not taking: Reported on 11/18/2022)   Not Taking    Musculoskeletal: Strength & Muscle Tone: within normal limits Gait & Station: normal Patient leans: N/A            Psychiatric Specialty Exam:  Presentation  General Appearance:  Casual; Disheveled  Eye Contact: Fair  Speech: Clear and Coherent  Speech  Volume: Decreased  Handedness: Right   Mood and Affect  Mood: Anxious; Depressed  Affect: Depressed   Thought Process  Thought Processes: Coherent  Duration of Psychotic Symptoms:N/A Past Diagnosis of Schizophrenia or Psychoactive disorder: No  Descriptions of Associations:Intact  Orientation:Full (Time, Place and Person)  Thought Content:Logical  Hallucinations:Hallucinations: None  Ideas of Reference:None  Suicidal Thoughts:Suicidal Thoughts: Yes, Passive SI Passive Intent and/or Plan: Without Intent; Without Plan  Homicidal Thoughts:Homicidal Thoughts: No   Sensorium  Memory: Immediate Fair; Remote Fair; Recent Fair  Judgment: Fair  Insight: Fair   Chartered certified accountant: Fair  Attention Span: Fair  Recall: [employer] of Knowledge: Fair  Language: Fair   Psychomotor Activity  Psychomotor Activity: Psychomotor Activity: Normal   Assets  Assets: Manufacturing systems engineer; Desire for Improvement; Housing; Vocational/Educational   Sleep  Sleep: Sleep: Fair    Physical Exam: Physical Exam Constitutional:      Appearance: Normal appearance.  HENT:     Head: Normocephalic.  Neurological:     General: No focal deficit present.     Mental Status: She is alert and oriented to person, place, and time. Mental status is at baseline.    Review of Systems  Psychiatric/Behavioral:  Positive for depression, substance abuse and suicidal ideas. The patient is nervous/anxious.    Blood pressure (!) 130/92, pulse (!) 103, temperature 98.2 F (36.8 C), temperature source Oral, resp. rate 16, height 5\' 4"  (1.626 m), weight 75.8 kg, SpO2 97 %. Body mass index is 28.67 kg/m.  Treatment Plan Summary: Daily contact with patient to assess and evaluate symptoms and progress in treatment and Medication management  Observation Level/Precautions:  15 minute checks  Laboratory:  CBC Chemistry Profile HbAIC HCG UDS  Psychotherapy:  Individual and group sessions.  Medications: Discontinue Effexor. Begin Prozac 20 mg a day. Abilify 2 mg at night.  Consultations: None  Discharge Concerns: Discuss the use of substances and its impact on her depression.  Referred to outpatient rehab.  Estimated LOS: 5 days  Other:     Physician Treatment Plan for Primary Diagnosis: MDD (major depressive disorder), recurrent severe, without psychosis (HCC) Long Term Goal(s): Improvement in symptoms so as ready for discharge  Short Term Goals: Ability to verbalize feelings will improve, Ability to disclose and discuss suicidal ideas, Ability to demonstrate self-control will improve, Ability to identify and develop effective coping behaviors will improve, and Ability to maintain clinical measurements within normal limits will improve  Physician Treatment Plan for Secondary Diagnosis: Principal Problem:   MDD (major depressive disorder), recurrent severe, without psychosis (HCC)  Long Term Goal(s): Improvement in symptoms so as ready for discharge  Short Term Goals: Ability to verbalize feelings will improve, Ability to identify and develop effective coping behaviors will improve, and Compliance with prescribed medications will improve  I certify that inpatient services furnished can reasonably be expected to improve the patient's condition.    Rex Kras, MD 6/5/202411:30 AM

## 2022-11-19 NOTE — Progress Notes (Signed)
   11/19/22 2045  Charting Type  Charting Type Shift assessment  Safety Check Verification  Has the RN verified the 15 minute safety check completion? Yes  Neurological  Neuro (WDL) WDL  HEENT  HEENT (WDL) WDL  Respiratory  Respiratory (WDL) WDL  Cardiac  Cardiac (WDL) WDL  Vascular  Vascular (WDL) WDL  Integumentary  Integumentary (WDL) X  Braden Scale (Ages 8 and up)  Sensory Perceptions 4  Moisture 4  Activity 4  Mobility 4  Nutrition 3  Friction and Shear 3  Braden Scale Score 22  Musculoskeletal  Musculoskeletal (WDL) WDL  Gastrointestinal  Gastrointestinal (WDL) WDL  GU Assessment  Genitourinary (WDL) WDL  Neurological  Level of Consciousness Alert   Alert/oriented. Makes needs/concerns known to staff. Pleasant cooperative with staff. Denies SI/HI/A/V hallucinations. Patient states went to group. Will encourage compliance and progression towards goals. Verbally contracted for safety. Will continue to monitor.

## 2022-11-20 LAB — GLUCOSE, CAPILLARY: Glucose-Capillary: 131 mg/dL — ABNORMAL HIGH (ref 70–99)

## 2022-11-20 LAB — PROLACTIN: Prolactin: 13.7 ng/mL (ref 4.8–33.4)

## 2022-11-20 MED ORDER — ENSURE ENLIVE PO LIQD
237.0000 mL | Freq: Two times a day (BID) | ORAL | Status: DC
  Administered 2022-11-20 – 2022-11-22 (×4): 237 mL via ORAL
  Filled 2022-11-20 (×8): qty 237

## 2022-11-20 MED ORDER — HYDROXYZINE HCL 50 MG PO TABS
50.0000 mg | ORAL_TABLET | Freq: Three times a day (TID) | ORAL | Status: DC | PRN
  Administered 2022-11-21 – 2022-11-22 (×2): 50 mg via ORAL
  Filled 2022-11-20 (×4): qty 1

## 2022-11-20 NOTE — Plan of Care (Signed)
  Problem: Safety: Goal: Periods of time without injury will increase Outcome: Progressing   

## 2022-11-20 NOTE — Progress Notes (Signed)
   11/20/22 2041  Charting Type  Charting Type Shift assessment  Safety Check Verification  Has the RN verified the 15 minute safety check completion? Yes  Neurological  Neuro (WDL) WDL  HEENT  HEENT (WDL) WDL  Respiratory  Respiratory (WDL) WDL  Cardiac  Cardiac (WDL) WDL  Vascular  Vascular (WDL) WDL  Integumentary  Integumentary (WDL) X  Braden Scale (Ages 8 and up)  Sensory Perceptions 4  Moisture 4  Activity 4  Mobility 4  Nutrition 3  Friction and Shear 3  Braden Scale Score 22  Musculoskeletal  Musculoskeletal (WDL) WDL  Gastrointestinal  Gastrointestinal (WDL) WDL  GU Assessment  Genitourinary (WDL) WDL  Neurological  Level of Consciousness Alert   Alert/oriented. Makes needs/concerns known to staff. Pleasant cooperative with staff. Denies SI/HI/A/V hallucinations. Patient states went to group. Will encourage compliance and progression towards goals. Verbally contracted for safety. Will continue to monitor.

## 2022-11-20 NOTE — [facility] Group Notes (Signed)
[facility] Group Notes:  (Nursing/MHT/Case Management/Adjunct)  Date:  11/20/2022  Time:  8:51 PM  Type of Therapy:   Wrap=up group  Participation Level:  Minimal  Participation Quality:  Appropriate  Affect:  Appropriate  Cognitive:  Appropriate  Insight:  Appropriate  Engagement in Group:  Engaged  Modes of Intervention:  Education  Summary of Progress/Problems: Pt states no goal. Day 5/10.  Noah Delaine 11/20/2022, 8:51 PM

## 2022-11-20 NOTE — [facility] Group Notes (Addendum)
Spiritual care group facilitated by Chaplain Dyanne Carrel, [facility]  Group focused on topic of strength. Group members reflected on what thoughts and feelings emerge when they hear this topic. They then engaged in facilitated dialog around how strength is present in their lives. This dialog focused on representing what strength had been to them in their lives (images and patterns given) and what they saw as helpful in their life now (what they needed / wanted).  Activity drew on narrative framework.  Patient Progress: Penny Berger attended group and participated in group conversation.  She shared about a significant loss in her life with the death of her father and how it has left her without reason to live.  Chaplain invited her to meet individually after group, but she decline.

## 2022-11-20 NOTE — Progress Notes (Signed)
Pt denied SI/HI/AVH this morning. Pt rated her depression a 10/10 and her anxiety a 10/10. Pt reports; "my depression and anxiety are always high". Patient presents with a depressed/irritable affect. Pt has been cooperative throughout the shift. Pt given scheduled medications as prescribed. Q15 min checks verified for safety. Patient verbally contracts for safety. Patient compliant with medications and treatment plan. Patient is interacting well on the unit. Pt is safe on the unit.   11/20/22 0900  Psych Admission Type (Psych Patients Only)  Admission Status Voluntary  Psychosocial Assessment  Patient Complaints Anxiety;Depression  Eye Contact Brief  Facial Expression Flat  Affect Anxious;Sad  Speech Logical/coherent  Interaction Assertive  Motor Activity Slow  Appearance/Hygiene Unremarkable  Behavior Characteristics Cooperative;Appropriate to situation  Mood Depressed;Anxious  Thought Process  Coherency WDL  Content WDL  Delusions None reported or observed  Perception WDL  Hallucination None reported or observed  Judgment Impaired  Confusion None  Danger to Self  Current suicidal ideation? Denies  Description of Suicide Plan No plan  Self-Injurious Behavior No self-injurious ideation or behavior indicators observed or expressed   Agreement Not to Harm Self Yes  Description of Agreement Pt verbally contracts for safety  Danger to Others  Danger to Others None reported or observed

## 2022-11-20 NOTE — Progress Notes (Addendum)
[facility] MD Progress Note  Date: 11/20/2022 12:30 PM Name: Penny Berger  DOB: 02-05-1976 MRN: [id number] Unit: [id number]  CC: active SI  REASON FOR ADMISSION   Penny Berger is a 47 y.o. female with a past history of depression, anxiety and panic disorder insomnia and suicidal ideations who also has a history of pseudoseizures. She presented herself voluntarily accompanied by her mother with complaints of suicidal ideations and worsening depression   Principal Problem: MDD (major depressive disorder), recurrent severe, without psychosis (HCC) Diagnosis: Principal Problem:   MDD (major depressive disorder), recurrent severe, without psychosis (HCC)  CHART REVIEW FROM LAST 24 HOURS   Adherent to scheduled meds: yes  Agitation PRNs: No Per nursing staff: no behavioral concerns, but rated 10/10 for depression and anxiety Groups: 1/3 Documented sleep last 24 hours: 7.5   SUBJECTIVE  Patient was initially seen in the dayroom, no acute distress. Patient was engaged during evaluation.  Reported groggy side effects to either trazodone or abilify. Denied cravings for cocaine or nicotine.  Mood:Depressed, Anxious, Hopeless - unchanged. Requested that we increase her prozac, however discussed that it would have to be increased slowly Sleep:Good (with prn trazodone and scheduled abilify) Appetite: Fair - however only eats half of the meal because she doesn't like the food here, requested ensure SI:Yes, Passive - goes to bed hoping she won't wake up. Denied plan or intent HI:No WUJ:WJXB  Review of Systems  Respiratory:  Negative for shortness of breath.   Cardiovascular:  Negative for chest pain and palpitations.  Gastrointestinal:  Negative for abdominal pain, constipation, diarrhea, nausea and vomiting.  Neurological:  Positive for headaches. Negative for dizziness.   HISTORY  Past Psychiatric History:  Patient has a history of major depression, panic attacks and pseudoseizures.  She was last hospitalized in November 2023. She has not been compliant with outpatient treatment.  Prior Inpatient Therapy: Yes.   If yes, describe admitted in November 2023. Prior Outpatient Therapy: Yes.   If yes, describe noncompliant.  Substance Use History: Vape to smoke marijuana daily and also uses cocaine. She does not perceive MJ as an issue.   Family Psychiatric History:  Unknown at this time   Social History:  Housing: She lives with her parents. She has 1 son who was 39 year old  Finances: CNA full-time   Past Medical History:  Past Medical History:  Diagnosis Date   Seizures (HCC)    Syncope     Past Surgical History:  Procedure Laterality Date   APPENDECTOMY     CESAREAN SECTION     Family History:  History reviewed. No pertinent family history. Social History:  Social History   Substance and Sexual Activity  Alcohol Use Yes     Social History   Substance and Sexual Activity  Drug Use Yes   Types: Cocaine, Marijuana    Social History   Socioeconomic History   Marital status: Single    Spouse name: Not on file   Number of children: Not on file   Years of education: Not on file   Highest education level: Not on file  Occupational History   Not on file  Tobacco Use   Smoking status: Every Day    Packs/day: .5    Types: Cigarettes   Smokeless tobacco: Never  Vaping Use   Vaping Use: Never used  Substance and Sexual Activity   Alcohol use: Yes   Drug use: Yes    Types: Cocaine, Marijuana   Sexual activity: Yes  Other  Topics Concern   Not on file  Social History Narrative   Not on file   Social Determinants of Health   Financial Resource Strain: Not on file  Food Insecurity: No Food Insecurity (11/18/2022)   Hunger Vital Sign    Worried About Running Out of Food in the Last Year: Never true    Ran Out of Food in the Last Year: Never true  Transportation Needs: No Transportation Needs (11/18/2022)   PRAPARE - Scientist, research (physical sciences) (Medical): No    Lack of Transportation (Non-Medical): No  Physical Activity: Not on file  Stress: Not on file  Social Connections: Not on file   Additional Social History:                         Current Medications: Current Facility-Administered Medications  Medication Dose Route Frequency Provider Last Rate Last Admin   acetaminophen (TYLENOL) tablet 650 mg  650 mg Oral Q6H PRN Ardis Hughs, NP       alum & mag hydroxide-simeth (MAALOX/MYLANTA) 200-200-20 MG/5ML suspension 30 mL  30 mL Oral Q4H PRN Ardis Hughs, NP       bisacodyl (DULCOLAX) EC tablet 5 mg  5 mg Oral Daily PRN Rex Kras, MD   5 mg at 11/19/22 1423   diphenhydrAMINE (BENADRYL) capsule 50 mg  50 mg Oral TID PRN Ardis Hughs, NP       Or   diphenhydrAMINE (BENADRYL) injection 50 mg  50 mg Intramuscular TID PRN Ardis Hughs, NP       feeding supplement (ENSURE ENLIVE / ENSURE PLUS) liquid 237 mL  237 mL Oral BID BM Princess Bruins, DO       FLUoxetine (PROZAC) capsule 20 mg  20 mg Oral Daily Rex Kras, MD   20 mg at 11/20/22 0755   haloperidol (HALDOL) tablet 5 mg  5 mg Oral TID PRN Ardis Hughs, NP       Or   haloperidol lactate (HALDOL) injection 5 mg  5 mg Intramuscular TID PRN Ardis Hughs, NP       hydrOXYzine (ATARAX) tablet 50 mg  50 mg Oral TID PRN Princess Bruins, DO       LORazepam (ATIVAN) tablet 2 mg  2 mg Oral TID PRN Ardis Hughs, NP       Or   LORazepam (ATIVAN) injection 2 mg  2 mg Intramuscular TID PRN Ardis Hughs, NP       magnesium hydroxide (MILK OF MAGNESIA) suspension 30 mL  30 mL Oral Daily PRN Ardis Hughs, NP       naphazoline-glycerin (CLEAR EYES REDNESS) ophth solution 1-2 drop  1-2 drop Both Eyes QID PRN Rex Kras, MD   2 drop at 11/20/22 1105   nicotine (NICODERM CQ - dosed in mg/24 hours) patch 14 mg  14 mg Transdermal Daily Massengill, Harrold Donath, MD   14 mg at 11/20/22 0756   traZODone (DESYREL)  tablet 50 mg  50 mg Oral QHS PRN Ardis Hughs, NP   50 mg at 11/19/22 2146    OBJECTIVE  BP 113/81 (BP Location: Left Arm)   Pulse (!) 123   Temp 98.7 F (37.1 C) (Oral)   Resp 18   Ht 5\' 4"  (1.626 m)   Wt 75.8 kg   SpO2 98%   BMI 28.67 kg/m   Physical Exam Physical Exam Vitals and nursing note reviewed.  Constitutional:  General: She is awake. She is not in acute distress.    Appearance: She is not ill-appearing or diaphoretic.  HENT:     Head: Normocephalic.  Eyes:     Conjunctiva/sclera:     Right eye: Right conjunctiva is injected.     Left eye: Left conjunctiva is injected.  Pulmonary:     Effort: Pulmonary effort is normal. No respiratory distress.  Neurological:     Mental Status: She is alert.     AIMS:   ,  ,  ,  ,      Psychiatric Specialty Exam: Presentation General Appearance: Appropriate for Environment; Casual; Fairly Groomed  Eye Contact: Good  Speech: Clear and Coherent; Normal Rate  Volume: Normal  Handedness:Right   Mood and Affect  Mood: Depressed; Anxious; Hopeless  Affect: Congruent; Appropriate; Depressed; Restricted; Tearful   Thought Process  Thought Process: Coherent; Goal Directed; Linear  Descriptions of Associations: Intact   Thought Content Suicidal Thoughts:Yes, Passive With Plan; With Intent Without Intent; Without Plan  Homicidal Thoughts:No  Hallucinations:None  Ideas of Reference:None  Thought Content:Rumination; Perseveration; Scattered   Sensorium  Memory:Immediate Good  Judgment:Fair  Insight:Shallow   Executive Functions  Orientation:Full (Time, Place and Person)  Language:Good  Concentration:Good  Attention:Good  Recall:Good  Fund of Knowledge:Good   Psychomotor Activity  Psychomotor Activity:Decreased; Psychomotor Retardation   Assets  Assets:Communication Skills; Desire for Improvement; Housing   Sleep  Quality:Good (with prn trazodone and scheduled  abilify)  Documented sleep last 24 hours: 7.5   Lab Results:  No results found for this or any previous visit (from the past 48 hour(s)).  Blood Alcohol level:  Lab Results  Component Value Date   ETH <10 11/17/2022   ETH <10 05/02/2022    Metabolic Disorder Labs: Lab Results  Component Value Date   HGBA1C 6.1 (H) 11/17/2022   MPG 128 11/17/2022   MPG 116.89 05/02/2022   Lab Results  Component Value Date   PROLACTIN 13.7 11/17/2022   PROLACTIN 5.0 05/02/2022   Lab Results  Component Value Date   CHOL 229 (H) 11/17/2022   TRIG 113 11/17/2022   HDL 37 (L) 11/17/2022   CHOLHDL 6.2 11/17/2022   VLDL 23 11/17/2022   LDLCALC 169 (H) 11/17/2022   LDLCALC 154 (H) 05/02/2022   ASSESSMENT / PLAN  Diagnoses / Active Problems: Principal Problem:   MDD (major depressive disorder), recurrent severe, without psychosis (HCC)   Safety and Monitoring: Voluntary admission to inpatient psychiatric unit for safety, stabilization and treatment     2. Psychiatric Diagnoses and Treatment:  MDD Still passively suicidal Discontinued home Effexor. Continued Prozac 20 mg a day Hold Abilify 2 mg at night  3. Medical Issues Being Addressed:   Pre-diabetes CBGs BID  4. Discharge Planning:  Tentative Date: TBA  Barrier: SI Location: likely home with family Outpatient rehab referral  Treatment Plan Summary: I certify that inpatient services furnished can reasonably be expected to improve the patient's condition.   Daily contact with patient to assess and evaluate symptoms and progress in treatment and Medication management Daily contact with patient to assess and evaluate symptoms and progress in treatment Patient's case to be discussed in multi-disciplinary team meeting Observation Level: q15 minute checks  Vital signs: q12 hours Precautions: suicide, elopement, and assault The risks/benefits/side-effects/alternatives to this medication were discussed in detail with the  patient and time was given for questions. The patient consents to medication trial. The patient consents to medication trial. FDA black box warnings, if  present, were discussed. Metabolic profile and EKG monitoring obtained while on an atypical antipsychotic  Encouraged patient to participate in unit milieu and in scheduled group therapies  Short Term Goals: Ability to identify changes in lifestyle to reduce recurrence of condition will improve, Ability to verbalize feelings will improve, Ability to disclose and discuss suicidal ideas, Ability to demonstrate self-control will improve, Ability to identify and develop effective coping behaviors will improve, Ability to maintain clinical measurements within normal limits will improve, Compliance with prescribed medications will improve, and Ability to identify triggers associated with substance abuse/mental health issues will improve Long Term Goals: Improvement in symptoms so as ready for discharge Social work and case management to assist with discharge planning and identification of hospital follow-up needs prior to discharge Estimated LOS: 5-7 days Discharge Concerns: Need to establish a safety plan; Medication compliance and effectiveness Discharge Goals: Return home with outpatient referrals for mental health follow-up including medication management/psychotherapy  Total Time spent with patient:  Patient's case was discussed with Attending, see attestation for more information.   Signed: Princess Bruins, DO Psychiatry Resident, PGY-2 [facility] - Adult  [address] [place], [place] [address] Ph: [phone] Fax: [phone]

## 2022-11-21 LAB — GLUCOSE, CAPILLARY
Glucose-Capillary: 104 mg/dL — ABNORMAL HIGH (ref 70–99)
Glucose-Capillary: 121 mg/dL — ABNORMAL HIGH (ref 70–99)

## 2022-11-21 MED ORDER — PROPRANOLOL HCL 10 MG PO TABS: 10.0000 mg | ORAL_TABLET | Freq: Every day | ORAL | Status: DC | PRN

## 2022-11-21 MED ORDER — PROPRANOLOL HCL 10 MG PO TABS
10.0000 mg | ORAL_TABLET | Freq: Every day | ORAL | Status: DC | PRN
  Administered 2022-11-21 – 2022-11-22 (×2): 10 mg via ORAL
  Filled 2022-11-21 (×2): qty 1

## 2022-11-21 MED ORDER — TRAZODONE HCL 50 MG PO TABS
50.0000 mg | ORAL_TABLET | Freq: Every day | ORAL | Status: DC
  Administered 2022-11-21 – 2022-11-22 (×2): 50 mg via ORAL
  Filled 2022-11-21 (×3): qty 1

## 2022-11-21 NOTE — [facility] Suicide Risk Assessment (Signed)
[facility] INPATIENT:  Family/Significant Other Suicide Prevention Education  Suicide Prevention Education:  Education Completed; Grafton Folk, mother, [phone] has been identified by the patient as the family member/significant other with whom the patient will be residing, and identified as the person(s) who will aid the patient in the event of a mental health crisis (suicidal ideations/suicide attempt).  With written consent from the patient, the family member/significant other has been provided the following suicide prevention education, prior to the and/or following the discharge of the patient.  The suicide prevention education provided includes the following: Suicide risk factors Suicide prevention and interventions National Suicide Hotline telephone number [facility] assessment telephone number [facility] Emergency Assistance 911 [facility] and/or Residential Mobile Crisis Unit telephone number  Request made of family/significant other to: Remove weapons (e.g., guns, rifles, knives), all items previously/currently identified as safety concern.   Remove drugs/medications (over-the-counter, prescriptions, illicit drugs), all items previously/currently identified as a safety concern.  The family member/significant other verbalizes understanding of the suicide prevention education information provided.  The family member/significant other agrees to remove the items of safety concern listed above.  Corky Crafts 11/21/2022, 3:49 PM

## 2022-11-21 NOTE — Group Note (Signed)
Recreation Therapy Group Note   Group Topic:Other  Group Date: 11/21/2022 Start Time: 1300 End Time: 1400 Facilitators: Thor Nannini-McCall, LRT,CTRS Location: [address] Dayroom   Activity Description/Intervention: Therapeutic Drumming. Patients with peers and staff were given the opportunity to engage in a leader facilitated [facility] Empowerment Drumming Circle with staff from the [employer], in partnership with [employer]. Teaching laboratory technician and trained [employer], Theodoro Doing leading with LRT observing and documenting intervention and pt response. This evidenced-based practice targets 7 areas of health and wellbeing in the human experience including: stress-reduction, exercise, self-expression, camaraderie/support, nurturing, spirituality, and music-making (leisure).   Goal Area(s) Addresses:  Patient will engage in pro-social way in music group.  Patient will follow directions of drum leader on the first prompt. Patient will demonstrate no behavioral issues during group.  Patient will identify if a reduction in stress level occurs as a result of participation in therapeutic drum circle.    Affect/Mood: Flat   Participation Level: Moderate   Participation Quality: Independent   Behavior: Appropriate   Speech/Thought Process: Focused   Insight: Moderate   Judgement: Moderate   Modes of Intervention: Teaching laboratory technician   Patient Response to Interventions:  Receptive   Education Outcome:  Acknowledges education   Clinical Observations/Individualized Feedback: Penny Berger actively engaged in therapeutic drumming exercise and discussions. Pt was appropriate with peers, staff, and musical equipment for duration of programming.  Pt identified "mellow" as their feeling after participation in music-based programming. Pt affect congruent/incongruent with verbalized emotion.    Plan: Continue to engage patient in RT group sessions  2-3x/week.   Penny Berger, LRT,CTRS 11/21/2022 2:35 PM

## 2022-11-21 NOTE — Progress Notes (Signed)
[facility] MD Progress Note  Date: 11/21/2022 6:57 PM Name: Penny Berger  DOB: 12-19-1975 MRN: [id number] Unit: [id number]/[id number]  CC: active SI  REASON FOR ADMISSION   Penny Berger is a 47 y.o. female with a past history of depression, anxiety and panic disorder insomnia and suicidal ideations who also has a history of pseudoseizures. She presented herself voluntarily accompanied by her mother with complaints of suicidal ideations and worsening depression   Principal Problem: MDD (major depressive disorder), recurrent severe, without psychosis (HCC) Diagnosis: Principal Problem:   MDD (major depressive disorder), recurrent severe, without psychosis (HCC)  CHART REVIEW FROM LAST 24 HOURS   Adherent to scheduled meds: yes  Agitation PRNs: No Per nursing staff: No acute events overnight Documented sleep last 24 hours: 7.75   SUBJECTIVE  Patient was initially seen in the dayroom, no acute distress. Patient was engaged during evaluation.  Reported improves groggy side effects.  Stated that she has not had any tearful episodes, stated that she has about 2-3X a week.  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  Discussed multiple options for PRN anxiolytic, especially take during work time.  Her biggest concern is daytime somnolence.  Discussed with her propranolol, which she is amenable to starting after discussing the risk, benefits, side effects.  Mood:Depressed, Anxious, Hopeless -improving, with improved sleep and resolution of nighttime ruminating with trazodone.  Sleep:Good (with prn trazodone and scheduled abilify) Appetite: Fair -Ensure is gross, but she still drinks it SI:Yes, Passive -still wishes that she would not wake up in the morning, however she is much less anxious about it now because trazodone does not give her the chance to ruminate on the thought. HI:No WUJ:WJXB  Review of Systems  Respiratory:  Negative for shortness of  breath.   Cardiovascular:  Negative for chest pain and palpitations.  Gastrointestinal:  Negative for abdominal pain, constipation, diarrhea, nausea and vomiting.  Neurological:  Positive for headaches. Negative for dizziness.   HISTORY  Past Psychiatric History:  Patient has a history of major depression, panic attacks and pseudoseizures. She was last hospitalized in November 2023. She has not been compliant with outpatient treatment.  Prior Inpatient Therapy: Yes.   If yes, describe admitted in November 2023. Prior Outpatient Therapy: Yes.   If yes, describe noncompliant.  Substance Use History: Vape to smoke marijuana daily and also uses cocaine. She does not perceive MJ as an issue.   Family Psychiatric History:  Unknown at this time   Social History:  Housing: She lives with her parents. She has 1 son who was 23 year old  Finances: CNA full-time   Past Medical History:  Past Medical History:  Diagnosis Date   Seizures (HCC)    Syncope     Past Surgical History:  Procedure Laterality Date   APPENDECTOMY     CESAREAN SECTION     Family History:  History reviewed. No pertinent family history. Social History:  Social History   Substance and Sexual Activity  Alcohol Use Yes     Social History   Substance and Sexual Activity  Drug Use Yes   Types: Cocaine, Marijuana    Social History   Socioeconomic History   Marital status: Single    Spouse name: Not on file   Number of children: Not on file   Years of education: Not on file   Highest education level: Not on file  Occupational History   Not on file  Tobacco Use   Smoking status: Every Day    Packs/day: .5    Types: Cigarettes   Smokeless tobacco: Never  Vaping Use   Vaping Use: Never used  Substance and Sexual Activity   Alcohol use: Yes   Drug use: Yes    Types: Cocaine, Marijuana   Sexual activity: Yes  Other Topics Concern   Not on file  Social History Narrative   Not on file   Social  Determinants of Health   Financial Resource Strain: Not on file  Food Insecurity: No Food Insecurity (11/18/2022)   Hunger Vital Sign    Worried About Running Out of Food in the Last Year: Never true    Ran Out of Food in the Last Year: Never true  Transportation Needs: No Transportation Needs (11/18/2022)   PRAPARE - Administrator, Civil Service (Medical): No    Lack of Transportation (Non-Medical): No  Physical Activity: Not on file  Stress: Not on file  Social Connections: Not on file   Additional Social History:                         Current Medications: Current Facility-Administered Medications  Medication Dose Route Frequency Provider Last Rate Last Admin   acetaminophen (TYLENOL) tablet 650 mg  650 mg Oral Q6H PRN Ardis Hughs, NP       alum & mag hydroxide-simeth (MAALOX/MYLANTA) 200-200-20 MG/5ML suspension 30 mL  30 mL Oral Q4H PRN Ardis Hughs, NP       bisacodyl (DULCOLAX) EC tablet 5 mg  5 mg Oral Daily PRN Rex Kras, MD   5 mg at 11/21/22 0732   diphenhydrAMINE (BENADRYL) capsule 50 mg  50 mg Oral TID PRN Ardis Hughs, NP       Or   diphenhydrAMINE (BENADRYL) injection 50 mg  50 mg Intramuscular TID PRN Ardis Hughs, NP       feeding supplement (ENSURE ENLIVE / ENSURE PLUS) liquid 237 mL  237 mL Oral BID BM Princess Bruins, DO   237 mL at 11/21/22 1429   FLUoxetine (PROZAC) capsule 20 mg  20 mg Oral Daily Rex Kras, MD   20 mg at 11/21/22 0731   haloperidol (HALDOL) tablet 5 mg  5 mg Oral TID PRN Ardis Hughs, NP       Or   haloperidol lactate (HALDOL) injection 5 mg  5 mg Intramuscular TID PRN Ardis Hughs, NP       hydrOXYzine (ATARAX) tablet 50 mg  50 mg Oral TID PRN Princess Bruins, DO       LORazepam (ATIVAN) tablet 2 mg  2 mg Oral TID PRN Ardis Hughs, NP       Or   LORazepam (ATIVAN) injection 2 mg  2 mg Intramuscular TID PRN Ardis Hughs, NP       magnesium hydroxide (MILK OF  MAGNESIA) suspension 30 mL  30 mL Oral Daily PRN Ardis Hughs, NP       nicotine (NICODERM CQ - dosed in mg/24 hours) patch 14 mg  14 mg Transdermal Daily Massengill, Harrold Donath, MD   14 mg at 11/21/22 0732   propranolol (INDERAL) tablet 10 mg  10 mg Oral Daily PRN Princess Bruins, DO   10 mg at 11/21/22 1202   traZODone (DESYREL) tablet 50 mg  50 mg Oral QHS Rex Kras, MD        OBJECTIVE  BP 100/83  Pulse 92   Temp 98.7 F (37.1 C) (Oral)   Resp 18   Ht 5\' 4"  (1.626 m)   Wt 75.8 kg   SpO2 99%   BMI 28.67 kg/m   Physical Exam Physical Exam Vitals and nursing note reviewed.  Constitutional:      General: She is awake. She is not in acute distress.    Appearance: She is not ill-appearing or diaphoretic.  HENT:     Head: Normocephalic.  Eyes:     Conjunctiva/sclera:     Right eye: Right conjunctiva is injected.     Left eye: Left conjunctiva is injected.  Pulmonary:     Effort: Pulmonary effort is normal. No respiratory distress.  Neurological:     Mental Status: She is alert.     AIMS:   ,  ,  ,  ,      Psychiatric Specialty Exam: Presentation General Appearance: Appropriate for Environment; Casual; Fairly Groomed  Eye Contact: Good  Speech: Clear and Coherent; Normal Rate  Volume: Normal  Handedness:Right   Mood and Affect  Mood: Depressed; Anxious; Hopeless  Affect: Congruent; Appropriate; Depressed; Restricted; Tearful   Thought Process  Thought Process: Coherent; Goal Directed; Linear  Descriptions of Associations: Intact   Thought Content Suicidal Thoughts:Yes, Passive With Plan; With Intent Without Intent; Without Plan  Homicidal Thoughts:No  Hallucinations:None  Ideas of Reference:None  Thought Content:Rumination; Perseveration; Scattered   Sensorium  Memory:Immediate Good  Judgment:Fair  Insight:Shallow   Executive Functions  Orientation:Full (Time, Place and  Person)  Language:Good  Concentration:Good  Attention:Good  Recall:Good  Fund of Knowledge:Good   Psychomotor Activity  Psychomotor Activity:Decreased; Psychomotor Retardation   Assets  Assets:Communication Skills; Desire for Improvement; Housing   Sleep  Quality:Good (with prn trazodone and scheduled abilify)  Documented sleep last 24 hours: 7.75   Lab Results:  Results for orders placed or performed during the hospital encounter of 11/18/22 (from the past 48 hour(s))  Glucose, capillary     Status: Abnormal   Collection Time: 11/20/22  6:03 PM  Result Value Ref Range   Glucose-Capillary 131 (H) 70 - 99 mg/dL    Comment: Glucose reference range applies only to samples taken after fasting for at least 8 hours.  Glucose, capillary     Status: Abnormal   Collection Time: 11/21/22  5:43 AM  Result Value Ref Range   Glucose-Capillary 104 (H) 70 - 99 mg/dL    Comment: Glucose reference range applies only to samples taken after fasting for at least 8 hours.  Glucose, capillary     Status: Abnormal   Collection Time: 11/21/22  6:07 PM  Result Value Ref Range   Glucose-Capillary 121 (H) 70 - 99 mg/dL    Comment: Glucose reference range applies only to samples taken after fasting for at least 8 hours.    Blood Alcohol level:  Lab Results  Component Value Date   ETH <10 11/17/2022   ETH <10 05/02/2022    Metabolic Disorder Labs: Lab Results  Component Value Date   HGBA1C 6.1 (H) 11/17/2022   MPG 128 11/17/2022   MPG 116.89 05/02/2022   Lab Results  Component Value Date   PROLACTIN 13.7 11/17/2022   PROLACTIN 5.0 05/02/2022   Lab Results  Component Value Date   CHOL 229 (H) 11/17/2022   TRIG 113 11/17/2022   HDL 37 (L) 11/17/2022   CHOLHDL 6.2 11/17/2022   VLDL 23 11/17/2022   LDLCALC 169 (H) 11/17/2022   LDLCALC 154 (H) 05/02/2022  ASSESSMENT / PLAN  Diagnoses / Active Problems: Principal Problem:   MDD (major depressive disorder), recurrent severe,  without psychosis (HCC)   Safety and Monitoring: Voluntary admission to inpatient psychiatric unit for safety, stabilization and treatment     2. Psychiatric Diagnoses and Treatment:  MDD  Still passively suicidal.  However subjective depression and anxiety has improved.  However she is about returning to work and having tearful episodes with symptoms of feeling on edge, sweaty hands, heart racing -  PRN per below Discontinued home Effexor. Continued Prozac 20 mg a day STARTED propranolol 10 mg daily as needed  3. Medical Issues Being Addressed:   Pre-diabetes CBGs BID  4. Discharge Planning:  Tentative Date: 6/8-02/2023 Location: likely home with family Outpatient rehab referral  Treatment Plan Summary: I certify that inpatient services furnished can reasonably be expected to improve the patient's condition.   Daily contact with patient to assess and evaluate symptoms and progress in treatment and Medication management Daily contact with patient to assess and evaluate symptoms and progress in treatment Patient's case to be discussed in multi-disciplinary team meeting Observation Level: q15 minute checks  Vital signs: q12 hours Precautions: suicide, elopement, and assault The risks/benefits/side-effects/alternatives to this medication were discussed in detail with the patient and time was given for questions. The patient consents to medication trial. The patient consents to medication trial. FDA black box warnings, if present, were discussed. Metabolic profile and EKG monitoring obtained while on an atypical antipsychotic  Encouraged patient to participate in unit milieu and in scheduled group therapies  Short Term Goals: Ability to identify changes in lifestyle to reduce recurrence of condition will improve, Ability to verbalize feelings will improve, Ability to disclose and discuss suicidal ideas, Ability to demonstrate self-control will improve, Ability to identify and develop  effective coping behaviors will improve, Ability to maintain clinical measurements within normal limits will improve, Compliance with prescribed medications will improve, and Ability to identify triggers associated with substance abuse/mental health issues will improve Long Term Goals: Improvement in symptoms so as ready for discharge Social work and case management to assist with discharge planning and identification of hospital follow-up needs prior to discharge Estimated LOS: 5-7 days Discharge Concerns: Need to establish a safety plan; Medication compliance and effectiveness Discharge Goals: Return home with outpatient referrals for mental health follow-up including medication management/psychotherapy  Total Time spent with patient:  Patient's case was discussed with Attending, see attestation for more information.   Signed: Princess Bruins, DO Psychiatry Resident, PGY-2 [facility] - [facility]  [address] [place], [place] [address] Ph: [phone] Fax: [phone]

## 2022-11-21 NOTE — Progress Notes (Signed)
Pt did attend AA group and actively participated.

## 2022-11-21 NOTE — Plan of Care (Signed)
  Problem: Education: Goal: Emotional status will improve Outcome: Progressing   Problem: Education: Goal: Mental status will improve Outcome: Progressing   Problem: Education: Goal: Verbalization of understanding the information provided will improve Outcome: Progressing   

## 2022-11-21 NOTE — Progress Notes (Addendum)
Pt denied SI/HI/AVH this morning. Pt rated her depression a 7/10 and anxiety her a 7/10. PRN Propanolol administered for anxiety. Patient complains of constipation this morning, reporting that she was able to have small bowel movement yesterday but still feels constipated. PRN Dulcolax administered per patient request. Pt has been pleasant, calm, and cooperative throughout the shift. RN provided support and encouragement to patient. Pt given scheduled medications as prescribed. Q15 min checks verified for safety. Patient verbally contracts for safety. Patient compliant with medications and treatment plan. Patient is interacting well on the unit. Pt is safe on the unit.   11/21/22 0856  Psych Admission Type (Psych Patients Only)  Admission Status Voluntary  Psychosocial Assessment  Patient Complaints Anxiety;Depression  Eye Contact Brief  Facial Expression Flat  Affect Anxious;Sad  Speech Logical/coherent  Interaction Assertive  Motor Activity Slow  Appearance/Hygiene Unremarkable  Behavior Characteristics Cooperative;Appropriate to situation  Mood Depressed;Anxious  Thought Process  Coherency WDL  Content WDL  Delusions None reported or observed  Perception WDL  Hallucination None reported or observed  Judgment Impaired  Confusion None  Danger to Self  Current suicidal ideation? Denies  Description of Suicide Plan No plan  Self-Injurious Behavior No self-injurious ideation or behavior indicators observed or expressed   Agreement Not to Harm Self Yes  Description of Agreement Pt verbally contracts for safety  Danger to Others  Danger to Others None reported or observed

## 2022-11-21 NOTE — Group Note (Signed)
Date:  11/21/2022 Time:  6:52 PM  Group Topic/Focus:  Support and Check-in- Open Discussion/Journaling    Participation Level:  Active  Participation Quality:  Appropriate  Affect:  Appropriate  Cognitive:  Appropriate  Insight: Appropriate  Engagement in Group:  Engaged  Modes of Intervention:  Activity, Discussion, and Support  Additional Comments:   Pt  attended and actively participated in the support check in, journaling and open discussion group.  Penny Berger 11/21/2022, 6:52 PM

## 2022-11-21 NOTE — Group Note (Signed)
Date:  11/21/2022 Time:  4:25 PM  Group Topic/Focus:  Goals Group:   The focus of this group is to help patients establish daily goals to achieve during treatment and discuss how the patient can incorporate goal setting into their daily lives to aide in recovery. Orientation:   The focus of this group is to educate the patient on the purpose and policies of crisis stabilization and provide a format to answer questions about their admission.  The group details unit policies and expectations of patients while admitted.    Participation Level:  Active  Participation Quality:  Appropriate  Affect:  Appropriate  Cognitive:  Appropriate  Insight: Appropriate  Engagement in Group:  Engaged  Modes of Intervention:  Activity, Discussion, and Orientation  Additional Comments:   Pt attended and participated in the Orientation/Goals group.  Penny Berger 11/21/2022, 4:25 PM

## 2022-11-21 NOTE — Group Note (Signed)
Date:  11/21/2022 Time:  2:38 PM  Group Topic/Focus:  Emotional Education:   The focus of this group is to discuss what feelings/emotions are, and how they are experienced.    Participation Level:  Active  Participation Quality:  Appropriate  Affect:  Appropriate  Cognitive:  Alert  Insight: Appropriate  Engagement in Group:  Engaged  Modes of Intervention:  Discussion  Additional Comments:    Beckie Busing 11/21/2022, 2:38 PM

## 2022-11-21 NOTE — Progress Notes (Signed)
   11/21/22 2200  Psych Admission Type (Psych Patients Only)  Admission Status Voluntary  Psychosocial Assessment  Patient Complaints Anxiety  Eye Contact Brief  Facial Expression Flat  Affect Anxious;Sad  Speech Logical/coherent  Interaction Assertive  Motor Activity Slow  Appearance/Hygiene Unremarkable  Behavior Characteristics Cooperative;Appropriate to situation  Mood Depressed;Anxious  Thought Process  Coherency WDL  Content WDL  Delusions None reported or observed  Perception WDL  Hallucination None reported or observed  Judgment Impaired  Confusion None  Danger to Self  Current suicidal ideation? Denies  Self-Injurious Behavior No self-injurious ideation or behavior indicators observed or expressed   Agreement Not to Harm Self Yes  Description of Agreement Verbal  Danger to Others  Danger to Others None reported or observed

## 2022-11-22 LAB — GLUCOSE, CAPILLARY: Glucose-Capillary: 112 mg/dL — ABNORMAL HIGH (ref 70–99)

## 2022-11-22 NOTE — Group Note (Signed)
Date:  11/22/2022 Time:  4:45 PM  Group Topic/Focus:  Dimensions of Wellness:   The focus of this group is to introduce the topic of wellness and discuss the role each dimension of wellness plays in total health.    Participation Level:  Active  Participation Quality:  Appropriate  Affect:  Appropriate  Cognitive:  Appropriate  Insight: Appropriate  Engagement in Group:  Engaged  Modes of Intervention:  Activity and Discussion  Additional Comments:   Pt attended and participated in the Social Wellness Group.   Edmund Hilda Karisma Meiser 11/22/2022, 4:45 PM

## 2022-11-22 NOTE — Progress Notes (Signed)
   11/22/22 2250  Psych Admission Type (Psych Patients Only)  Admission Status Voluntary  Psychosocial Assessment  Patient Complaints Anxiety  Eye Contact Brief  Facial Expression Flat  Affect Appropriate to circumstance  Speech Logical/coherent  Interaction Assertive  Motor Activity Other (Comment) (WDL)  Appearance/Hygiene Unremarkable  Behavior Characteristics Appropriate to situation  Mood Depressed  Thought Process  Coherency WDL  Content WDL  Delusions None reported or observed  Perception WDL  Hallucination None reported or observed  Judgment Impaired  Confusion None  Danger to Self  Current suicidal ideation? Denies  Self-Injurious Behavior No self-injurious ideation or behavior indicators observed or expressed   Agreement Not to Harm Self Yes  Description of Agreement verbal  Danger to Others  Danger to Others None reported or observed

## 2022-11-22 NOTE — Progress Notes (Addendum)
D. Pt continues to present with a flat affect/ depressed mood- was mildly irritated  this am when told that she may not go home today. Per pt's self inventory, pt rated her depression,hopelessness and anxiety a 6/5/6, respectively. Pt's stated goal today was "to work on learning to love and accept myself." Pt has been visible in the milieu observed attending groups.  Pt currently denies SI/HI and AVH  A. Labs and vitals monitored. Pt given and educated on medications. Pt supported emotionally and encouraged to express concerns and ask questions.   R. Pt remains safe with 15 minute checks. Will continue POC.    11/22/22 0900  Psych Admission Type (Psych Patients Only)  Admission Status Voluntary  Psychosocial Assessment  Patient Complaints Anxiety;Depression  Eye Contact Brief  Facial Expression Anxious  Affect Appropriate to circumstance  Speech Logical/coherent  Interaction Assertive  Motor Activity Slow  Appearance/Hygiene Unremarkable  Behavior Characteristics Cooperative;Appropriate to situation  Mood Irritable  Thought Process  Coherency WDL  Content WDL  Delusions None reported or observed  Perception WDL  Hallucination None reported or observed  Judgment Impaired  Confusion None  Danger to Self  Current suicidal ideation? Denies  Self-Injurious Behavior No self-injurious ideation or behavior indicators observed or expressed   Agreement Not to Harm Self Yes  Description of Agreement verbal contract for safety  Danger to Others  Danger to Others None reported or observed

## 2022-11-22 NOTE — Progress Notes (Signed)
[facility] MD Progress Note  Date: 11/22/2022 11:00 AM Name: Penny Berger  DOB: 25-Jun-1975 MRN: [id number] Unit: [id number]/[id number]  CC: active SI  REASON FOR ADMISSION   Penny Berger is a 47 y.o. female with a past history of depression, anxiety and panic disorder insomnia and suicidal ideations who also has a history of pseudoseizures. She presented herself voluntarily accompanied by her mother with complaints of suicidal ideations and worsening depression   Principal Problem: MDD (major depressive disorder), recurrent severe, without psychosis (HCC) Diagnosis: Principal Problem:   MDD (major depressive disorder), recurrent severe, without psychosis (HCC)  CHART REVIEW FROM LAST 24 HOURS   Adherent to scheduled meds: yes  Agitation PRNs: Patient received propranolol, hydroxyzine and Colace as as needed last night. Per nursing staff: No acute events overnight Documented sleep last 24 hours: 7.75   SUBJECTIVE  Patient was initially seen in the dayroom, no acute distress. Patient was engaged during evaluation. Patient was noted to be mildly irritable and anxious today.  Staff reports that patient has been noted to be labile and focused on discharge.  She claims that she has not had any more tearful episodes.  She also reports that she is concerned about her job and wants to go back to work either on Monday or Wednesday.  She has had some daytime somnolence which seems to be improving.  She took as needed propranolol for anxiety and has not had any side effects.  However she is insistent on discharge this weekend.  Mood:Anxious -appears to be improving but is irritable and focused on discharge. Sleep: Good Appetite: Fair -and taking Ensure as needed. SI:No -still has passive SI. HI:No WUJ:[id number]  Review of Systems  Respiratory:  Negative for shortness of breath.   Cardiovascular:  Negative for chest pain and palpitations.  Gastrointestinal:  Negative for abdominal pain, constipation,  diarrhea, nausea and vomiting.  Neurological:  Positive for headaches. Negative for dizziness.   HISTORY  Past Psychiatric History:  Patient has a history of major depression, panic attacks and pseudoseizures. She was last hospitalized in November 2023. She has not been compliant with outpatient treatment.  Prior Inpatient Therapy: Yes.   If yes, describe admitted in November 2023. Prior Outpatient Therapy: Yes.   If yes, describe noncompliant.  Substance Use History: Vape to smoke marijuana daily and also uses cocaine. She does not perceive MJ as an issue.   Family Psychiatric History:  Unknown at this time   Social History:  Housing: She lives with her parents. She has 1 son who was 47 year old  Finances: CNA full-time   Past Medical History:  Past Medical History:  Diagnosis Date   Seizures (HCC)    Syncope     Past Surgical History:  Procedure Laterality Date   APPENDECTOMY     CESAREAN SECTION     Family History:  History reviewed. No pertinent family history. Social History:  Social History   Substance and Sexual Activity  Alcohol Use Yes     Social History   Substance and Sexual Activity  Drug Use Yes   Types: Cocaine, Marijuana    Social History   Socioeconomic History   Marital status: Single    Spouse name: Not on file   Number of children: Not on file   Years of education: Not on file   Highest education level: Not on file  Occupational History   Not on file  Tobacco Use   Smoking status: Every Day    Packs/day: .5  Types: Cigarettes   Smokeless tobacco: Never  Vaping Use   Vaping Use: Never used  Substance and Sexual Activity   Alcohol use: Yes   Drug use: Yes    Types: Cocaine, Marijuana   Sexual activity: Yes  Other Topics Concern   Not on file  Social History Narrative   Not on file   Social Determinants of Health   Financial Resource Strain: Not on file  Food Insecurity: No Food Insecurity (11/18/2022)   Hunger Vital Sign     Worried About Running Out of Food in the Last Year: Never true    Ran Out of Food in the Last Year: Never true  Transportation Needs: No Transportation Needs (11/18/2022)   PRAPARE - Administrator, Civil Service (Medical): No    Lack of Transportation (Non-Medical): No  Physical Activity: Not on file  Stress: Not on file  Social Connections: Not on file   Additional Social History:                         Current Medications: Current Facility-Administered Medications  Medication Dose Route Frequency Provider Last Rate Last Admin   acetaminophen (TYLENOL) tablet 650 mg  650 mg Oral Q6H PRN Ardis Hughs, NP       alum & mag hydroxide-simeth (MAALOX/MYLANTA) 200-200-20 MG/5ML suspension 30 mL  30 mL Oral Q4H PRN Ardis Hughs, NP       bisacodyl (DULCOLAX) EC tablet 5 mg  5 mg Oral Daily PRN Rex Kras, MD   5 mg at 11/21/22 0732   diphenhydrAMINE (BENADRYL) capsule 50 mg  50 mg Oral TID PRN Ardis Hughs, NP       Or   diphenhydrAMINE (BENADRYL) injection 50 mg  50 mg Intramuscular TID PRN Ardis Hughs, NP       feeding supplement (ENSURE ENLIVE / ENSURE PLUS) liquid 237 mL  237 mL Oral BID BM Princess Bruins, DO   237 mL at 11/22/22 0950   FLUoxetine (PROZAC) capsule 20 mg  20 mg Oral Daily Rex Kras, MD   20 mg at 11/22/22 4098   haloperidol (HALDOL) tablet 5 mg  5 mg Oral TID PRN Ardis Hughs, NP       Or   haloperidol lactate (HALDOL) injection 5 mg  5 mg Intramuscular TID PRN Ardis Hughs, NP       hydrOXYzine (ATARAX) tablet 50 mg  50 mg Oral TID PRN Princess Bruins, DO   50 mg at 11/21/22 2129   LORazepam (ATIVAN) tablet 2 mg  2 mg Oral TID PRN Ardis Hughs, NP       Or   LORazepam (ATIVAN) injection 2 mg  2 mg Intramuscular TID PRN Ardis Hughs, NP       magnesium hydroxide (MILK OF MAGNESIA) suspension 30 mL  30 mL Oral Daily PRN Ardis Hughs, NP       nicotine (NICODERM CQ - dosed in mg/24 hours)  patch 14 mg  14 mg Transdermal Daily Massengill, Harrold Donath, MD   14 mg at 11/22/22 0826   propranolol (INDERAL) tablet 10 mg  10 mg Oral Daily PRN Princess Bruins, DO   10 mg at 11/21/22 1202   traZODone (DESYREL) tablet 50 mg  50 mg Oral QHS Rex Kras, MD   50 mg at 11/21/22 2129    OBJECTIVE  BP 111/88 (BP Location: Left Arm)   Pulse (!) 115  Temp 98.4 F (36.9 C) (Oral)   Resp 18   Ht 5\' 4"  (1.626 m)   Wt 75.8 kg   SpO2 98%   BMI 28.67 kg/m   Physical Exam Physical Exam Vitals and nursing note reviewed.  Constitutional:      General: She is awake. She is not in acute distress.    Appearance: She is not ill-appearing or diaphoretic.  HENT:     Head: Normocephalic.  Eyes:     Conjunctiva/sclera:     Right eye: Right conjunctiva is injected.     Left eye: Left conjunctiva is injected.  Pulmonary:     Effort: Pulmonary effort is normal. No respiratory distress.  Neurological:     Mental Status: She is alert.     AIMS:   ,  ,  ,  ,      Psychiatric Specialty Exam: Presentation General Appearance: Disheveled  Eye Contact: Fair  Speech: Clear and Coherent  Volume: Normal  Handedness:Right   Mood and Affect  Mood: Anxious  Affect: Appropriate   Thought Process  Thought Process: Coherent  Descriptions of Associations: Intact   Thought Content Suicidal Thoughts:No With Plan; With Intent Without Intent; Without Plan  Homicidal Thoughts:No  Hallucinations:None  Ideas of Reference:None  Thought Content:Rumination; Perseveration   Sensorium  Memory:Immediate Fair; Remote Fair; Recent Fair  Judgment:Fair  Insight:Fair   Executive Functions  Orientation:Full (Time, Place and Person)  Language:Good  Concentration:Good  Attention:Good  Recall:Good  Fund of Knowledge:Good   Psychomotor Activity  Psychomotor Activity:Normal   Assets  Assets:Communication Skills; Desire for Improvement; Housing   Sleep   Quality:Fair  Documented sleep last 24 hours: 7.75   Lab Results:  Results for orders placed or performed during the hospital encounter of 11/18/22 (from the past 48 hour(s))  Glucose, capillary     Status: Abnormal   Collection Time: 11/20/22  6:03 PM  Result Value Ref Range   Glucose-Capillary 131 (H) 70 - 99 mg/dL    Comment: Glucose reference range applies only to samples taken after fasting for at least 8 hours.  Glucose, capillary     Status: Abnormal   Collection Time: 11/21/22  5:43 AM  Result Value Ref Range   Glucose-Capillary 104 (H) 70 - 99 mg/dL    Comment: Glucose reference range applies only to samples taken after fasting for at least 8 hours.  Glucose, capillary     Status: Abnormal   Collection Time: 11/21/22  6:07 PM  Result Value Ref Range   Glucose-Capillary 121 (H) 70 - 99 mg/dL    Comment: Glucose reference range applies only to samples taken after fasting for at least 8 hours.    Blood Alcohol level:  Lab Results  Component Value Date   ETH <10 11/17/2022   ETH <10 05/02/2022    Metabolic Disorder Labs: Lab Results  Component Value Date   HGBA1C 6.1 (H) 11/17/2022   MPG 128 11/17/2022   MPG 116.89 05/02/2022   Lab Results  Component Value Date   PROLACTIN 13.7 11/17/2022   PROLACTIN 5.0 05/02/2022   Lab Results  Component Value Date   CHOL 229 (H) 11/17/2022   TRIG 113 11/17/2022   HDL 37 (L) 11/17/2022   CHOLHDL 6.2 11/17/2022   VLDL 23 11/17/2022   LDLCALC 169 (H) 11/17/2022   LDLCALC 154 (H) 05/02/2022   ASSESSMENT / PLAN  Diagnoses / Active Problems: Principal Problem:   MDD (major depressive disorder), recurrent severe, without psychosis (HCC)   Safety  and Monitoring: Voluntary admission to inpatient psychiatric unit for safety, stabilization and treatment     2. Psychiatric Diagnoses and Treatment:  MDD  Still passively suicidal.  However subjective depression and anxiety has improved.  However she is about returning to  work and having tearful episodes with symptoms of feeling on edge, sweaty hands, heart racing -  PRN per below Discontinued home Effexor. Continued Prozac 20 mg a day STARTED propranolol 10 mg daily as needed yesterday.  Will evaluate response  3. Medical Issues Being Addressed:   Pre-diabetes CBGs BID  4. Discharge Planning:  Tentative Date: 11/23/2022 Location: likely home with family Outpatient rehab referral  Treatment Plan Summary: I certify that inpatient services furnished can reasonably be expected to improve the patient's condition.   Daily contact with patient to assess and evaluate symptoms and progress in treatment and Medication management Daily contact with patient to assess and evaluate symptoms and progress in treatment Patient's case to be discussed in multi-disciplinary team meeting Observation Level: q15 minute checks  Vital signs: q12 hours Precautions: suicide, elopement, and assault The risks/benefits/side-effects/alternatives to this medication were discussed in detail with the patient and time was given for questions. The patient consents to medication trial. The patient consents to medication trial. FDA black box warnings, if present, were discussed. Metabolic profile and EKG monitoring obtained while on an atypical antipsychotic  Encouraged patient to participate in unit milieu and in scheduled group therapies  Short Term Goals: Ability to identify changes in lifestyle to reduce recurrence of condition will improve, Ability to verbalize feelings will improve, Ability to disclose and discuss suicidal ideas, Ability to demonstrate self-control will improve, Ability to identify and develop effective coping behaviors will improve, Ability to maintain clinical measurements within normal limits will improve, Compliance with prescribed medications will improve, and Ability to identify triggers associated with substance abuse/mental health issues will improve Long Term  Goals: Improvement in symptoms so as ready for discharge Social work and case management to assist with discharge planning and identification of hospital follow-up needs prior to discharge Estimated LOS: On Sunday, 11/23/2022 Discharge Concerns: Need to establish a safety plan; Medication compliance and effectiveness Discharge Goals: Return home with outpatient referrals for mental health follow-up including medication management/psychotherapy  Total Time spent with patient:  30 minutes  Signed: Rex Kras, MD [facility]  [address] [place], [place] [address] Ph: [phone] Fax: [phone] ID: Penny [facility], female   DOB: 07-May-1976, 47 y.o.   MRN: [id number]

## 2022-11-22 NOTE — Group Note (Signed)
Date:  11/22/2022 Time:  4:25 PM  Group Topic/Focus:  Goals Group:   The focus of this group is to help patients establish daily goals to achieve during treatment and discuss how the patient can incorporate goal setting into their daily lives to aide in recovery. Orientation:   The focus of this group is to educate the patient on the purpose and policies of crisis stabilization and provide a format to answer questions about their admission.  The group details unit policies and expectations of patients while admitted.    Participation Level:  Active  Participation Quality:  Appropriate  Affect:  Appropriate  Cognitive:  Appropriate  Insight: Appropriate  Engagement in Group:  Engaged  Modes of Intervention:  Activity, Discussion, and Orientation  Additional Comments:   Pt attended and participated in the Orientation/Goals Group.  Edmund Hilda Rayma Hegg 11/22/2022, 4:25 PM

## 2022-11-22 NOTE — Progress Notes (Signed)
   11/22/22 0601  15 Minute Checks  Location Bedroom  Visual Appearance Calm  Behavior Sleeping  Sleep (Behavioral Health Patients Only)  Calculate sleep? (Click Yes once per 24 hr at 0600 safety check) Yes  Documented sleep last 24 hours 7.75

## 2022-11-22 NOTE — Group Note (Signed)
Date:  11/22/2022 Time:  6:13 PM  Group Topic/Focus:  Support and Check-in-Open Discussion and Journaling    Participation Level:  Active  Participation Quality:  Appropriate  Affect:  Appropriate  Cognitive:  Appropriate  Insight: Appropriate  Engagement in Group:  Engaged  Modes of Intervention:  Activity, Discussion, and Support  Additional Comments:   Pt attended and participated in the Support Check in, journaling and open discussion group.  Penny Berger Penny Berger Penny Berger 11/22/2022, 6:13 PM

## 2022-11-22 NOTE — [facility] Group Notes (Signed)
Pt was informed via intercom of wrap up, but opted out of attending.

## 2022-11-23 DIAGNOSIS — F332 Major depressive disorder, recurrent severe without psychotic features: Principal | ICD-10-CM

## 2022-11-23 LAB — GLUCOSE, CAPILLARY: Glucose-Capillary: 106 mg/dL — ABNORMAL HIGH (ref 70–99)

## 2022-11-23 MED ORDER — NICOTINE 14 MG/24HR TD PT24: 14.0000 mg | MEDICATED_PATCH | Freq: Every day | TRANSDERMAL | 0 refills | Status: DC

## 2022-11-23 MED ORDER — HYDROXYZINE HCL 50 MG PO TABS: 50.0000 mg | ORAL_TABLET | Freq: Three times a day (TID) | ORAL | 0 refills | Status: DC | PRN

## 2022-11-23 MED ORDER — FLUOXETINE HCL 20 MG PO CAPS: 20.0000 mg | ORAL_CAPSULE | Freq: Every day | ORAL | 0 refills | Status: DC

## 2022-11-23 MED ORDER — PROPRANOLOL HCL 10 MG PO TABS: 10.0000 mg | ORAL_TABLET | Freq: Every day | ORAL | 0 refills | Status: DC | PRN

## 2022-11-23 MED ORDER — TRAZODONE HCL 50 MG PO TABS: 50.0000 mg | ORAL_TABLET | Freq: Every day | ORAL | 0 refills | Status: DC

## 2022-11-23 NOTE — Discharge Summary (Signed)
Physician Discharge Summary Note  Patient:  Penny Berger is an 47 y.o., female MRN:  [id number] DOB:  03/07/1976 Patient phone:  [phone] (home)  Patient address:   [address] [place] [address],  Total Time spent with patient: 30 minutes  Date of Admission:  11/18/2022 Date of Discharge: 11/23/2022  Reason for Admission:  Penny Berger is a 47 y.o. female with a past history of depression, anxiety and panic disorder insomnia and suicidal ideations who also has a history of pseudoseizures. She presented herself voluntarily accompanied by her mother with complaints of suicidal ideations and worsening depression   Principal Problem: MDD (major depressive disorder), recurrent severe, without psychosis (HCC) Discharge Diagnoses: Principal Problem:   MDD (major depressive disorder), recurrent severe, without psychosis (HCC)   Past Psychiatric History: Please see H&P  Past Medical History:  Past Medical History:  Diagnosis Date   Seizures (HCC)    Syncope     Past Surgical History:  Procedure Laterality Date   APPENDECTOMY     CESAREAN SECTION     Family History: History reviewed. No pertinent family history. Family Psychiatric  History: Please see H&P Social History:  Social History   Substance and Sexual Activity  Alcohol Use Yes     Social History   Substance and Sexual Activity  Drug Use Yes   Types: Cocaine, Marijuana    Social History   Socioeconomic History   Marital status: Single    Spouse name: Not on file   Number of children: Not on file   Years of education: Not on file   Highest education level: Not on file  Occupational History   Not on file  Tobacco Use   Smoking status: Every Day    Packs/day: .5    Types: Cigarettes   Smokeless tobacco: Never  Vaping Use   Vaping Use: Never used  Substance and Sexual Activity   Alcohol use: Yes   Drug use: Yes    Types: Cocaine, Marijuana   Sexual activity: Yes  Other Topics Concern    Not on file  Social History Narrative   Not on file   Social Determinants of Health   Financial Resource Strain: Not on file  Food Insecurity: No Food Insecurity (11/18/2022)   Hunger Vital Sign    Worried About Running Out of Food in the Last Year: Never true    Ran Out of Food in the Last Year: Never true  Transportation Needs: No Transportation Needs (11/18/2022)   PRAPARE - Administrator, Civil Service (Medical): No    Lack of Transportation (Non-Medical): No  Physical Activity: Not on file  Stress: Not on file  Social Connections: Not on file    Hospital Course: During the patient's hospitalization, patient had extensive initial psychiatric evaluation, and follow-up psychiatric evaluations every day.  Psychiatric diagnoses provided upon initial assessment: Major depression recurrent with suicidal ideations.  History of marijuana abuse.  Patient's psychiatric medications were adjusted on admission: Patient's antidepressant was changed and she was placed on fluoxetine 20 mg a day.  During the hospitalization, other adjustments were made to the patient's psychiatric medication regimen: For anxiety patient was given as needed Inderal 10 mg which she seems to tolerate with a positive response to the anxiety.  Patient's care was discussed during the interdisciplinary team meeting every day during the hospitalization.  The patient denied having side effects to prescribed psychiatric medication.  Gradually, patient started adjusting to milieu. The patient was evaluated  each day by a clinical provider to ascertain response to treatment. Improvement was noted by the patient's report of decreasing symptoms, improved sleep and appetite, affect, medication tolerance, behavior, and participation in unit programming.  Patient was asked each day to complete a self inventory noting mood, mental status, pain, new symptoms, anxiety and concerns.    Symptoms were reported as  significantly decreased or resolved completely by discharge.   On day of discharge, the patient reports that their mood is stable. The patient denied having suicidal thoughts for more than 48 hours prior to discharge.  Patient denies having homicidal thoughts.  Patient denies having auditory hallucinations.  Patient denies any visual hallucinations or other symptoms of psychosis. The patient was motivated to continue taking medication with a goal of continued improvement in mental health.   The patient reports their target psychiatric symptoms of depression and anxiety responded well to the psychiatric medications, and the patient reports overall benefit other psychiatric hospitalization. Supportive psychotherapy was provided to the patient. The patient also participated in regular group therapy while hospitalized. Coping skills, problem solving as well as relaxation therapies were also part of the unit programming.  Labs were reviewed with the patient, and abnormal results were discussed with the patient.  The patient is able to verbalize their individual safety plan to this provider.  # It is recommended to the patient to continue psychiatric medications as prescribed, after discharge from the hospital.    # It is recommended to the patient to follow up with your outpatient psychiatric provider and PCP.  # It was discussed with the patient, the impact of alcohol, drugs, tobacco have been there overall psychiatric and medical wellbeing, and total abstinence from substance use was recommended the patient.ed.  # Prescriptions provided or sent directly to preferred pharmacy at discharge. Patient agreeable to plan. Given opportunity to ask questions. Appears to feel comfortable with discharge.    # In the event of worsening symptoms, the patient is instructed to call the crisis hotline, 911 and or go to the nearest ED for appropriate evaluation and treatment of symptoms. To follow-up with primary care  provider for other medical issues, concerns and or health care needs  # Patient was discharged home with a plan to follow up as noted below.   Physical Findings: AIMS:  , ,  ,  ,    CIWA:    COWS:     Musculoskeletal: Strength & Muscle Tone: within normal limits Gait & Station: normal Patient leans: N/A   Psychiatric Specialty Exam:  Presentation  General Appearance:  Appropriate for Environment  Eye Contact: Good  Speech: Clear and Coherent  Speech Volume: Normal  Handedness: Right   Mood and Affect  Mood: Euthymic  Affect: Appropriate   Thought Process  Thought Processes: Coherent  Descriptions of Associations:Intact  Orientation:Full (Time, Place and Person)  Thought Content:Logical  History of Schizophrenia/Schizoaffective disorder:No  Duration of Psychotic Symptoms:No data recorded Hallucinations:Hallucinations: None  Ideas of Reference:None  Suicidal Thoughts:Suicidal Thoughts: No  Homicidal Thoughts:Homicidal Thoughts: No   Sensorium  Memory: Immediate Fair; Remote Fair; Recent Fair  Judgment: Fair  Insight: Good   Executive Functions  Concentration: Good  Attention Span: Good  Recall: Good  Fund of Knowledge: Good  Language: Good   Psychomotor Activity  Psychomotor Activity: Psychomotor Activity: Normal   Assets  Assets: Communication Skills; Desire for Improvement; Housing; Vocational/Educational   Sleep  Sleep: Sleep: Good    Physical Exam: Physical Exam Constitutional:  Appearance: Normal appearance.  Neurological:     General: No focal deficit present.     Mental Status: She is alert and oriented to person, place, and time. Mental status is at baseline.    Review of Systems  Psychiatric/Behavioral: Negative.    All other systems reviewed and are negative.  Blood pressure (!) 110/97, pulse (!) 106, temperature 98.3 F (36.8 C), temperature source Oral, resp. rate 14, height 5\' 4"   (1.626 m), weight 75.8 kg, SpO2 100 %. Body mass index is 28.67 kg/m.   Social History   Tobacco Use  Smoking Status Every Day   Packs/day: .5   Types: Cigarettes  Smokeless Tobacco Never   Tobacco Cessation:  A prescription for an FDA-approved tobacco cessation medication provided at discharge   Blood Alcohol level:  Lab Results  Component Value Date   [facility] <10 11/17/2022   ETH <10 05/02/2022    Metabolic Disorder Labs:  Lab Results  Component Value Date   HGBA1C 6.1 (H) 11/17/2022   MPG 128 11/17/2022   MPG 116.89 05/02/2022   Lab Results  Component Value Date   PROLACTIN 13.7 11/17/2022   PROLACTIN 5.0 05/02/2022   Lab Results  Component Value Date   CHOL 229 (H) 11/17/2022   TRIG 113 11/17/2022   HDL 37 (L) 11/17/2022   CHOLHDL 6.2 11/17/2022   VLDL 23 11/17/2022   LDLCALC 169 (H) 11/17/2022   LDLCALC 154 (H) 05/02/2022    See Psychiatric Specialty Exam and Suicide Risk Assessment completed by Attending Physician prior to discharge.  Discharge destination:  Home  Is patient on multiple antipsychotic therapies at discharge:  No   Has Patient had three or more failed trials of antipsychotic monotherapy by history:  No  Recommended Plan for Multiple Antipsychotic Therapies: NA  Discharge Instructions     Diet - low sodium heart healthy   Complete by: As directed    Increase activity slowly   Complete by: As directed       Allergies as of 11/23/2022   No Known Allergies      Medication List     STOP taking these medications    Brimonidine Tartrate 0.025 % Soln   venlafaxine XR 75 MG 24 hr capsule Commonly known as: EFFEXOR-XR       TAKE these medications      Indication  FLUoxetine 20 MG capsule Commonly known as: PROZAC Take 1 capsule (20 mg total) by mouth daily. Start taking on: November 24, 2022  Indication: Depression   hydrOXYzine 50 MG tablet Commonly known as: ATARAX Take 1 tablet (50 mg total) by mouth 3 (three) times  daily as needed for anxiety. What changed:  medication strength how much to take  Indication: Feeling Anxious   medroxyPROGESTERone 150 MG/ML injection Commonly known as: DEPO-PROVERA Inject 150 mg into the muscle every 3 (three) months.  Indication: Birth Control Treatment   nicotine 14 mg/24hr patch Commonly known as: NICODERM CQ - dosed in mg/24 hours Place 1 patch (14 mg total) onto the skin daily. Start taking on: November 24, 2022  Indication: Nicotine Addiction   propranolol 10 MG tablet Commonly known as: INDERAL Take 1 tablet (10 mg total) by mouth daily as needed (anxiety, irritability, anxiety before bed affecting sleep).  Indication: Feeling Anxious, Anxiety Related to Current Life Problems   traZODone 50 MG tablet Commonly known as: DESYREL Take 1 tablet (50 mg total) by mouth at bedtime.  Indication: Trouble Sleeping  Follow-up Information     [facility]. Go on 12/04/2022.   Specialty: Behavioral Health Why: You have an appointment for therapy services on 12/04/22 at 11:00 am, in person.You also have an appointment for medication management services on 12/09/22 at 11:00 am, in person. Contact information: [address] [place] [address] [phone]                Follow-up recommendations:  Activity:  As tolerated  Comments: The patient responded positively to the change in antidepressant to Prozac 20 mg a day.  She continued to have persistent anxiety and depression and this is without any trigger.  She has some free-floating anxiety regarding returning back to work and dealing with her chronic stress.  She agreed to take Inderal 10 mg as needed and took once a day for about 3 days with a positive response.  She also took hydroxyzine 50 mg as needed.  Her sleep is improved on trazodone 50 mg at night.  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  At the time of discharge she is alert oriented and cooperative and is contracting for safety.  Prognosis is fair to guarded depending on compliance with medications and abstinence from using any addictive drugs.  Signed: Rex Kras, MD 11/23/2022, 9:55 AM  Total Time Spent in Direct Patient Care:  I personally spent 30 minutes on the unit in direct patient care. The direct patient care time included face-to-face time with the patient, reviewing the patient's chart, communicating with other professionals, and coordinating care. Greater than 50% of this time was spent in counseling or coordinating care with the patient regarding goals of hospitalization, psycho-education, and discharge planning needs.   Rulon Eisenmenger [facility] Psychiatrist

## 2022-11-23 NOTE — Progress Notes (Addendum)
D. Pt presents much brighter today, stated that she was looking forward to being discharged. Pt has been visible in the milieu interacting well with peers and attending groups.Pt currently denies SI/HI and AVH   A. Labs and vitals monitored. Pt given and educated on medications. Pt supported emotionally and encouraged to express concerns and ask questions.   R. Pt remains safe with 15 minute checks. Will continue POC.    11/23/22 0900  Psych Admission Type (Psych Patients Only)  Admission Status Voluntary  Psychosocial Assessment  Patient Complaints None  Eye Contact Brief  Facial Expression Anxious  Affect Appropriate to circumstance  Speech Logical/coherent  Interaction Assertive  Motor Activity Other (Comment) (steady gait)  Appearance/Hygiene Unremarkable  Behavior Characteristics Cooperative;Appropriate to situation  Mood Anxious;Pleasant  Thought Process  Coherency WDL  Content WDL  Delusions None reported or observed  Perception WDL  Hallucination None reported or observed  Judgment Impaired  Confusion None  Danger to Self  Current suicidal ideation? Denies  Self-Injurious Behavior No self-injurious ideation or behavior indicators observed or expressed   Agreement Not to Harm Self Yes  Description of Agreement verbal contract for safety  Danger to Others  Danger to Others None reported or observed

## 2022-11-23 NOTE — Progress Notes (Signed)
  [facility] Adult Case Management Discharge Plan :  Will you be returning to the same living situation after discharge:  Yes,  Patient will be returning home with her mother At discharge, do you have transportation home?: Yes,  Patient's mother to provide transportation Do you have the ability to pay for your medications: Yes,  Patient has insurance to cover cost of medications  Release of information consent forms completed and in the chart;  Patient's signature needed at discharge.  Patient to Follow up at:  Follow-up Information     [facility]. Go on 12/04/2022.   Specialty: Behavioral Health Why: You have an appointment for therapy services on 12/04/22 at 11:00 am, in person.You also have an appointment for medication management services on 12/09/22 at 11:00 am, in person. Contact information: [address] [place] [address] [phone]                Next level of care provider has access to [facility] Link:no  Safety Planning and Suicide Prevention discussed: Yes,  completed with patient's mother, Shon Millet     Has patient been referred to the Quitline?: Patient does not use tobacco/nicotine products  Patient has been referred for addiction treatment: No known substance use disorder.  [address] [place], [place] 11/23/2022, 10:20 AM

## 2022-11-23 NOTE — Progress Notes (Signed)
Pt discharged to lobby.Mother present to pick up patient. Pt was stable and appreciative at that time. All papers and electronic prescriptions were given and valuables returned. Suicide safety plan completed. Verbal understanding expressed. Denies SI/HI and A/VH. Pt given opportunity to express concerns and ask questions

## 2022-11-23 NOTE — BHH Suicide Risk Assessment (Signed)
[facility] Discharge Suicide Risk Assessment   Principal Problem: MDD (major depressive disorder), recurrent severe, without psychosis (HCC) Discharge Diagnoses: Principal Problem:   MDD (major depressive disorder), recurrent severe, without psychosis (HCC)   Total Time spent with patient: 30 minutes  Musculoskeletal: Strength & Muscle Tone: within normal limits Gait & Station: normal Patient leans: N/A  Psychiatric Specialty Exam  Presentation  General Appearance:  Disheveled  Eye Contact: Fair  Speech: Clear and Coherent  Speech Volume: Normal  Handedness: Right   Mood and Affect  Mood: Anxious  Duration of Depression Symptoms: Greater than two weeks  Affect: Appropriate   Thought Process  Thought Processes: Coherent  Descriptions of Associations:Intact  Orientation:Full (Time, Place and Person)  Thought Content:Rumination; Perseveration  History of Schizophrenia/Schizoaffective disorder:No  Duration of Psychotic Symptoms:No data recorded Hallucinations:Hallucinations: None  Ideas of Reference:None  Suicidal Thoughts:Suicidal Thoughts: No  Homicidal Thoughts:Homicidal Thoughts: No   Sensorium  Memory: Immediate Fair; Remote Fair; Recent Fair  Judgment: Fair  Insight: Fair   Art therapist  Concentration: Good  Attention Span: Good  Recall: Good  Fund of Knowledge: Good  Language: Good   Psychomotor Activity  Psychomotor Activity: Psychomotor Activity: Normal   Assets  Assets: Communication Skills; Desire for Improvement; Housing   Sleep  Sleep: Sleep: Fair   Physical Exam: Physical Exam Vitals and nursing note reviewed.  Constitutional:      Appearance: Normal appearance.  Neurological:     General: No focal deficit present.     Mental Status: She is alert and oriented to person, place, and time. Mental status is at baseline.    Review of Systems  Psychiatric/Behavioral: Negative.    All other  systems reviewed and are negative.  Blood pressure (!) 110/97, pulse (!) 106, temperature 98.3 F (36.8 C), temperature source Oral, resp. rate 14, height 5\' 4"  (1.626 m), weight 75.8 kg, SpO2 100 %. Body mass index is 28.67 kg/m.  Mental Status Per Nursing Assessment::   On Admission:  Suicidal ideation indicated by patient  Demographic Factors:  NA  Loss Factors: NA  Historical Factors: Impulsivity  Risk Reduction Factors:   Sense of responsibility to family, Employed, and Living with another person, especially a relative  Continued Clinical Symptoms:  Depression:   Impulsivity Alcohol/Substance Abuse/Dependencies  Cognitive Features That Contribute To Risk:  None    Suicide Risk:  Mild:  Suicidal ideation of limited frequency, intensity, duration, and specificity.  There are no identifiable plans, no associated intent, mild dysphoria and related symptoms, good self-control (both objective and subjective assessment), few other risk factors, and identifiable protective factors, including available and accessible social support.   Follow-up Information     [facility]. Go on 12/04/2022.   Specialty: Behavioral Health Why: You have an appointment for therapy services on 12/04/22 at 11:00 am, in person.You also have an appointment for medication management services on 12/09/22 at 11:00 am, in person. Contact information: [address] [place] [address] [phone]                Plan Of Care/Follow-up recommendations:  Activity:  As tolerated  Rex Kras, MD 11/23/2022, 9:52 AM

## 2022-12-04 ENCOUNTER — Ambulatory Visit (HOSPITAL_COMMUNITY): Payer: PRIVATE HEALTH INSURANCE | Admitting: Licensed Clinical Social Worker

## 2022-12-09 ENCOUNTER — Ambulatory Visit (HOSPITAL_COMMUNITY): Payer: PRIVATE HEALTH INSURANCE | Admitting: Psychiatry

## 2023-11-15 IMAGING — US US PELVIS COMPLETE TRANSABD/TRANSVAG W DUPLEX AND/OR DOPPLER
1 series · 14 of 25 positions shown · non-contrast
Comparison: None.

CLINICAL DATA: Left adnexal pain. History of C-section.
Premenopausal.

EXAM:
TRANSABDOMINAL ULTRASOUND OF PELVIS
DOPPLER ULTRASOUND OF OVARIES
TECHNIQUE: Transabdominal ultrasound examination of the pelvis was performed
including evaluation of the uterus, ovaries, adnexal regions, and
pelvic cul-de-sac.
Color and duplex Doppler ultrasound was utilized to evaluate blood
flow to the ovaries.

[Series 1: us pelvic complete w transvaginal and torsion righ · 14 of 44 slices shown]
[im 1/44]
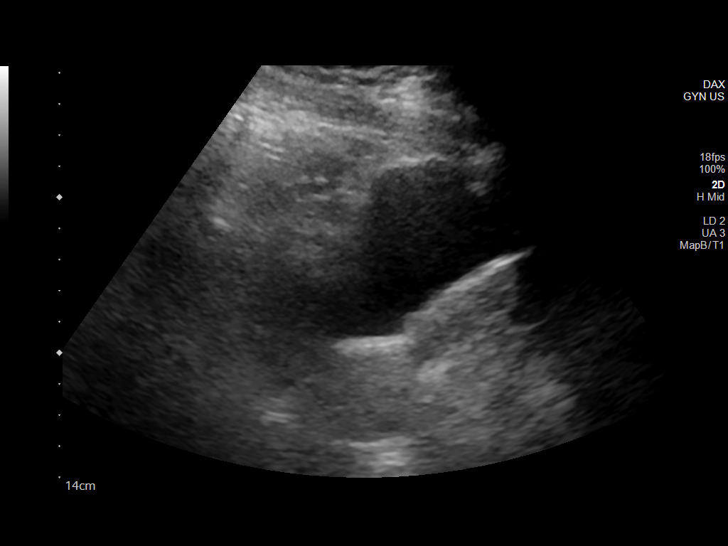
[im 4/44]
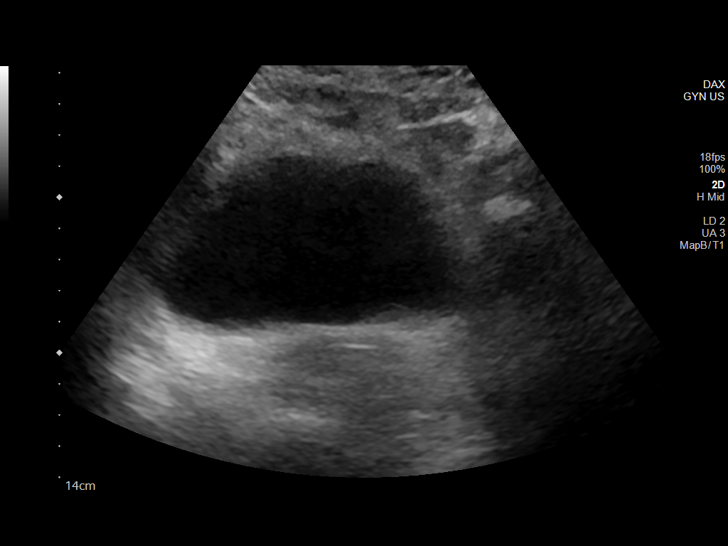
[im 8/44]
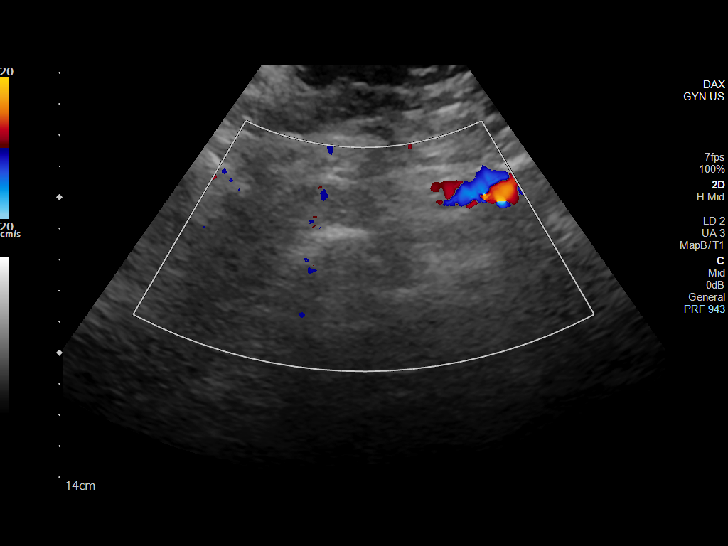
[im 11/44]
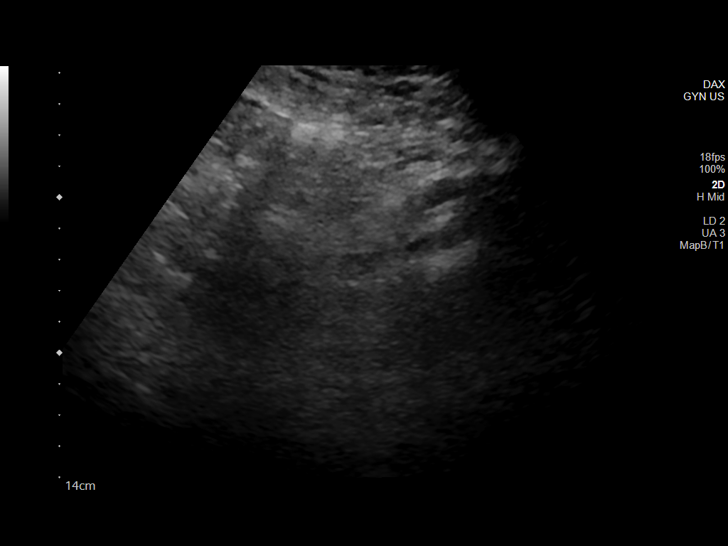
[im 15/44]
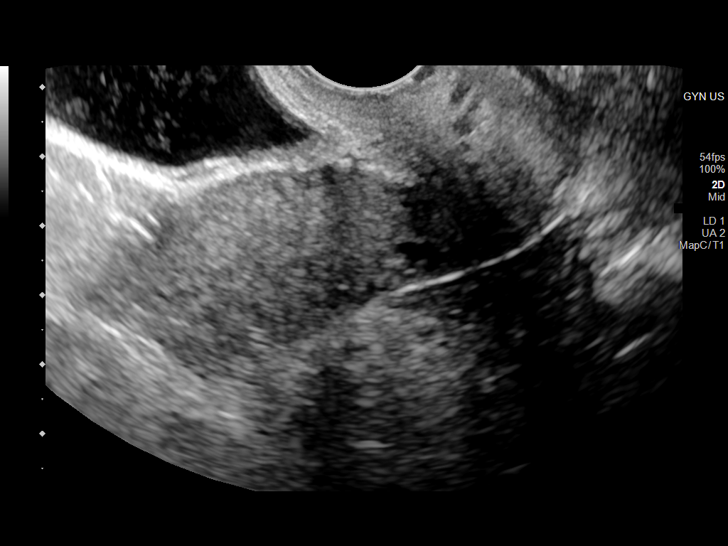
[im 17/44]
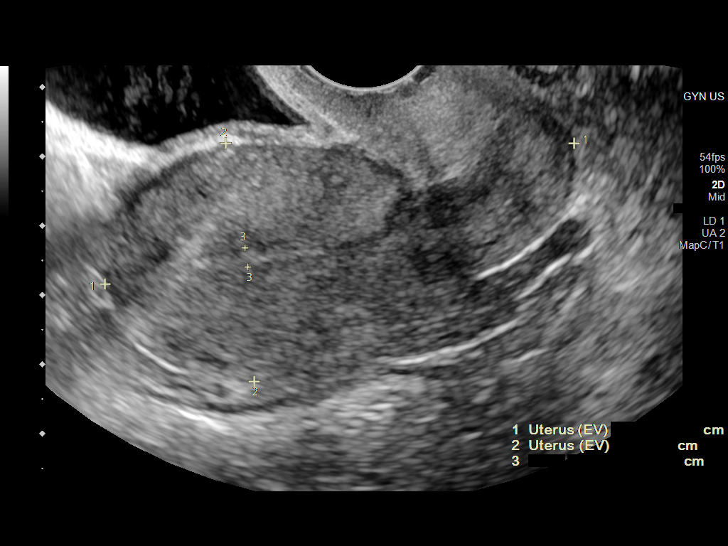
[im 20/44]
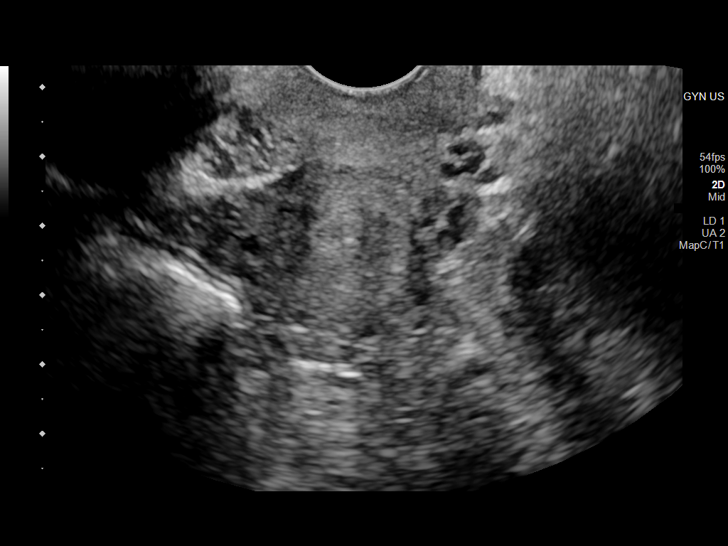
[im 24/44]
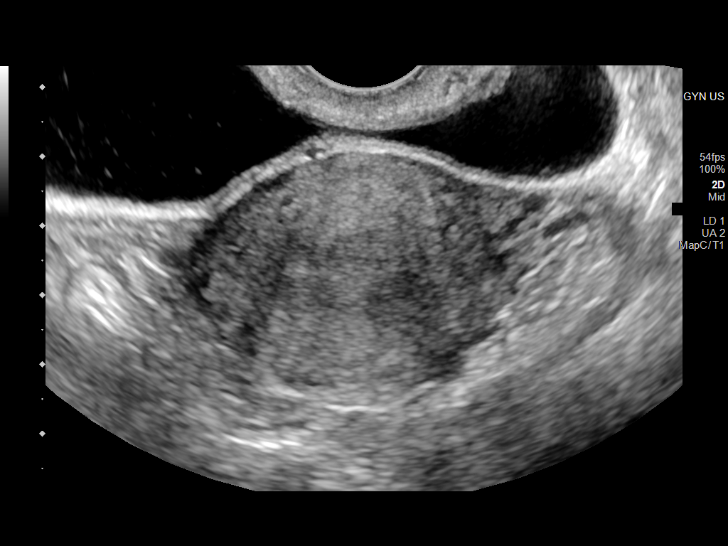
[im 27/44]
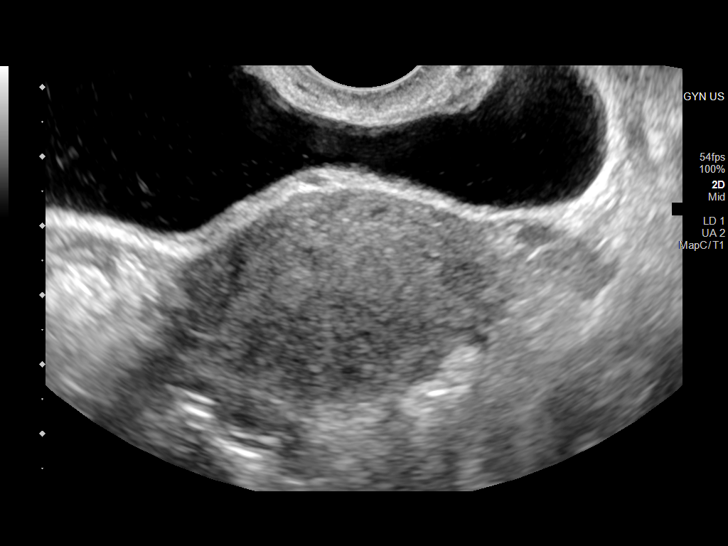
[im 29/44]
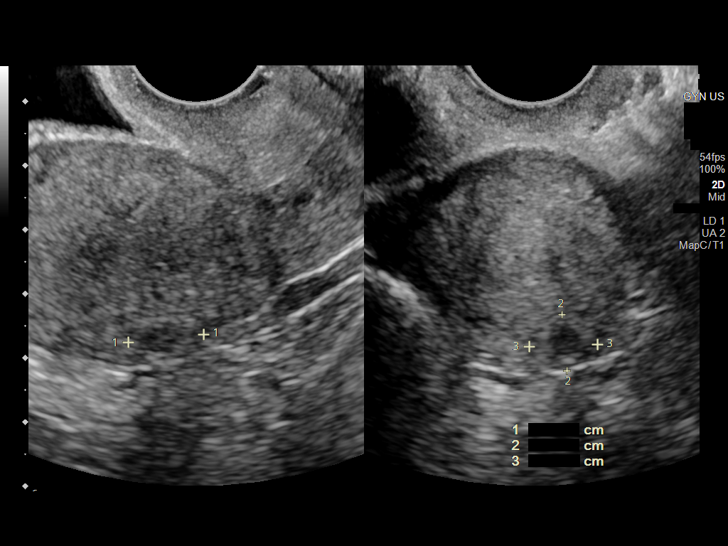
[im 33/44]
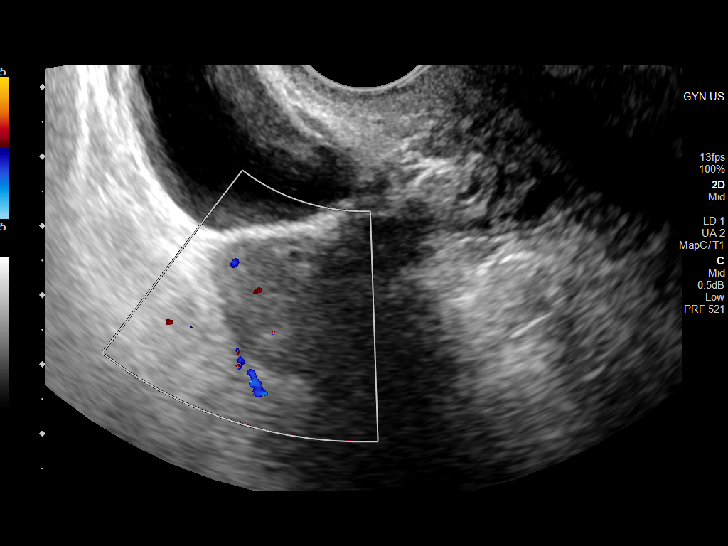
[im 36/44]
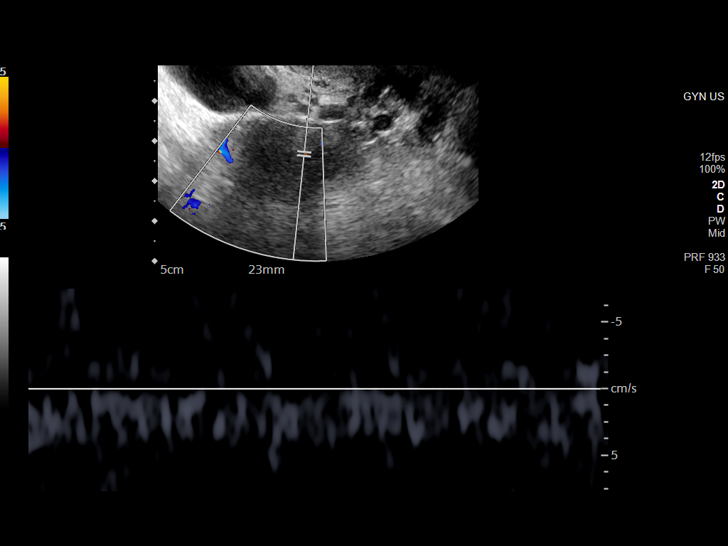
[im 40/44]
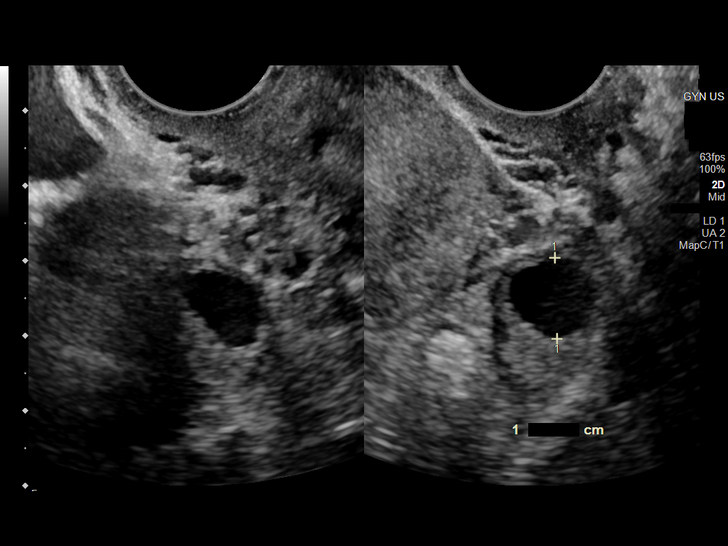
[im 44/44]
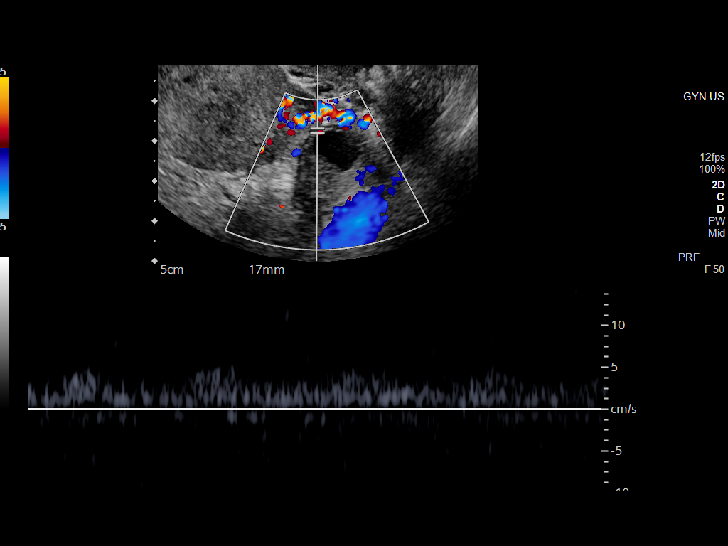

[14 of 25 positions shown; findings below may reference images not displayed]

FINDINGS: Uterus

Measurements: 7.1 x 3.5 x 4.6 cm = volume: 58 mL. There is a 1.2 x
0.9 x 1.1 cm hypoechoic posterior uterine wall lesion likely
representing a fibroid.

Endometrium

Thickness: 3mm.  No focal abnormality visualized.

Right ovary

Measurements: 3 x 2.1 x 2.1 cm = volume: 6.6 mL. Normal
appearance/no adnexal mass.

Left ovary

Measurements: 2.6 x 2.1 x 1.7 cm = volume: 4.6 mL. Normal
appearance/no adnexal mass.

Pulsed Doppler evaluation demonstrates normal low-resistance
arterial and venous waveforms in both ovaries.

Other: No free fluid identified.
IMPRESSION: 1. A 1.2 cm intramural fibroid.
2. Otherwise unremarkable pelvic ultrasound.

## 2023-11-15 IMAGING — CT CT ABD-PELV W/ CM
2 of 5 series · 16 of 46 positions shown, 18 images · IV contrast (APPLIED)
Comparison: CT abdomen and pelvis 09/09/2018.

CLINICAL DATA: Left lower quadrant abdominal pain.

EXAM:
CT ABDOMEN AND PELVIS WITH CONTRAST
TECHNIQUE: Multidetector CT imaging of the abdomen and pelvis was performed
using the standard protocol following bolus administration of
intravenous contrast.

[Series 2: abd pel w · axial · 0.73mm/px · z∈[-370,+55]mm · 13 of 95 slices shown, 15 images]
[im 5/95  soft-tissue]
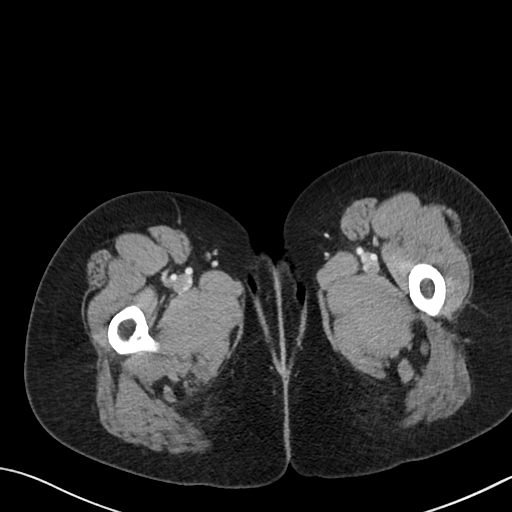
[im 5/95  bone]
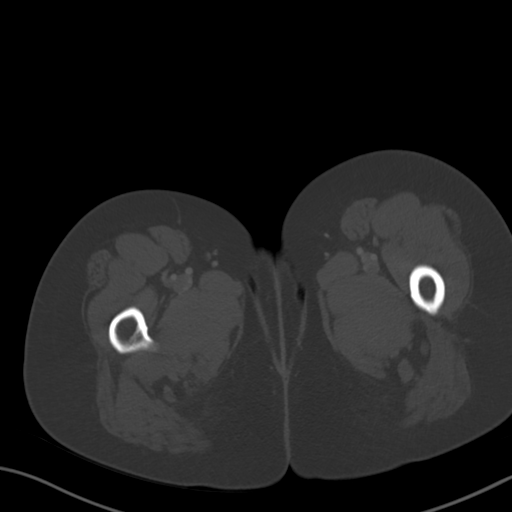
[im 15/95  soft-tissue]
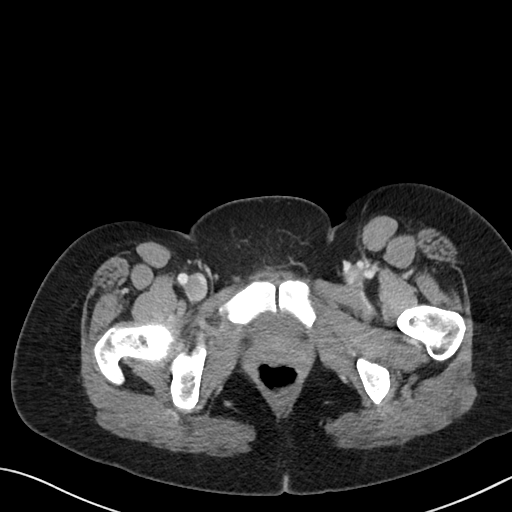
[im 20/95  soft-tissue]
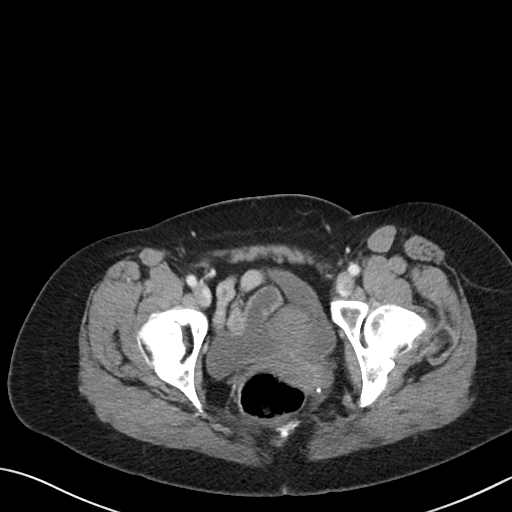
[im 25/95  soft-tissue]
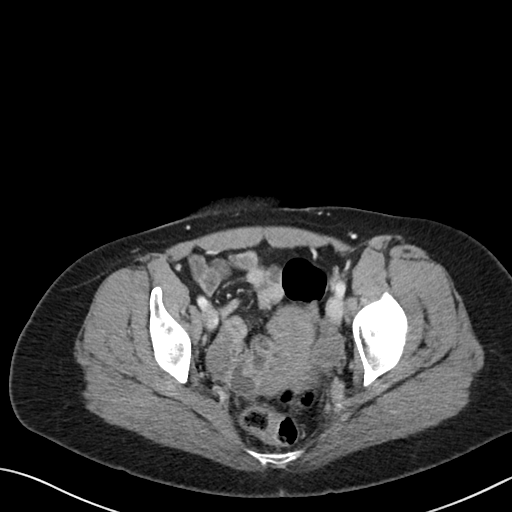
[im 35/95  soft-tissue]
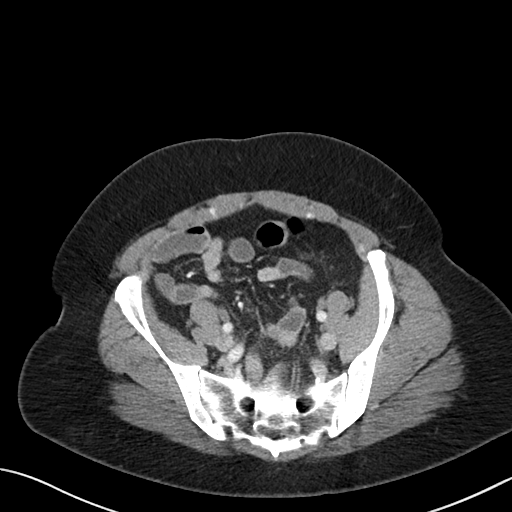
[im 40/95  soft-tissue]
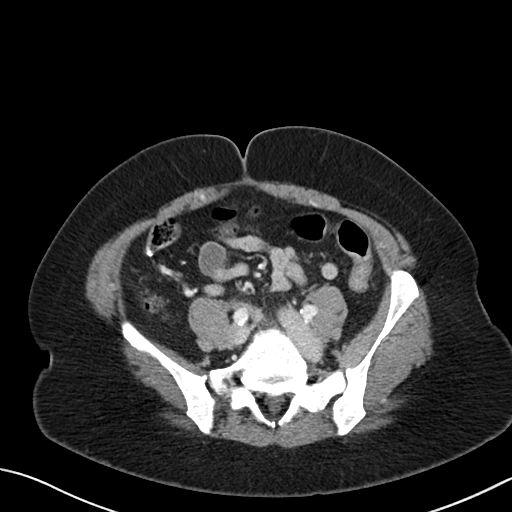
[im 50/95  soft-tissue]
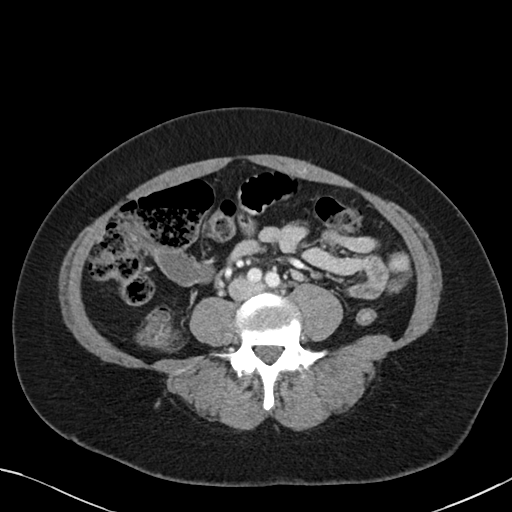
[im 55/95  soft-tissue]
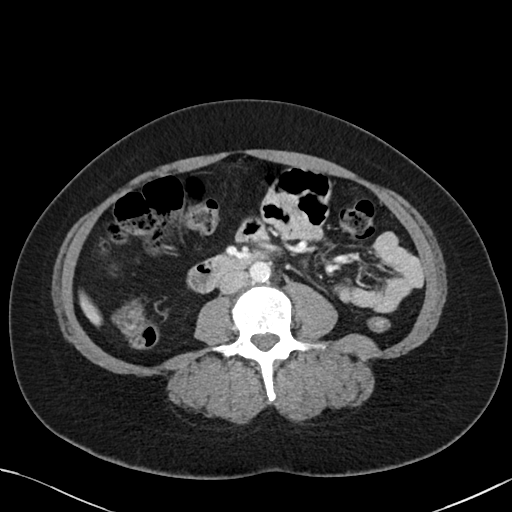
[im 60/95  soft-tissue]
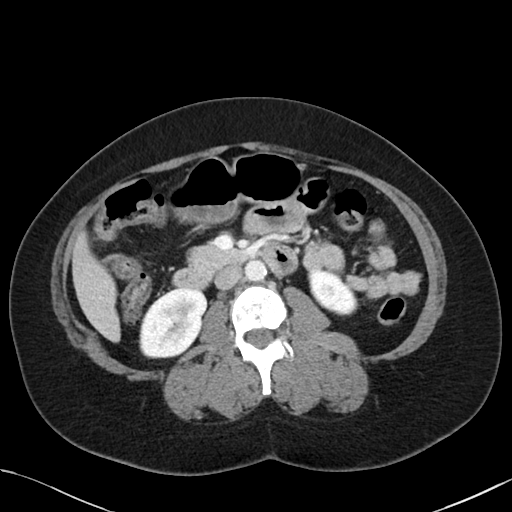
[im 60/95  bone]
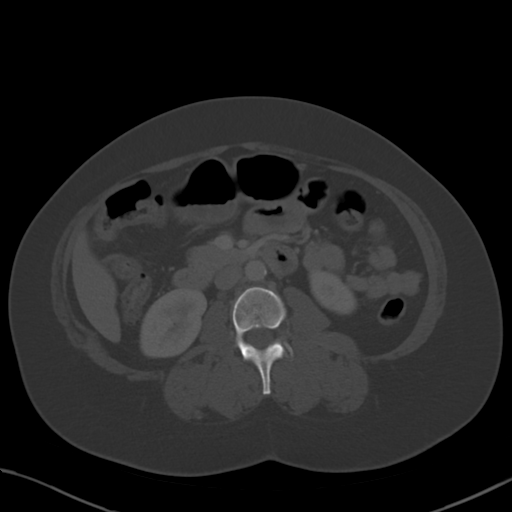
[im 70/95  soft-tissue]
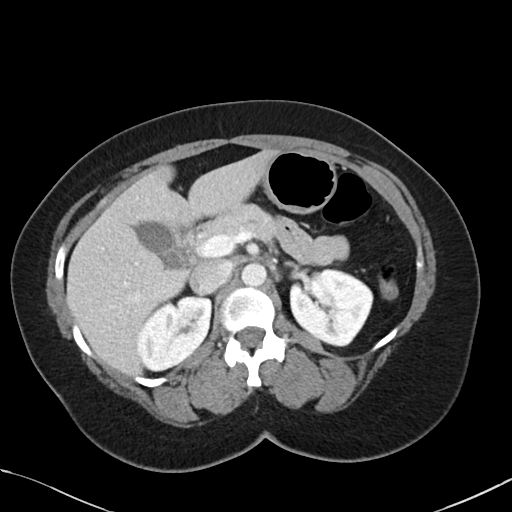
[im 75/95  soft-tissue]
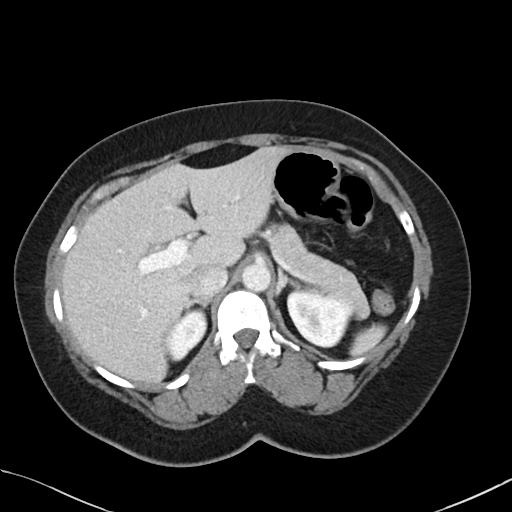
[im 80/95  soft-tissue]
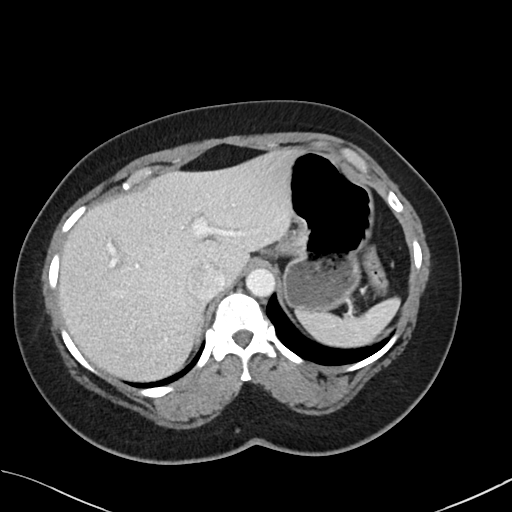
[im 90/95  soft-tissue]
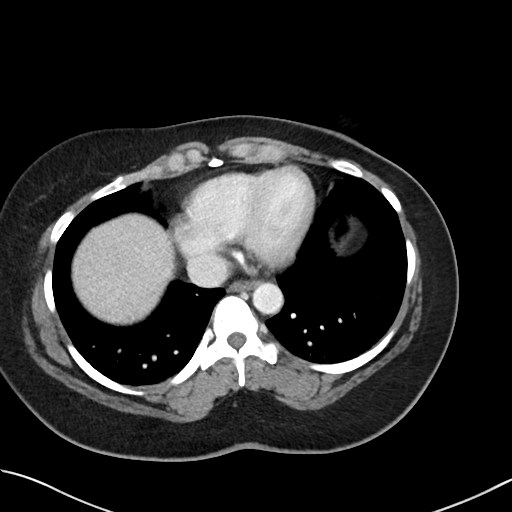

[Series 5: coronal · coronal · 0.78mm/px · 3 of 99 slices shown]
[im 33/99  soft-tissue]
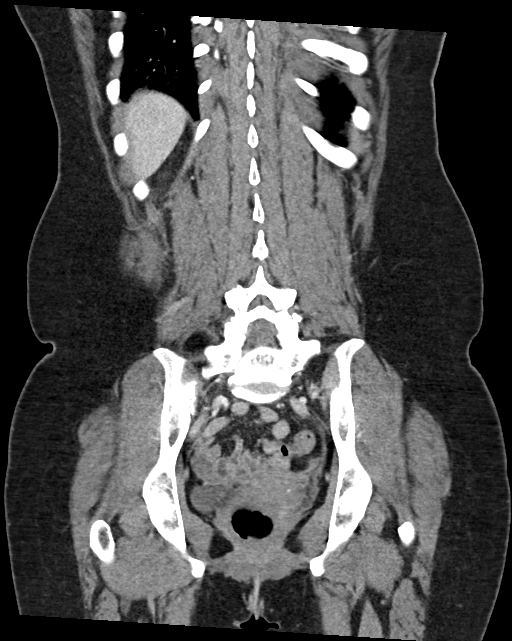
[im 44/99  soft-tissue]
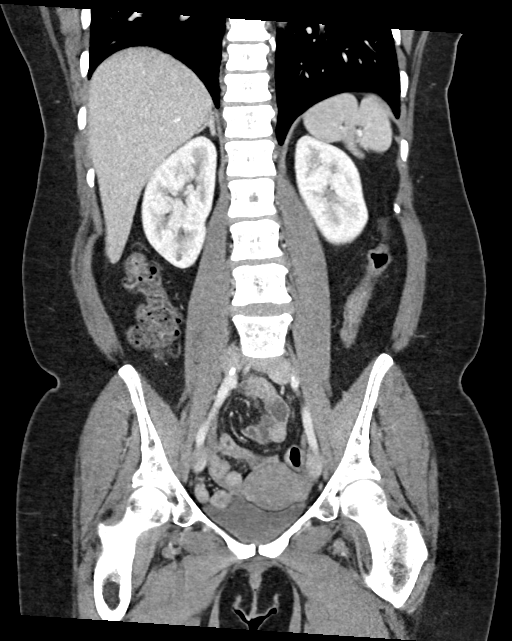
[im 55/99  soft-tissue]
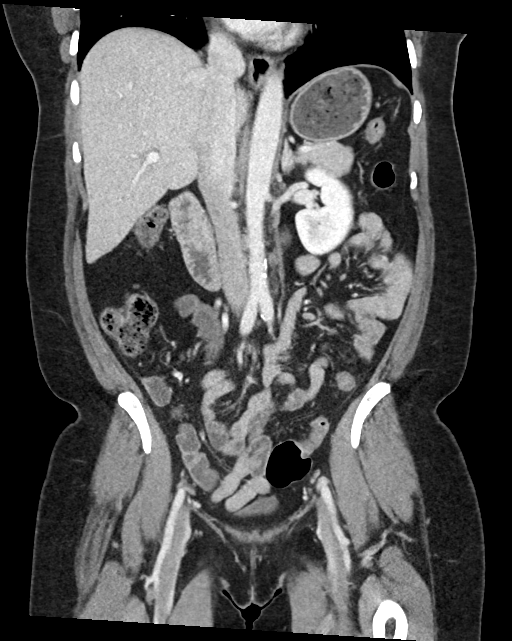

[16 of 46 positions shown; findings below may reference images not displayed]

RADIATION DOSE REDUCTION: This exam was performed according to the
departmental dose-optimization program which includes automated
exposure control, adjustment of the mA and/or kV according to
patient size and/or use of iterative reconstruction technique.

CONTRAST:  100mL OMNIPAQUE IOHEXOL 300 MG/ML  SOLN
FINDINGS: Lower chest: No acute abnormality.

Hepatobiliary: No focal liver abnormality is seen. No gallstones,
gallbladder wall thickening, or biliary dilatation.

Pancreas: Unremarkable. No pancreatic ductal dilatation or
surrounding inflammatory changes.

Spleen: Normal in size without focal abnormality.

Adrenals/Urinary Tract: There is a 2 mm calculus at the level of the
left ureterovesicular junction best seen on coronal image 5/36.
There is no hydronephrosis or perinephric fluid. The kidneys
otherwise appear within normal limits. The adrenal glands and
bladder are within normal limits.

Stomach/Bowel: Stomach is within normal limits. No evidence of bowel
wall thickening, distention, or inflammatory changes. Appendix is
likely surgically absent.

Vascular/Lymphatic: Aortic atherosclerosis. No enlarged abdominal or
pelvic lymph nodes.

Reproductive: Uterus and bilateral adnexa are unremarkable.

Other: No abdominal wall hernia or abnormality. No abdominopelvic
ascites.

Musculoskeletal: No acute or significant osseous findings.
IMPRESSION: 1. 2 mm calculus at the left ureterovesicular junction. No
hydronephrosis.

## 2024-02-16 ENCOUNTER — Emergency Department (HOSPITAL_COMMUNITY)
Admission: EM | Admit: 2024-02-16 | Discharge: 2024-02-17 | Disposition: A | Discharge status: Other Acute Inpatient Hospital | Payer: Self-pay | Attending: Emergency Medicine | Admitting: Emergency Medicine

## 2024-02-16 ENCOUNTER — Other Ambulatory Visit: Payer: Self-pay

## 2024-02-16 ENCOUNTER — Encounter (HOSPITAL_COMMUNITY): Payer: Self-pay

## 2024-02-16 DIAGNOSIS — F1420 Cocaine dependence, uncomplicated: Secondary | ICD-10-CM | POA: Insufficient documentation

## 2024-02-16 DIAGNOSIS — R45851 Suicidal ideations: Secondary | ICD-10-CM

## 2024-02-16 DIAGNOSIS — T43212A Poisoning by selective serotonin and norepinephrine reuptake inhibitors, intentional self-harm, initial encounter: Secondary | ICD-10-CM | POA: Insufficient documentation

## 2024-02-16 DIAGNOSIS — T1491XA Suicide attempt, initial encounter: Secondary | ICD-10-CM

## 2024-02-16 DIAGNOSIS — F332 Major depressive disorder, recurrent severe without psychotic features: Secondary | ICD-10-CM | POA: Insufficient documentation

## 2024-02-16 DIAGNOSIS — T50902A Poisoning by unspecified drugs, medicaments and biological substances, intentional self-harm, initial encounter: Secondary | ICD-10-CM

## 2024-02-16 DIAGNOSIS — F1410 Cocaine abuse, uncomplicated: Secondary | ICD-10-CM | POA: Diagnosis present

## 2024-02-16 LAB — COMPREHENSIVE METABOLIC PANEL WITH GFR
ALT: 9 U/L (ref 0–44)
AST: 15 U/L (ref 15–41)
Albumin: 3.7 g/dL (ref 3.5–5.0)
Alkaline Phosphatase: 52 U/L (ref 38–126)
Anion gap: 9 (ref 5–15)
BUN: 6 mg/dL (ref 6–20)
CO2: 24 mmol/L (ref 22–32)
Calcium: 9.2 mg/dL (ref 8.9–10.3)
Chloride: 108 mmol/L (ref 98–111)
Creatinine, Ser: 0.84 mg/dL (ref 0.44–1.00)
GFR, Estimated: >60 mL/min (ref >60)
Glucose, Bld: 148 mg/dL — ABNORMAL HIGH (ref 70–99)
Potassium: 3.8 mmol/L (ref 3.5–5.1)
Sodium: 141 mmol/L (ref 135–145)
Total Bilirubin: 0.8 mg/dL (ref 0.0–1.2)
Total Protein: 7 g/dL (ref 6.5–8.1)

## 2024-02-16 LAB — ACETAMINOPHEN LEVEL
Acetaminophen (Tylenol), Serum: <10 ug/mL — ABNORMAL LOW (ref 10–30)
Acetaminophen (Tylenol), Serum: <10 ug/mL — ABNORMAL LOW (ref 10–30)

## 2024-02-16 LAB — I-STAT CHEM 8, ED
BUN: 7 mg/dL (ref 6–20)
Calcium, Ion: 1.13 mmol/L — ABNORMAL LOW (ref 1.15–1.40)
Chloride: 109 mmol/L (ref 98–111)
Creatinine, Ser: 0.8 mg/dL (ref 0.44–1.00)
Glucose, Bld: 145 mg/dL — ABNORMAL HIGH (ref 70–99)
HCT: 43 % (ref 36.0–46.0)
Hemoglobin: 14.6 g/dL (ref 12.0–15.0)
Potassium: 3.8 mmol/L (ref 3.5–5.1)
Sodium: 143 mmol/L (ref 135–145)
TCO2: 21 mmol/L — ABNORMAL LOW (ref 22–32)

## 2024-02-16 LAB — RAPID URINE DRUG SCREEN, HOSP PERFORMED
Amphetamines: NOT DETECTED
Barbiturates: NOT DETECTED
Benzodiazepines: NOT DETECTED
Cocaine: POSITIVE — AB
Opiates: NOT DETECTED
Tetrahydrocannabinol: NOT DETECTED

## 2024-02-16 LAB — CBC
HCT: 43.4 % (ref 36.0–46.0)
Hemoglobin: 13.9 g/dL (ref 12.0–15.0)
MCH: 30.3 pg (ref 26.0–34.0)
MCHC: 32 g/dL (ref 30.0–36.0)
MCV: 94.6 fL (ref 80.0–100.0)
Platelets: 446 K/uL — ABNORMAL HIGH (ref 150–400)
RBC: 4.59 MIL/uL (ref 3.87–5.11)
RDW: 14.7 % (ref 11.5–15.5)
WBC: 6.5 K/uL (ref 4.0–10.5)
nRBC: 0 % (ref 0.0–0.2)

## 2024-02-16 LAB — ETHANOL: Alcohol, Ethyl (B): <15 mg/dL (ref <15)

## 2024-02-16 LAB — HCG, SERUM, QUALITATIVE: Preg, Serum: NEGATIVE

## 2024-02-16 LAB — SALICYLATE LEVEL: Salicylate Lvl: <7 mg/dL — ABNORMAL LOW (ref 7.0–30.0)

## 2024-02-16 NOTE — ED Triage Notes (Addendum)
 PT arrives via POV with her sister. Pt reports she took four 20mg  Prozac  tablets about 2 hours ago in an attempt to kill herself. Pt denies HI. She is AxOx4. Denies auditory or visual hallucinations. Pt reports since taking the medication, she has been drowsy and has chest tightness.

## 2024-02-16 NOTE — ED Notes (Signed)
 Eleanor Lamer, patient's sister would like to be contacted as her workup is completed. Her number is [phone]

## 2024-02-16 NOTE — ED Notes (Signed)
 Pt changed into scrubs and socks.  Pt personal belongings placed in purple zone locker #5. Includes clothing, shoes, purse and cell phone.

## 2024-02-16 NOTE — ED Provider Triage Note (Signed)
 Emergency Medicine Provider Triage Evaluation Note  Penny Berger , a 48 y.o. female  was evaluated in triage.  Pt complains of ingestion of 4 tablets of Prozac  in an attempt to harm herself.  She states that she is in the state because of chronic cocaine use, desire to seek detox, strongly states that she does not want to go into a psychiatric facility.  Does want detoxification and treatment for chronic cocaine abuse.  Initially was going to [facility], though her sister brought her to the ED secondary to toxic ingestion.  Review of Systems  Positive: As above Negative:   Physical Exam  BP 106/85 (BP Location: Right Arm)   Pulse 93   Temp 98.5 F (36.9 C)   Resp 17   SpO2 98%  Gen:   Awake, no distress   Resp:  Normal effort  MSK:   Moves extremities without difficulty  Other:    Medical Decision Making  Medically screening exam initiated at 5:04 PM.  Appropriate orders placed.  Penny Berger was informed that the remainder of the evaluation will be completed by another provider, this initial triage assessment does not replace that evaluation, and the importance of remaining in the ED until their evaluation is complete.  Orders entered for medical clearance, at this time patient is under voluntary conditions for remaining in the ED.   Myriam Dorn BROCKS, [place] 02/16/24 [phone]

## 2024-02-16 NOTE — ED Notes (Signed)
 Poison Control has followed up about patient and has decided to close the case. Vitals and lab results were given to [facility].

## 2024-02-16 NOTE — BH Assessment (Signed)
 Clinician spoke to Lauren with [facility] to complete pt's TTS assessment. Clinician provided pt's name, MRN, location, age, room number and provider's name. Secure message completed.    [facility] coordinator to update secure chat when assessment time and provider are assigned.   Jackson JONETTA Broach, MS, [facility], [facility] Triage Specialist [phone]

## 2024-02-16 NOTE — ED Provider Notes (Signed)
 [place] EMERGENCY DEPARTMENT AT [facility] Provider Note   CSN: [id number] Arrival date & time: 02/16/24  [id number]     Patient presents with: Suicide Attempt   Penny Berger is a 48 y.o. female who presents to the emergency department with a chief complaint of suicidal ideation.  Patient states that around 2:30 PM today she took 4 of her 20 mg prescribed Prozac  tablets.  She states that when she did this she was attempting to end her life.  She states that she has had constant suicidal ideations for quite some time.  He denies homicidal ideations.  Denies auditory or visual hallucinations.  She states that she is not currently seeing a psychiatrist or therapist.  Has a past medical history significant for anxiety, depression, suicidal behavior with attempted self-injury, cocaine abuse, alcohol abuse, marijuana use, syncope, seizures, etc. patient states that she attempted to kill herself because she is tired of life, and just everything.  Patient currently denies any symptoms other than drowsiness.  Patient is alert and oriented during initial history.  Denies fever, chills, chest pain, shortness of breath, abdominal pain, nausea, vomiting, visual disturbances.    HPI     Prior to Admission medications   Medication Sig Start Date End Date Taking? Authorizing Provider  FLUoxetine  (PROZAC ) 20 MG capsule Take 1 capsule (20 mg total) by mouth daily. 11/24/22   Goli, Veeraindar, MD  hydrOXYzine  (ATARAX ) 50 MG tablet Take 1 tablet (50 mg total) by mouth 3 (three) times daily as needed for anxiety. 11/23/22   Goli, Veeraindar, MD  medroxyPROGESTERone (DEPO-PROVERA) 150 MG/ML injection Inject 150 mg into the muscle every 3 (three) months.    [provider]  nicotine  (NICODERM CQ  - DOSED IN MG/24 HOURS) 14 mg/24hr patch Place 1 patch (14 mg total) onto the skin daily. 11/24/22   Goli, Veeraindar, MD  propranolol  (INDERAL ) 10 MG tablet Take 1 tablet (10 mg total) by mouth daily as  needed (anxiety, irritability, anxiety before bed affecting sleep). 11/23/22   Goli, Veeraindar, MD  traZODone  (DESYREL ) 50 MG tablet Take 1 tablet (50 mg total) by mouth at bedtime. 11/23/22   Goli, Veeraindar, MD    Allergies: Patient has no known allergies.    Review of Systems  Psychiatric/Behavioral:  Positive for self-injury and suicidal ideas.     Updated Vital Signs BP 119/86 (BP Location: Right Arm)   Pulse 77   Temp 98.5 F (36.9 C)   Resp 17   SpO2 96%   Physical Exam Vitals and nursing note reviewed.  Constitutional:      General: She is awake. She is not in acute distress.    Appearance: Normal appearance. She is not ill-appearing, toxic-appearing or diaphoretic.  HENT:     Head: Normocephalic and atraumatic.  Eyes:     General: No scleral icterus. Cardiovascular:     Rate and Rhythm: Normal rate and regular rhythm.  Pulmonary:     Effort: Pulmonary effort is normal. No respiratory distress.     Breath sounds: No wheezing, rhonchi or rales.  Musculoskeletal:        General: Normal range of motion.     Right lower leg: No edema.     Left lower leg: No edema.  Skin:    General: Skin is warm.     Capillary Refill: Capillary refill takes less than 2 seconds.  Neurological:     General: No focal deficit present.     Mental Status: She is alert  and oriented to person, place, and time.  Psychiatric:        Attention and Perception: Attention normal. She does not perceive auditory or visual hallucinations.        Mood and Affect: Mood is depressed. Affect is blunt and flat.        Speech: Speech normal.        Behavior: Behavior is cooperative.        Thought Content: Thought content is not paranoid or delusional. Thought content includes suicidal ideation. Thought content does not include homicidal ideation. Thought content does not include homicidal plan.     (all labs ordered are listed, but only abnormal results are displayed) Labs Reviewed  COMPREHENSIVE  METABOLIC PANEL WITH GFR - Abnormal; Notable for the following components:      Result Value   Glucose, Bld 148 (*)    All other components within normal limits  CBC - Abnormal; Notable for the following components:   Platelets 446 (*)    All other components within normal limits  RAPID URINE DRUG SCREEN, HOSP PERFORMED - Abnormal; Notable for the following components:   Cocaine POSITIVE (*)    All other components within normal limits  SALICYLATE LEVEL - Abnormal; Notable for the following components:   Salicylate Lvl <7.0 (*)    All other components within normal limits  ACETAMINOPHEN  LEVEL - Abnormal; Notable for the following components:   Acetaminophen  (Tylenol ), Serum <10 (*)    All other components within normal limits  ACETAMINOPHEN  LEVEL - Abnormal; Notable for the following components:   Acetaminophen  (Tylenol ), Serum <10 (*)    All other components within normal limits  I-STAT CHEM 8, ED - Abnormal; Notable for the following components:   Glucose, Bld 145 (*)    Calcium, Ion 1.13 (*)    TCO2 21 (*)    All other components within normal limits  ETHANOL  HCG, SERUM, QUALITATIVE  CBG MONITORING, ED    EKG: EKG Interpretation Date/Time:  Tuesday February 16 2024 16:44:50 EDT Ventricular Rate:  82 PR Interval:  112 QRS Duration:  66 QT Interval:  336 QTC Calculation: 392 R Axis:   81  Text Interpretation: Normal sinus rhythm with sinus arrhythmia Nonspecific T wave abnormality Abnormal ECG When compared with ECG of 17-Nov-2022 19:59, PREVIOUS ECG IS PRESENT Confirmed by Bari Flank [phone]) on 02/16/2024 7:27:46 PM  Radiology: No results found.   Procedures   Medications Ordered in the ED - No data to display                                  Medical Decision Making Amount and/or Complexity of Data Reviewed Labs: ordered.   Patient presents to the ED for concern of suicidal ideation, overdose this involves an extensive number of treatment options, and is  a complaint that carries with it a high risk of complications and morbidity.  The differential diagnosis includes suicidal ideation, homicidal ideation, auditory visual hallucinations, paranoia, schizophrenia, bipolar disorder, acute emotional reaction, suicide attempt, etc.   Co morbidities that complicate the patient evaluation  anxiety, depression, suicidal behavior with attempted self-injury, cocaine abuse, alcohol abuse, marijuana use, syncope, seizures  Lab Tests:  I Ordered, and personally interpreted labs.  The pertinent results include: CBC unremarkable, CMP unremarkable, i-STAT Chem-8 unremarkable, serum pregnancy negative, Tylenol  level less than 10, salicylate level less than 7, UDS positive for cocaine, ethanol less than 15  Medicines ordered and prescription drug management: I have reviewed the patients home medicines and have made adjustments as needed   Test Considered:  None   Critical Interventions:  Patient IVC'ed due to active SI with OD attempt   Consultations Obtained:  I requested consultation with the TTS team,  and discussed lab and imaging findings as well as pertinent plan - they recommend: TTS consult PENDING at time of my sign-out   Problem List / ED Course:  48 year old female, suicide attempt at home attempting to overdose on Prozac , currently asymptomatic other than slight drowsiness, vital signs stable, still admitting to active suicidal ideations, denies homicidal ideations or auditory/visual hallucinations Plan to obtain medical clearance workup, labs overall reassuring, pregnancy negative, Tylenol  level less than 10, salicylate level less than 7, ethanol less than 15, rapid UDS pending at this time Patient currently denies chest pain or chest tightness or other medical complaint Spoke with Olam with poison control, recommends repeat Tylenol  level as well as repeat EKG, after this was completed Poison control followed back up and stated that  workup looked reassuring, closed case with poison control Patient reassessed, medically cleared after repeat tylenol  and repeat EKG, patient currently denies medical complaint At time of my sign-out patient currently pending TTS evaluation, patient has been medically cleared and IVC has been placed. I did not order patient home medications at this time due to recent overdose.  Patient signed out to oncoming provider Myer Schlossman PA-C who assumes care over this patient, further work-up and disposition, currently pending TTS evaluation    Reevaluation:  After the interventions noted above, I reevaluated the patient and found that they have :stayed the same   Social Determinants of Health:  No PCP, active SI with overdose attempt   Dispostion:  After consideration of the diagnostic results and the patients response to treatment, I feel that the patient would benefit from TTS evaluation.      Final diagnoses:  Suicide attempt [facility])  Suicidal thoughts  Intentional overdose, initial encounter [facility])    ED Discharge Orders     None          Janetta Terrall FALCON, PA-C 02/17/24 [id number]    Bari Roxie HERO, DO 02/27/24 0710

## 2024-02-16 NOTE — ED Notes (Signed)
 Pt is now complaining of chest pain, sort nurse made aware. Pt informed she would be next for triage

## 2024-02-16 NOTE — ED Notes (Signed)
Security at bedside, wanding patient

## 2024-02-17 ENCOUNTER — Inpatient Hospital Stay (HOSPITAL_COMMUNITY)
Admission: AD | Admit: 2024-02-17 | Discharge: 2024-02-22 | DRG: 885 | Disposition: A | Discharge status: Home / Self Care | Payer: PRIVATE HEALTH INSURANCE | Source: Intra-hospital

## 2024-02-17 ENCOUNTER — Encounter (HOSPITAL_COMMUNITY): Payer: Self-pay | Admitting: Psychiatry

## 2024-02-17 DIAGNOSIS — F332 Major depressive disorder, recurrent severe without psychotic features: Secondary | ICD-10-CM | POA: Diagnosis present

## 2024-02-17 DIAGNOSIS — Z79899 Other long term (current) drug therapy: Secondary | ICD-10-CM | POA: Diagnosis not present

## 2024-02-17 DIAGNOSIS — Z9151 Personal history of suicidal behavior: Secondary | ICD-10-CM

## 2024-02-17 DIAGNOSIS — F411 Generalized anxiety disorder: Secondary | ICD-10-CM | POA: Diagnosis present

## 2024-02-17 DIAGNOSIS — R45851 Suicidal ideations: Secondary | ICD-10-CM | POA: Diagnosis present

## 2024-02-17 DIAGNOSIS — F17210 Nicotine dependence, cigarettes, uncomplicated: Secondary | ICD-10-CM | POA: Diagnosis present

## 2024-02-17 DIAGNOSIS — T50902A Poisoning by unspecified drugs, medicaments and biological substances, intentional self-harm, initial encounter: Secondary | ICD-10-CM | POA: Diagnosis not present

## 2024-02-17 DIAGNOSIS — F1520 Other stimulant dependence, uncomplicated: Secondary | ICD-10-CM | POA: Diagnosis not present

## 2024-02-17 DIAGNOSIS — F322 Major depressive disorder, single episode, severe without psychotic features: Principal | ICD-10-CM | POA: Diagnosis present

## 2024-02-17 DIAGNOSIS — F1420 Cocaine dependence, uncomplicated: Secondary | ICD-10-CM | POA: Diagnosis not present

## 2024-02-17 MED ORDER — LORAZEPAM 2 MG/ML IJ SOLN: 2.0000 mg | Freq: Three times a day (TID) | INTRAMUSCULAR | Status: DC | PRN

## 2024-02-17 MED ORDER — HALOPERIDOL LACTATE 5 MG/ML IJ SOLN: 10.0000 mg | Freq: Three times a day (TID) | INTRAMUSCULAR | Status: DC | PRN

## 2024-02-17 MED ORDER — OLANZAPINE 5 MG PO TBDP: 5.0000 mg | ORAL_TABLET | Freq: Two times a day (BID) | ORAL | Status: DC | PRN

## 2024-02-17 MED ORDER — DIPHENHYDRAMINE HCL 25 MG PO CAPS: 25.0000 mg | ORAL_CAPSULE | Freq: Four times a day (QID) | ORAL | Status: DC | PRN

## 2024-02-17 MED ORDER — HYDROXYZINE HCL 25 MG PO TABS
25.0000 mg | ORAL_TABLET | Freq: Three times a day (TID) | ORAL | Status: DC | PRN
  Administered 2024-02-19 – 2024-02-20 (×2): 25 mg via ORAL
  Filled 2024-02-17 (×3): qty 1

## 2024-02-17 MED ORDER — DIPHENHYDRAMINE HCL 50 MG/ML IJ SOLN: 50.0000 mg | Freq: Three times a day (TID) | INTRAMUSCULAR | Status: DC | PRN

## 2024-02-17 MED ORDER — HALOPERIDOL 5 MG PO TABS: 5.0000 mg | ORAL_TABLET | Freq: Three times a day (TID) | ORAL | Status: DC | PRN

## 2024-02-17 MED ORDER — MIRTAZAPINE 15 MG PO TABS
15.0000 mg | ORAL_TABLET | Freq: Every day | ORAL | Status: DC
  Filled 2024-02-17: qty 1

## 2024-02-17 MED ORDER — TRAZODONE HCL 50 MG PO TABS
50.0000 mg | ORAL_TABLET | Freq: Every evening | ORAL | Status: DC | PRN
  Administered 2024-02-20 – 2024-02-21 (×2): 50 mg via ORAL
  Filled 2024-02-17 (×2): qty 1

## 2024-02-17 MED ORDER — ALUM & MAG HYDROXIDE-SIMETH 200-200-20 MG/5ML PO SUSP: 30.0000 mL | ORAL | Status: DC | PRN

## 2024-02-17 MED ORDER — HALOPERIDOL LACTATE 5 MG/ML IJ SOLN: 5.0000 mg | Freq: Three times a day (TID) | INTRAMUSCULAR | Status: DC | PRN

## 2024-02-17 MED ORDER — DIPHENHYDRAMINE HCL 25 MG PO CAPS: 50.0000 mg | ORAL_CAPSULE | Freq: Three times a day (TID) | ORAL | Status: DC | PRN

## 2024-02-17 MED ORDER — MAGNESIUM HYDROXIDE 400 MG/5ML PO SUSP: 30.0000 mL | Freq: Every day | ORAL | Status: DC | PRN

## 2024-02-17 MED ORDER — INFLUENZA VIRUS VACC SPLIT PF (FLUZONE) 0.5 ML IM SUSY
0.5000 mL | PREFILLED_SYRINGE | INTRAMUSCULAR | Status: DC
  Filled 2024-02-17: qty 0.5

## 2024-02-17 MED ORDER — MIRTAZAPINE 15 MG PO TABS: 15.0000 mg | ORAL_TABLET | Freq: Every day | ORAL | Status: DC

## 2024-02-17 MED ORDER — ACETAMINOPHEN 325 MG PO TABS: 650.0000 mg | ORAL_TABLET | Freq: Four times a day (QID) | ORAL | Status: DC | PRN

## 2024-02-17 MED ORDER — NICOTINE 14 MG/24HR TD PT24
14.0000 mg | MEDICATED_PATCH | Freq: Every day | TRANSDERMAL | Status: DC
  Filled 2024-02-17 (×3): qty 1

## 2024-02-17 NOTE — ED Notes (Signed)
 [employer] has been contacted to arrange transportation to [facility].  RN made aware of transportation arrangements. No ETA provided by [employer].

## 2024-02-17 NOTE — Group Note (Signed)
 Date:  02/17/2024 Time:  5:13 PM  Group Topic/Focus:  Making Healthy Choices:   The focus of this group is to help patients identify negative/unhealthy choices they were using prior to admission and identify positive/healthier coping strategies to replace them upon discharge.    Participation Level:  Active  Participation Quality:  Appropriate  Affect:  Anxious  Cognitive:  Alert  Insight: Appropriate  Engagement in Group:  Limited  Modes of Intervention:  Discussion    Annalee  Dessirae Scarola 02/17/2024, 5:13 PM

## 2024-02-17 NOTE — Group Note (Signed)
 Date:  02/17/2024 Time:  9:12 PM  Group Topic/Focus:  Wrap-Up Group:   The focus of this group is to help patients review their daily goal of treatment and discuss progress on daily workbooks.    Additional Comments:  Pt attended the NA meeting without any disruptions.   Penny Berger 02/17/2024, 9:12 PM

## 2024-02-17 NOTE — Progress Notes (Signed)
 Pt has been accepted to [facility] on 02/17/2024 Bed assignment: 300-02  Pt meets inpatient criteria per: Carilyn Ada NP  Attending Physician will be: Dr. Prentis    Report can be called un:lwpu: Adult unit: [phone]  Pt can arrive after [facility] WILL UPDATE   Care Team Notified: [facility]  Cherylynn Ernst RN, Elveria Batter NP  [place] Vu Liebman LCSW-A   02/17/2024 9:37 AM

## 2024-02-17 NOTE — Plan of Care (Signed)
  Problem: Education: Goal: Emotional status will improve Outcome: Progressing Goal: Verbalization of understanding the information provided will improve Outcome: Progressing   Problem: Coping: Goal: Ability to verbalize frustrations and anger appropriately will improve Outcome: Progressing   Problem: Health Behavior/Discharge Planning: Goal: Compliance with treatment plan for underlying cause of condition will improve Outcome: Progressing

## 2024-02-17 NOTE — BH Assessment (Signed)
 At 0400, clinician messaged [facility] in secure chat if there are any updates to when the pt will be assessed.    Clinician awaiting response.   Jackson JONETTA Broach, MS, [facility], [facility] Triage Specialist [phone]

## 2024-02-17 NOTE — ED Notes (Signed)
 Patient scheduled for TTS consult at 0630 with [facility].

## 2024-02-17 NOTE — BH Assessment (Signed)
 Pt's assessment moved to 0700. Day shift TTS staff were added to secure message.   Jackson JONETTA Broach, MS, [facility], [facility] Triage Specialist [phone]

## 2024-02-17 NOTE — ED Provider Notes (Signed)
 Emergency Medicine Observation Re-evaluation Note  Penny Berger is a 48 y.o. female, seen on rounds today.  Pt initially presented to the ED for complaints of Suicide Attempt Currently, the patient is sleeping, appears comfortable. Was placed on IVC overnight but no other acute events.   Physical Exam  BP 119/86 (BP Location: Right Arm)   Pulse 77   Temp 98.5 F (36.9 C)   Resp 17   SpO2 96%  Physical Exam General: NAD Lungs: No respiratory distress Psych: calm, cooperative at this time   ED Course / MDM  EKG:EKG Interpretation Date/Time:  Tuesday February 16 2024 16:44:50 EDT Ventricular Rate:  82 PR Interval:  112 QRS Duration:  66 QT Interval:  336 QTC Calculation: 392 R Axis:   81  Text Interpretation: Normal sinus rhythm with sinus arrhythmia Nonspecific T wave abnormality Abnormal ECG When compared with ECG of 17-Nov-2022 19:59, PREVIOUS ECG IS PRESENT Confirmed by Bari Flank [phone]) on 02/16/2024 7:27:46 PM  I have reviewed the labs performed to date as well as medications administered while in observation.  Recent changes in the last 24 hours include waiting for TTS consult. Currently IVC.   Plan  Current plan is for TTS recommendations.     Gennaro Duwaine CROME, DO 02/17/24 [phone]

## 2024-02-17 NOTE — Progress Notes (Signed)
 Patient is a 48 year old female who is admitted to [facility] under IVC after consuming 4 capsules of Prozac  equaling 80 mg in stated suicide attempt. Patient states she has always felt sad and has had thoughts of ending her life before. Patient states that she uses crack on a daily basis and that her family wants her to get help. Patient states firmly that she does not want to stop using crack at this time as it is the only think that makes me happy. Patient is A&Ox4, denies SI, HI and AVH currently rates depression 8/10 and anxiety 0/10. Forms were signed and belongings were searched per unit policy. Belongings not approved were placed in patient locker. Skin was assessed with Nat MHT and Danika RN and found to be WNL. Falls prevention was reviewed with patient and patient was assessed as low fall risk. Unit norms and rules were reviewed and patient verbalized understanding. Patient was oriented to the unit and safety checks were initiated at 15 minute intervals.

## 2024-02-17 NOTE — Consult Note (Addendum)
 [facility] Consult Note  Patient Name: Penny Berger MRN: [id number] DOB: 1976/05/09 DATE OF Consult: 02/17/2024  PRIMARY PSYCHIATRIC DIAGNOSES  1.  MDD, recurrent severe 2.  Cocaine use disorder, severe 3.  Suicide gesture  RECOMMENDATIONS  Inpt psych admission recommended:    [x] YES       []  NO   If yes:       [x]   Pt meets involuntary commitment criteria if not voluntary       []    Pt does not meet involuntary commitment criteria and must be         voluntary. If patient is not voluntary, then discharge is recommended.   Medication recommendations:  initiation of mirtazapine  15mg  po bedtime for mood  PRN olanzapine /zydis   5 mg PO/IM twice daily prn for severe agitation/aggressive behavior    diphenhydramine  25 mg PO/IM every 6 hours as needed for severe agitation/EPS/Anxiety  Please ensure K> 4, Mg> 2 and Qtc < 500 when using antipsychotics. Monitor for extrapyramidal syndrome (EPS) such as dystonia, akathisia, and tardive dyskinesia  Non-Medication recommendations:  CBT     Communication: Treatment team members (and family members if applicable) who were involved in treatment/care discussions and planning, and with whom we spoke or engaged with via secure text/chat, include the following: epic chat   PA [place], Juanita Nurse,   I have discussed my assessment and treatment recommendations with the patient. Possible medication side effects/risks/benefits of current regimen.   Importance of medication adherence for medication to be beneficial.   Follow-Up Telepsychiatry C/L services:            []  We will continue to follow this patient with you.             [x]  Will sign off for now. Please re-consult our service as necessary.  Thank you for involving us  in the care of this patient. If you have any additional questions or concerns, please call [phone] and ask for me or the provider on-call.  TELEPSYCHIATRY ATTESTATION & CONSENT  As the provider for this  telehealth consult, I attest that I verified the patient's identity using two separate identifiers, introduced myself to the patient, provided my credentials, disclosed my location, and performed this encounter via a HIPAA-compliant, real-time, face-to-face, two-way, interactive audio and video platform and with the full consent and agreement of the patient (or guardian as applicable.)  Patient physical location: [facility]. Telehealth provider physical location: home office in state of [place]  Video start time: 06:10am  (Central Time) Video end time: 06:28 am  (Central Time)  IDENTIFYING DATA  Penny Berger is a 48 y.o. year-old female for whom a psychiatric consultation has been ordered by the primary provider. The patient was identified using two separate identifiers.  CHIEF COMPLAINT/REASON FOR CONSULT  I took 4 prozac  20mg , I took them purposefully   HISTORY OF PRESENT ILLNESS (HPI)  The patient presents to ED with SI, reportedly took 4 20mg  fluoxetine  with the thought she would kill herself then I got scared so came here  She is current IVC   Hx of treatment for   MDD, cocaine use disorder; she is not currently prescribed psychotropic medication, reports past tx but always stop because I think I am better and the only thing that makes me happy is crack.  Admits to lying to her family about her drug use and hx of treatment they thought I went but I didn't.  Pt states  just don't  want to live anymore, I am just taking up space, nothing makes me happy, don't want to live like this  reports ongoing SI with thoughts of overdose  Today, client reports symptoms of depression with anergia, anhedonia, amotivation, increased tearfulness,   anxiety, frequent worry, reported  hx of panic symptoms, no reported obsessive/compulsive behaviors.  Denied AVH There is no evidence of psychosis or delusional thinking.  Client denied past episodes of hypomania, hyperactivity, erratic/excessive  spending, involvement in dangerous activities, self-inflated ego, grandiosity, or promiscuity.  sleeping 3-4 hrs/24hrs, appetite decreased concentration decreased. Reviewed active medication list/reviewed labs. Obtained Collateral information from medical record.   EKG QtC  413  PAST PSYCHIATRIC HISTORY     Previous Psychiatric Hospitalizations:  3-4 times  Previous Detox/Residential treatments:  Outpt treatment:  denied Previous psychotropic medication trials: venlafaxine  aripiprazole  fluoxetine  hydroxyzine  propranolol  trazodone  lithium  bupropion alprazolam topiramate sertraline  Previous mental health diagnosis per client/MEDICAL RECORD NUMBERMDD cocaine use; cannabis use panic disorder GAD  Suicide attempts/self-injurious behaviors:  per record review 2003- cut wrists   History of trauma/abuse/neglect/exploitation:  denied  PAST MEDICAL HISTORY  Past Medical History:  Diagnosis Date   Seizures (HCC)    Syncope     HOME MEDICATIONS  PTA Medications  Medication Sig   medroxyPROGESTERone (DEPO-PROVERA) 150 MG/ML injection Inject 150 mg into the muscle every 3 (three) months.   propranolol  (INDERAL ) 10 MG tablet Take 1 tablet (10 mg total) by mouth daily as needed (anxiety, irritability, anxiety before bed affecting sleep).   FLUoxetine  (PROZAC ) 20 MG capsule Take 1 capsule (20 mg total) by mouth daily.   hydrOXYzine  (ATARAX ) 50 MG tablet Take 1 tablet (50 mg total) by mouth 3 (three) times daily as needed for anxiety.   nicotine  (NICODERM CQ  - DOSED IN MG/24 HOURS) 14 mg/24hr patch Place 1 patch (14 mg total) onto the skin daily.   traZODone  (DESYREL ) 50 MG tablet Take 1 tablet (50 mg total) by mouth at bedtime.      ALLERGIES  No Known Allergies  SOCIAL & SUBSTANCE USE HISTORY    Living Situation: reports stays between mom, boyfriend, hotels  one son age 72; brother/sister in law has custody                    employed/ CNA  Education:  Denied current legal issues.     Have you  used/abused any of the following (include frequency/amt/last use):  Denied tobacco/alcohol, admits to  Cocaine   last use yesterday; uses daily, that is the only joy in my life Any history of substance related:  Blackouts:    - Tremors:    - D/T's: -  seizures: -    UDS  positive for: cocaine  BAL<15 Pregnancy test:    negative      FAMILY HISTORY   Family Psychiatric History (if known):  sister hx of hospitalization but pt doesn't know dx-review of record indicates previously informed was depression, father hx of SUD;  no suicides   MENTAL STATUS EXAM (MSE)  Mental Status Exam: General Appearance: Fairly Groomed  Orientation:  Full (Time, Place, and Person)  Memory:  Immediate;   Good Recent;   Good Remote;   Good  Concentration:  Concentration: Good  Recall:  Good  Attention  Good  Eye Contact:  Good  Speech:  Clear and Coherent  Language:  Good  Volume:  Normal  Mood: depressed  Affect:  Appropriate  Thought Process:  Goal Directed  Thought Content:  Logical  Suicidal Thoughts:  Yes.  with intent/plan  Homicidal Thoughts:  No  Judgement:  Fair  Insight:  Fair  Psychomotor Activity:  Normal  Akathisia:  Negative  Fund of Knowledge:  Good    Assets:  Communication Skills Desire for Improvement Housing Social Support Vocational/Educational  Cognition:  WNL  ADL's:  Intact  AIMS (if indicated):       VITALS  Blood pressure 119/86, pulse 77, temperature 98.5 F (36.9 C), resp. rate 17, SpO2 96%.  LABS  Admission on 02/16/2024  Component Date Value Ref Range Status   Sodium 02/16/2024 141  135 - 145 mmol/L Final   Potassium 02/16/2024 3.8  3.5 - 5.1 mmol/L Final   Chloride 02/16/2024 108  98 - 111 mmol/L Final   CO2 02/16/2024 24  22 - 32 mmol/L Final   Glucose, Bld 02/16/2024 148 (H)  70 - 99 mg/dL Final   Glucose reference range applies only to samples taken after fasting for at least 8 hours.   BUN 02/16/2024 6  6 - 20 mg/dL Final   Creatinine, Ser  02/16/2024 0.84  0.44 - 1.00 mg/dL Final   Calcium 90/97/7974 9.2  8.9 - 10.3 mg/dL Final   Total Protein 90/97/7974 7.0  6.5 - 8.1 g/dL Final   Albumin 90/97/7974 3.7  3.5 - 5.0 g/dL Final   AST 90/97/7974 15  15 - 41 U/L Final   ALT 02/16/2024 9  0 - 44 U/L Final   Alkaline Phosphatase 02/16/2024 52  38 - 126 U/L Final   Total Bilirubin 02/16/2024 0.8  0.0 - 1.2 mg/dL Final   GFR, Estimated 02/16/2024 >60  >60 mL/min Final   Comment: (NOTE) Calculated using the CKD-EPI Creatinine Equation (2021)    Anion gap 02/16/2024 9  5 - 15 Final   Performed at [facility] Lab, [address], [place], [place] [address]   Alcohol, Ethyl (B) 02/16/2024 <15  <15 mg/dL Final   Comment: (NOTE) For medical purposes only. Performed at [facility], [address] [place], [place] [address]    WBC 02/16/2024 6.5  4.0 - 10.5 K/uL Final   RBC 02/16/2024 4.59  3.87 - 5.11 MIL/uL Final   Hemoglobin 02/16/2024 13.9  12.0 - 15.0 g/dL Final   HCT 90/97/7974 43.4  36.0 - 46.0 % Final   MCV 02/16/2024 94.6  80.0 - 100.0 fL Final   MCH 02/16/2024 30.3  26.0 - 34.0 pg Final   MCHC 02/16/2024 32.0  30.0 - 36.0 g/dL Final   RDW 90/97/7974 14.7  11.5 - 15.5 % Final   Platelets 02/16/2024 446 (H)  150 - 400 K/uL Final   nRBC 02/16/2024 0.0  0.0 - 0.2 % Final   Performed at [facility] Lab, [address], [place], [place] [address]   Opiates 02/16/2024 NONE DETECTED  NONE DETECTED Final   Cocaine 02/16/2024 POSITIVE (A)  NONE DETECTED Final   Benzodiazepines 02/16/2024 NONE DETECTED  NONE DETECTED Final   Amphetamines 02/16/2024 NONE DETECTED  NONE DETECTED Final   Tetrahydrocannabinol 02/16/2024 NONE DETECTED  NONE DETECTED Final   Barbiturates 02/16/2024 NONE DETECTED  NONE DETECTED Final   Comment: (NOTE) DRUG SCREEN FOR MEDICAL PURPOSES ONLY.  IF CONFIRMATION IS NEEDED FOR ANY PURPOSE, NOTIFY LAB WITHIN 5 DAYS.  LOWEST DETECTABLE LIMITS FOR URINE DRUG SCREEN Drug Class                      Cutoff (ng/mL)  Amphetamine and metabolites    1000 Barbiturate and metabolites    200 Benzodiazepine                 200 Opiates and metabolites        300 Cocaine and metabolites        300 THC                            50 Performed at [facility] Lab, [address], [place], [place] [address]    Preg, Serum 02/16/2024 NEGATIVE  NEGATIVE Final   Comment:        THE SENSITIVITY OF THIS METHODOLOGY IS >10 mIU/mL. Performed at [facility], [address], [place], [place] [address]    Salicylate Lvl 02/16/2024 <7.0 (L)  7.0 - 30.0 mg/dL Final   Performed at [facility], [address] [place], [place] [address]   Acetaminophen  (Tylenol ), Serum 02/16/2024 <10 (L)  10 - 30 ug/mL Final   Comment: (NOTE) Therapeutic concentrations vary significantly. A range of 10-30 ug/mL  may be an effective concentration for many patients. However, some  are best treated at concentrations outside of this range. Acetaminophen  concentrations >150 ug/mL at 4 hours after ingestion  and >50 ug/mL at 12 hours after ingestion are often associated with  toxic reactions.  Performed at [facility], [address] [place], [place] [address]    Sodium 02/16/2024 143  135 - 145 mmol/L Final   Potassium 02/16/2024 3.8  3.5 - 5.1 mmol/L Final   Chloride 02/16/2024 109  98 - 111 mmol/L Final   BUN 02/16/2024 7  6 - 20 mg/dL Final   Creatinine, Ser 02/16/2024 0.80  0.44 - 1.00 mg/dL Final   Glucose, Bld 90/97/7974 145 (H)  70 - 99 mg/dL Final   Glucose reference range applies only to samples taken after fasting for at least 8 hours.   Calcium, Ion 02/16/2024 1.13 (L)  1.15 - 1.40 mmol/L Final   TCO2 02/16/2024 21 (L)  22 - 32 mmol/L Final   Hemoglobin 02/16/2024 14.6  12.0 - 15.0 g/dL Final   HCT 90/97/7974 43.0  36.0 - 46.0 % Final   Acetaminophen  (Tylenol ), Serum 02/16/2024 <10 (L)  10 - 30 ug/mL Final   Comment: (NOTE) Therapeutic concentrations vary significantly. A range of  10-30 ug/mL  may be an effective concentration for many patients. However, some  are best treated at concentrations outside of this range. Acetaminophen  concentrations >150 ug/mL at 4 hours after ingestion  and >50 ug/mL at 12 hours after ingestion are often associated with  toxic reactions.  Performed at [facility], [address], [place], [place] [address]     PSYCHIATRIC REVIEW OF SYSTEMS (ROS)  Depression:      []  Denies all symptoms of depression [x] Depressed mood       [x] Insomnia/hypersomnia              [x] Fatigue        [x] Change in appetite     [x] Anhedonia                                [x] Difficulty concentrating      [x] Hopelessness             [x] Worthlessness [x] Guilt/shame                []   Psychomotor agitation/retardation   Mania:     [x] Denies all symptoms of mania [] Elevated mood           [] Irritability         [] Pressured speech         []  Grandiosity         []  Decreased need for sleep                                                 [] Increased energy          []  Increase in goal directed activity                                       [] Flight of ideas    []  Excessive involvement in high-risk behaviors                   []  Distractibility     Psychosis:     [x] Denies all symptoms of psychosis [] Paranoia         []  Auditory Hallucinations          [] Visual hallucinations         [] ELOC        [] IOR                [] Delusions   Suicide:    []  Denies SI/plan/intent []  Passive SI         [x]   Active SI         [x] Plan           [] Intent   Homicide:  [x]   Denies HI/plan/intent []  Passive HI         []  Active HI         [] Plan            [] Intent           [] Identified Target    Additional findings:      Musculoskeletal: No abnormal movements observed      Gait & Station: Laying/Sitting      Pain Screening: Present - mild to moderate      Nutrition & Dental Concerns: Decrease in food intake and/or loss of appetite  RISK FORMULATION/ASSESSMENT   [place]-Suicide Severity Rating Scale (C-SSRS)  1) Have you wished you were dead or wished you could go to sleep and not wake up? yes 2) Have you actually had any thoughts about killing yourself? yes 3) Have you been thinking about how you might do this? yes  4) Have you had these thoughts and had some intention of acting on them? yes  5) Have you started to work out or worked out the details of how to kill yourself? Did you  intend to carry out this plan? yes 6) Have you done anything, started to do anything, or prepared to do anything to end your life? yes   Is the patient experiencing any suicidal or homicidal ideations:     [x]  Yes reports took 4 20mg  fluoxetine ; continues to have thoughts of overdosing        Protective factors considered for safety management:   Absence of psychosis Access to adequate health care Advice& help seeking Resourcefulness/Survival skills Children Sense of responsibility Spirituality  Risk factors/concerns considered for safety  management:  [x] Prior attempt                                      [x] Hopelessness [] Family history of suicide                    [x] Impulsivity [x] Depression                                         [] Aggression [x] Substance abuse/dependence          [] Isolation [] Physical illness/chronic pain              [] Barriers to accessing treatment [] Recent loss                                        [] Unwillingness to seek help [x] Access to lethal means                      [] Female gender [] Age over 47                                        [x] Unmarried   Is there a safety management plan with the patient and treatment team to minimize risk factors and promote protective factors:     [x] YES          []  NO            Explain: safety obs in ED, admit to inpt psychiatry    Is crisis care placement or psychiatric hospitalization recommended:  [x] YES    [] NO  Based on my current evaluation and risk assessment, patient is determined at  this time to be ju:Yphy risk  Global Suicide Risk Assessment: The Patient is found to be at Low/Moderate risk of suicide or violence; however, risk lethality increased under context of drugs/alcohol. Encouraged to abstain  *RISK ASSESSMENT Risk assessment is a dynamic process; it is possible that this patient's condition, and risk level, may change. This should be re-evaluated and managed over time as appropriate. Please re-consult psychiatric consult services if additional assistance is needed in terms of risk assessment and management. If your team decides to discharge this patient, please advise the patient how to best access emergency psychiatric services, or to call 911, if their condition worsens or they feel unsafe in any way.    Total time spent in this encounter was 35 minutes with greater than 50% of time spent in counseling and coordination of care.     Dr. Mima JUDITHANN Ada, PhD, MSN, APRN, PMHNP-BC, [facility]  KANDICE Ada, NP Telepsychiatry Consult Services

## 2024-02-17 NOTE — Tx Team (Signed)
 Initial Treatment Plan 02/17/2024 4:34 PM Penny Berger FMW:[id number]    PATIENT STRESSORS: Substance abuse     PATIENT STRENGTHS: Capable of independent living  Supportive family/friends    PATIENT IDENTIFIED PROBLEMS: I feel sad and have felt this way ever since I can remember.                     DISCHARGE CRITERIA:  Improved stabilization in mood, thinking, and/or behavior Motivation to continue treatment in a less acute level of care  PRELIMINARY DISCHARGE PLAN: Attend PHP/IOP Outpatient therapy  PATIENT/FAMILY INVOLVEMENT: This treatment plan has been presented to and reviewed with the patient, Penny Berger.  The patient and family have been given the opportunity to ask questions and make suggestions.  Penny Larch, RN 02/17/2024, 4:34 PM

## 2024-02-18 MED ORDER — FLUOXETINE HCL 20 MG PO CAPS
20.0000 mg | ORAL_CAPSULE | Freq: Every day | ORAL | Status: DC
  Administered 2024-02-19 – 2024-02-22 (×4): 20 mg via ORAL
  Filled 2024-02-18: qty 1
  Filled 2024-02-18: qty 7
  Filled 2024-02-18 (×3): qty 1

## 2024-02-18 NOTE — [facility] Suicide Risk Assessment (Signed)
 [facility] Admission Suicide Risk Assessment   Total Time spent with patient: 45 minutes Principal Problem: MDD (major depressive disorder), severe (HCC) Diagnosis:  Principal Problem:   MDD (major depressive disorder), severe (HCC)  For additional subjective and objective data, including HPI, psychiatric history, MSE, assessment and plan, please refer to H&P   Suicide risk: The patient presents with acute risk factors for suicide including active depressive symptoms, suicide attempt (OD on 4 pills of 20 mg prozac  prior to transfer here), currently presenting with dysphoric affect, hopelessness, not future oriented and declining all help including medication management, therapy or any other interventions. She carries additional chronic risk factors of prior hospitalizations, prior endorsed suicidal thoughts, poor medication adherence outside the hospital, substance use concerns (cocaine). Although these are mitigated by some protective factors including employment and family support, she is currently presenting as active high risk of suicide. IVC was continued after interview today. We will continue to monitor on inpatient psychiatry, offer medications and work towards developing good rapport and safe follow up planning.      I certify that inpatient services furnished can reasonably be expected to improve the patient's condition.   Leita LOISE Arts, MD 02/18/2024, 11:40 AM

## 2024-02-18 NOTE — [facility] Counselor (Signed)
 Adult Comprehensive Assessment  Patient ID: Penny Berger, female   DOB: January 29, 1976, 48 y.o.   MRN: [id number]  Information Source: Information source: Patient  Current Stressors:  Patient states their primary concerns and needs for treatment are:: I took some pills pt denies current SI, HI, and AVH Patient states their goals for this hospitilization and ongoing recovery are:: Nothing, I'm not taking medicine, I'm not doing anything Educational / Learning stressors: None reported Employment / Job issues: None reported Family Relationships: None reported Surveyor, quantity / Lack of resources (include bankruptcy): None reported Housing / Lack of housing: None reported Physical health (include injuries & life threatening diseases): None reported Social relationships: I don't have any friends Substance abuse: I did drugs, crack cocaine Bereavement / Loss: None reported  Living/Environment/Situation:  Living Arrangements: Parent Living conditions (as described by patient or guardian): Fine Who else lives in the home?: Just patient and parents How long has patient lived in current situation?: off and on since high school What is atmosphere in current home: Comfortable  Family History:  Marital status: Single Are you sexually active?: No What is your sexual orientation?: bisexual Has your sexual activity been affected by drugs, alcohol, medication, or emotional stress?: None reported Does patient have children?: No  Childhood History:  By whom was/is the patient raised?: Mother, Father Additional childhood history information: Divorced but would spend summers with dad Description of patient's relationship with caregiver when they were a child: It was fine Patient's description of current relationship with people who raised him/her: It's fine How were you disciplined when you got in trouble as a child/adolescent?: I don't really remember Does patient have siblings?:  Yes Number of Siblings: 2 Description of patient's current relationship with siblings: 2 blood siblings -- good relationship with sister, doesn't talk to brother Did patient suffer any verbal/emotional/physical/sexual abuse as a child?: No Did patient suffer from severe childhood neglect?: No Has patient ever been sexually abused/assaulted/raped as an adolescent or adult?: No Was the patient ever a victim of a crime or a disaster?: No Witnessed domestic violence?: No Has patient been affected by domestic violence as an adult?: No  Education:  Highest grade of school patient has completed: High school Currently a student?: No Learning disability?: No  Employment/Work Situation:   Patient's Job has Been Impacted by Current Illness: No What is the Longest Time Patient has Held a Job?: 3 years Where was the Patient Employed at that Time?: nursing home Has Patient ever Been in the [employer]?: Yes (Describe in comment) Did You Receive Any Psychiatric Treatment/Services While in the [employer]?: No  Financial Resources:   Financial resources: Income from employment Does patient have a representative payee or guardian?: No  Alcohol/Substance Abuse:   What has been your use of drugs/alcohol within the last 12 months?: I was doing the crack cocaine If attempted suicide, did drugs/alcohol play a role in this?: No Alcohol/Substance Abuse Treatment Hx: Past Tx, Inpatient If yes, describe treatment: Went to treatment in [place] , short term inpatient Has alcohol/substance abuse ever caused legal problems?: No  Social Support System:   Conservation officer, nature Support System: Fair Museum/gallery exhibitions officer System: Just my family Type of faith/religion: Not right now How does patient's faith help to cope with current illness?: None reported  Leisure/Recreation:   Do You Have Hobbies?: No  Strengths/Needs:   What is the patient's perception of their strengths?: None reported Patient states  they can use these personal strengths during their treatment to contribute to their  recovery: N/A Patient states these barriers may affect/interfere with their treatment: None reported Patient states these barriers may affect their return to the community: None reported  Discharge Plan:   Currently receiving community mental health services: No Patient states concerns and preferences for aftercare planning are: I am not gonna do anything y'all recommend Patient states they will know when they are safe and ready for discharge when: I don't know I guess I'll be stuck here for the rest of my life Does patient have access to transportation?: Yes Does patient have financial barriers related to discharge medications?: No Will patient be returning to same living situation after discharge?: Yes  Summary/Recommendations:   Summary and Recommendations (to be completed by the evaluator): Penny Berger is a 48 y.o female who presents involuntarily at [facility] secondary to [facility] due to a suicide attempt after patient ingested four 20mg  prozac  tablets. Patient denies any current SI, HI, and AVH. Patient denied any stressors and endorsed crack cocaine use, UDS positive for cocaine. Patient continuously stated I just want to get out of here. I've felt like this for 45 years and this is the first time I did anything about it, 80mg  of prozac  is a normal amount for people to take. Patient disclosed that this was an old prescription of prozac  that she ceased to take. Patient repeatedly expressed that she would not be following up with a therapist, psychiatrist, substance use tx, or any other type of services because they never work. Patient was combative and unwilling to disclose any information about herself. Patient declined consents.  While here, Penny Berger can benefit from crisis stabilization, medication management, therapeutic milieu, and referrals for services.   Penny Berger. 02/18/2024

## 2024-02-18 NOTE — Progress Notes (Signed)
 D: Patient is alert and oriented. Patient wants to leave. Denies SI, HI, AVH, and verbally contracts for safety. Patient reports she slept fair last night without sleeping medication. Patient reports her appetite as good, energy level as low, and concentration as good. Patient denies physical symptoms/pain.    A: Patient refused nicotine  patch, flu shot, and prozac . Support provided. Patient educated on safety on the unit and medications. Routine safety checks every 15 minutes. Patient stated understanding to tell nurse about any new physical symptoms. Patient understands to tell staff of any needs.     R: No adverse drug reactions noted. Patient remains safe at this time and will continue to monitor.    02/18/24 0900  Psych Admission Type (Psych Patients Only)  Admission Status Involuntary  Psychosocial Assessment  Patient Complaints None;Other (Comment) (wants to go home)  Eye Contact Brief  Facial Expression Flat  Affect Depressed  Speech Logical/coherent  Interaction Minimal;Guarded  Motor Activity Other (Comment) (WNL)  Appearance/Hygiene Unremarkable  Behavior Characteristics Cooperative;Calm  Mood Depressed  Thought Process  Coherency WDL  Content WDL  Delusions None reported or observed  Perception WDL  Hallucination None reported or observed  Judgment Impaired  Confusion None  Danger to Self  Current suicidal ideation? Denies  Agreement Not to Harm Self Yes  Description of Agreement verbal  Danger to Others  Danger to Others None reported or observed

## 2024-02-18 NOTE — [facility] Group Notes (Signed)
 Adult Psychoeducational Group Note  Date:  02/18/2024 Time:  9:24 AM  Group Topic/Focus:  Goals Group:   The focus of this group is to help patients establish daily goals to achieve during treatment and discuss how the patient can incorporate goal setting into their daily lives to aide in recovery.  Orientation:   The focus of this group is to educate the patient on the purpose and policies of crisis stabilization and provide a format to answer questions about their admission.  The group details unit policies and expectations of patients while admitted.  Participation Level:  Did Not Attend  Additional Comments:  Pt was invited but did not atttend orientation/goals group.  Penny Berger 02/18/2024, 9:24 AM

## 2024-02-18 NOTE — H&P (Signed)
 Psychiatric Admission Assessment Adult  Patient Identification: Penny Berger MRN:  [id number] Date of Evaluation:  02/18/2024 Chief Complaint:  MDD (major depressive disorder), severe (HCC) [F32.2] Principal Diagnosis: MDD (major depressive disorder), severe (HCC) Diagnosis:  Principal Problem:   MDD (major depressive disorder), severe (HCC)  History of Present Illness:  The patient is a 48 y.o. 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 2:30 pm on 9/2). On arrival endorsing SI and chest pain, EKG non-concerning and and repeat EKG on 9/3 demonstrated Qtc of 413. Poison control was contacted and patient was monitored with supportive care. Additoinal labs unremarkable: CBC and CMP grossly WNL, BAL negative, salicylate and acetaminophen  levels negative, bhcg negative. UDS positive for cocaine. Patient deemed medically cleared and transferred to [facility] overnight 02/17/24-02/18/24 under IVC. Mirtazapine  ordered for first evening, however the patient declined the medicine last night.   Psychiatric history: Gained via patient (fair reliability) and chart review (reliable) The patient has no known concern with birth, development or meeting milestones. Did not voice frank abuse or trauma history (although not entirely forthcoming) but did report parents fighting a lot when she was a child. No mental health concerns aside from situational anxiety throughout childhood/adolescence and no mental health providers seen until adult life. Although timeline of depressive symptoms is not entirely clear, she has had 4 prior hospitalizations, all for depression and SI. Most recent admission per our records, was in June 2024 when she was admitted for depressed mood  and SI voluntarily and she was started on prozac /hydroxyzine  combination.Since then she reports being in a similar inpatient place in [place]  within the past 6-8 months (although again timeline of events is not clear) and appears to have been continued on at least prozac . Recurrent depressive symptoms have been characterized by anergia, anhedonia, amotivation, increased tearfulness, decreased energy and concentration and SI. OD on prozac  prior to this hospitalization is her first suicide attempt. She has no history of manic symptoms, with no discrete episodes of elevated/irritable mood, behavioral changes, increased energy, days without sleep. No history of disorganization in thoughts/behaviors. Mood symptoms have best been characterized by an MDD diagnosis. In addition to mood symptoms she has carried anxiety diagnoses including GAD diagnosis, however today denied significant anxiety outside of situational anxiety and denied panic attacks. Mood and anxiety symptoms have been confounded to some degree by substance use, specifically usage of crack cocaine. Today she does not qualify or quantify usage.   Previous psychotropic medications include: Venlafaxine , fluoxetine , sertraline, bupropion, alprazolam, hydroxyzine , lithium , topiramate,  aripiprazole , and trazodone .    Per patient nothing effective and/or partially effective but none tried for long enough  Most recent medications (from previous visible admission) include: prozac  20 mg daily, hydroxyzine  50 mg TID PRN, propranolol  10 mg daily PRN, and trazodone  50 mg at bedtime.  Recent events: Today upon asking the patient's mood she states I don't want to be alive. She responds with this statement to numerous other questions on interview today when this author attempts to discuss previous medications and treatments. Patient states she has been on a number of medications but that she stops them immediately after the hospital every time. She is not sure  if any of them really work but does acknowledge that she  tends to feel better prior to discharge. She was discharged on prozac  at both of her recent hospitalizations and did feel stable at that time, however did not continue with this medication after the hospital. It has been some months since she was taking anything. She does not believe that this time will be any different - states she will take medicine here if she has to but will stop taking it when she leaves and get depressed again. She reports remorse that she was unable to successfully end her life. She got scared and called her sister but wishes she had died instead. Currently she has no interest in discussing plans for the future, states nothing makes her happy and nothing works. Does not feel she will ever be happy again and states that this hospitalization is pointless. States it will not be any different from the other times she was in the hospital where she went to groups and took medicines but ultimately stopped after discharge. She does not wish to discuss substance use today. She does not want this dino or anyone from our team to reach out to family.    [place] Scale:  Flowsheet Row Ad[facility] (Current) [facility] from 02/16/2024 in [facility] Admission (Discharged) [facility]  C-SSRS RISK CATEGORY High Risk High Risk High Risk    Alcohol Screening: 1. How often do you have a drink containing alcohol?: Never 2. How many drinks containing alcohol do you have on a typical day when you are drinking?: 1 or 2 3. How often do you have six or more drinks on one occasion?: Never AUDIT-C Score: 0 4. How often during the last year have you found that you were not able to stop drinking once you had started?: Never 5. How often during the last year have you failed to do what was normally expected  from you because of drinking?: Never 6. How often during the last year have you needed a first drink in the morning to get yourself going after a heavy drinking session?: Never 7. How often during the last year have you had a feeling of guilt of remorse after drinking?: Never 8. How often during the last year have you been unable to remember what happened the night before because you had been drinking?: Never 9. Have you or someone else been injured as a result of your drinking?: No 10. Has a relative or friend or a doctor or another health worker been concerned about your drinking or suggested you cut down?: No Alcohol Use Disorder Identification Test Final Score (AUDIT): 0 Alcohol Brief Interventions/Follow-up: Alcohol education/Brief advice  Past Medical History:  Past Medical History:  Diagnosis Date   Seizures (HCC)    Syncope     Past Surgical History:  Procedure Laterality Date   APPENDECTOMY     CESAREAN SECTION     Family History: History reviewed. No pertinent family history. Family Psychiatric  History: denies  Tobacco Screening:  Social History   Tobacco Use  Smoking Status Every Day   Current packs/day: 0.50   Types: Cigarettes  Smokeless Tobacco Never    BH Tobacco Counseling     Are you interested in Tobacco Cessation Medications?  Yes, implement Nicotene Replacement Protocol Counseled patient on smoking cessation:  Yes Reason Tobacco Screening Not Completed: No value filed.       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 Denies close social supports aside from family. Does not like her job but feels she needs to work to make money and cannot quit until she has another job lined up. Reports crack cocaine usage but did not discuss this  in detail today.   Allergies:  No Known Allergies Lab Results:  Results for orders placed or performed during the hospital encounter of 02/16/24 (from the past 48 hours)  Comprehensive metabolic panel     Status: Abnormal   Collection Time: 02/16/24  4:45 PM  Result Value Ref Range   Sodium 141 135 - 145 mmol/L   Potassium 3.8 3.5 - 5.1 mmol/L   Chloride 108 98 - 111 mmol/L   CO2 24 22 - 32 mmol/L   Glucose, Bld 148 (H) 70 - 99 mg/dL    Comment: Glucose reference range applies only to samples taken after fasting for at least 8 hours.   BUN 6 6 - 20 mg/dL   Creatinine, Ser 9.15 0.44 - 1.00 mg/dL   Calcium 9.2 8.9 - 89.6 mg/dL   Total Protein 7.0 6.5 - 8.1 g/dL   Albumin 3.7 3.5 - 5.0 g/dL   AST 15 15 - 41 U/L   ALT 9 0 - 44 U/L   Alkaline Phosphatase 52 38 - 126 U/L   Total Bilirubin 0.8 0.0 - 1.2 mg/dL   GFR, Estimated >39 >39 mL/min    Comment: (NOTE) Calculated using the CKD-EPI Creatinine Equation (2021)    Anion gap 9 5 - 15    Comment: Performed at [facility], [address] [place], [place] [address]  Ethanol     Status: None   Collection Time: 02/16/24  4:45 PM  Result Value Ref Range   Alcohol, Ethyl (B) <15 <15 mg/dL    Comment: (NOTE) For medical purposes only. Performed at [facility], [address], [place], [place] [address]   cbc     Status: Abnormal   Collection Time: 02/16/24  4:45 PM  Result Value Ref Range   WBC 6.5 4.0 - 10.5 K/uL   RBC 4.59 3.87 - 5.11 MIL/uL   Hemoglobin 13.9 12.0 - 15.0 g/dL   HCT 56.5 63.9 - 53.9 %   MCV 94.6 80.0 - 100.0 fL   MCH 30.3 26.0 - 34.0 pg   MCHC 32.0 30.0 - 36.0 g/dL   RDW 85.2 88.4 - 84.4 %   Platelets 446 (H) 150 - 400 K/uL   nRBC 0.0 0.0 - 0.2 %    Comment: Performed at [facility], [address], [place], [place] [address]  Rapid urine drug screen (hospital performed)     Status: Abnormal   Collection Time: 02/16/24  4:45 PM  Result Value Ref Range   Opiates NONE DETECTED NONE  DETECTED   Cocaine POSITIVE (A) NONE DETECTED   Benzodiazepines NONE DETECTED NONE DETECTED   Amphetamines NONE DETECTED NONE DETECTED   Tetrahydrocannabinol NONE DETECTED NONE DETECTED   Barbiturates NONE DETECTED NONE DETECTED    Comment: (NOTE) DRUG SCREEN FOR MEDICAL PURPOSES ONLY.  IF CONFIRMATION IS NEEDED FOR ANY PURPOSE, NOTIFY LAB WITHIN 5 DAYS.  LOWEST DETECTABLE LIMITS FOR URINE DRUG SCREEN Drug Class  Cutoff (ng/mL) Amphetamine and metabolites    1000 Barbiturate and metabolites    200 Benzodiazepine                 200 Opiates and metabolites        300 Cocaine and metabolites        300 THC                            50 Performed at [facility], [address], [place], [place] [address]   hCG, serum, qualitative     Status: None   Collection Time: 02/16/24  4:45 PM  Result Value Ref Range   Preg, Serum NEGATIVE NEGATIVE    Comment:        THE SENSITIVITY OF THIS METHODOLOGY IS >10 mIU/mL. Performed at [facility], [address], [place], [place] [address]   Salicylate level     Status: Abnormal   Collection Time: 02/16/24  4:45 PM  Result Value Ref Range   Salicylate Lvl <7.0 (L) 7.0 - 30.0 mg/dL    Comment: Performed at [facility], [address], [place], [place] [address]  Acetaminophen  level     Status: Abnormal   Collection Time: 02/16/24  4:45 PM  Result Value Ref Range   Acetaminophen  (Tylenol ), Serum <10 (L) 10 - 30 ug/mL    Comment: (NOTE) Therapeutic concentrations vary significantly. A range of 10-30 ug/mL  may be an effective concentration for many patients. However, some  are best treated at concentrations outside of this range. Acetaminophen  concentrations >150 ug/mL at 4 hours after ingestion  and >50 ug/mL at 12 hours after ingestion are often associated with  toxic reactions.  Performed at [facility], [address] [place], [place] [address]   I-stat chem 8, ED (not at [facility], [facility] or  [facility])     Status: Abnormal   Collection Time: 02/16/24  5:09 PM  Result Value Ref Range   Sodium 143 135 - 145 mmol/L   Potassium 3.8 3.5 - 5.1 mmol/L   Chloride 109 98 - 111 mmol/L   BUN 7 6 - 20 mg/dL   Creatinine, Ser 9.19 0.44 - 1.00 mg/dL   Glucose, Bld 854 (H) 70 - 99 mg/dL    Comment: Glucose reference range applies only to samples taken after fasting for at least 8 hours.   Calcium, Ion 1.13 (L) 1.15 - 1.40 mmol/L   TCO2 21 (L) 22 - 32 mmol/L   Hemoglobin 14.6 12.0 - 15.0 g/dL   HCT 56.9 63.9 - 53.9 %  Acetaminophen  level     Status: Abnormal   Collection Time: 02/16/24  8:54 PM  Result Value Ref Range   Acetaminophen  (Tylenol ), Serum <10 (L) 10 - 30 ug/mL    Comment: (NOTE) Therapeutic concentrations vary significantly. A range of 10-30 ug/mL  may be an effective concentration for many patients. However, some  are best treated at concentrations outside of this range. Acetaminophen  concentrations >150 ug/mL at 4 hours after ingestion  and >50 ug/mL at 12 hours after ingestion are often associated with  toxic reactions.  Performed at [facility] Lab, [address] [place], [place] [address]     Blood Alcohol level:  Lab Results  Component Value Date   [facility] <15 02/16/2024   ETH <10 11/17/2022    Metabolic Disorder Labs:  Lab Results  Component Value Date  HGBA1C 6.1 (H) 11/17/2022   MPG 128 11/17/2022   MPG 116.89 05/02/2022   Lab Results  Component Value Date   PROLACTIN 13.7 11/17/2022   PROLACTIN 5.0 05/02/2022   Lab Results  Component Value Date   CHOL 229 (H) 11/17/2022   TRIG 113 11/17/2022   HDL 37 (L) 11/17/2022   CHOLHDL 6.2 11/17/2022   VLDL 23 11/17/2022   LDLCALC 169 (H) 11/17/2022   LDLCALC 154 (H) 05/02/2022    Current Medications: Current Facility-Administered Medications  Medication Dose Route Frequency Provider Last Rate Last Admin   acetaminophen  (TYLENOL ) tablet 650 mg  650 mg Oral Q6H PRN Coleman, Carolyn H, NP       alum  & mag hydroxide-simeth (MAALOX/MYLANTA) 200-200-20 MG/5ML suspension 30 mL  30 mL Oral Q4H PRN Coleman, Carolyn H, NP       haloperidol  (HALDOL ) tablet 5 mg  5 mg Oral TID PRN Mardy Elveria DEL, NP       And   diphenhydrAMINE  (BENADRYL ) capsule 50 mg  50 mg Oral TID PRN Mardy Elveria DEL, NP       haloperidol  lactate (HALDOL ) injection 5 mg  5 mg Intramuscular TID PRN Mardy Elveria DEL, NP       And   diphenhydrAMINE  (BENADRYL ) injection 50 mg  50 mg Intramuscular TID PRN Mardy Elveria DEL, NP       And   LORazepam  (ATIVAN ) injection 2 mg  2 mg Intramuscular TID PRN Mardy Elveria DEL, NP       haloperidol  lactate (HALDOL ) injection 10 mg  10 mg Intramuscular TID PRN Mardy Elveria DEL, NP       And   diphenhydrAMINE  (BENADRYL ) injection 50 mg  50 mg Intramuscular TID PRN Mardy Elveria DEL, NP       And   LORazepam  (ATIVAN ) injection 2 mg  2 mg Intramuscular TID PRN Coleman, Carolyn H, NP       hydrOXYzine  (ATARAX ) tablet 25 mg  25 mg Oral TID PRN Mardy Elveria DEL, NP       influenza vac split trivalent PF (FLUZONE ) injection 0.5 mL  0.5 mL Intramuscular Tomorrow-1000 Trudy Carwin, NP       magnesium  hydroxide (MILK OF MAGNESIA) suspension 30 mL  30 mL Oral Daily PRN Coleman, Carolyn H, NP       mirtazapine  (REMERON ) tablet 15 mg  15 mg Oral QHS Mardy Elveria DEL, NP       nicotine  (NICODERM CQ  - dosed in mg/24 hours) patch 14 mg  14 mg Transdermal Daily Pashayan, Alexander S, DO       traZODone  (DESYREL ) tablet 50 mg  50 mg Oral QHS PRN Coleman, Carolyn H, NP       PTA Medications: Medications Prior to Admission  Medication Sig Dispense Refill Last Dose/Taking   cyclobenzaprine (FLEXERIL) 10 MG tablet Take 10 mg by mouth at bedtime. (Patient not taking: Reported on 02/17/2024)      FLUoxetine  (PROZAC ) 20 MG capsule Take 1 capsule (20 mg total) by mouth daily. (Patient not taking: Reported on 02/17/2024) 30 capsule 0    hydrOXYzine  (ATARAX ) 50 MG tablet Take 1 tablet (50 mg total) by  mouth 3 (three) times daily as needed for anxiety. (Patient not taking: Reported on 02/17/2024) 30 tablet 0    medroxyPROGESTERone (DEPO-PROVERA) 150 MG/ML injection Inject 150 mg into the muscle every 3 (three) months.      propranolol  (INDERAL ) 10 MG tablet Take 1 tablet (10 mg total) by mouth daily as  needed (anxiety, irritability, anxiety before bed affecting sleep). (Patient not taking: Reported on 02/17/2024) 30 tablet 0    traZODone  (DESYREL ) 50 MG tablet Take 1 tablet (50 mg total) by mouth at bedtime. (Patient not taking: Reported on 02/17/2024) 30 tablet 0     Mental Status exam: Appearance: black female of average BMI, tight cap on head, seen huddled under her blankets laying on her side.  Eye contact: limited - largely stares at the ground  Attitude towards examiner withdrawn, mildly irritable  Psychomotor: psychomotor retardation present  Speech: reduced amount, short, clipped sentences, often one word responses  Language: no delays  Mood: I don't want to be alive  Affect: congruent, dysphoric and irritable at times, limited range and reactivity  Thought content: endorsing active suicidal thoughts, no clear plan but discussing intent, no HI, no delusions expressed  Thought Process: linear and organized  Perception: denying AVH, not RTIS  Insight: fair  Judgement: limited   Orientation: x3 Attention/Concentration: fair - volitionally disengaged at tiems  Memory/Cognition: not formally assessed; recent and remote memory grossly intact   Fund of Knowledge: Average    Musculoskeletal: Strength & Muscle Tone: within normal limits Gait & Station: normal Patient leans: N/A  Physical Exam Constitutional:      General: She is not in acute distress. HENT:     Head: Normocephalic and atraumatic.  Eyes:     Extraocular Movements: Extraocular movements intact.  Pulmonary:     Effort: Pulmonary effort is normal.  Abdominal:     General: There is no distension.  Musculoskeletal:         General: Normal range of motion.  Neurological:     General: No focal deficit present.     Mental Status: She is alert.    ROS Blood pressure 124/84, pulse 85, temperature (!) 97.4 F (36.3 C), temperature source Oral, resp. rate 16, height 5' 3 (1.6 m), weight 65.3 kg, SpO2 100%. Body mass index is 25.51 kg/m.  Treatment Plan Summary: Daily contact with patient to assess and evaluate symptoms and progress in treatment  Assessment: The patient is a 48 y.o. female with a psychiatric history most consistent with major depressive disorder, recurrent, severe, without psychotic features and stimulant use disorder, moderate. She has had recurrent low mood episodes from at least her 20s characterized by avolition, anhedonia, severely low mood, poor sleep, poor energy, poor concentration and suicidal thoughts with one suicide attempt (prior to this admission). She does not have any history of mania or psychosis. She previously carried a GAD diagnosis but today voiced situational anxiety only, denied significant impact on life and denied panic attacks. She does likely meet criteria for at least a moderate stimulant use disorder given frequent usage of crack cocaine and some impact on life (worsening of depressive symptoms, relationship issues with family). She was not willing to discuss usage today.   Today the patient was observably dysphoric in affect, presented as irritable and hopeless, denying belief that she will ever get better and continuing to voice suicidal ideation - no plan but voicing intent. At this time she appears to be in the midst of a major depressive episode. Mirtazapine  was offered last night by admitting physician, but she declined it. Although patient did overdose on prozac  prior to this admission, there is some evidence per chart review that this medication was the most effective for her. She is at high risk of overdose in the future on any medication that she is given and  mirtazapine   is more lethal in overdose than prozac .  Given this, although she did have an OD on it, prozac  will be restarted for her at 20 mg daily. EKG reviewed with no concerning findings.    DSM-5 diagnoses: Major Depressive Disorder, recurrent, severe, without psychotic features  2.  Stimulant Use Disorder, moderate    Plan:  Legal Status: -Involuntary - second IVC filled out today (02/18/24) at 10:15 am  Safety -q15 minute checks  -elopement, suicide and assault precautions  -daily vitals  Psychiatric Concerns  -Discontinue Mirtazapine  - patient declined and is more lethal in OD than SSRIs -Begin Prozac  20 mg daily    -although she had an OD on this medicine, it is generally non-lethal in overdose and good historical evidence of benefit per chart review and patient   Substance use concerns  -Stimulant use disorder -currently minimizing and non-forthcoming. Will continue to discuss with patient   Nicotine  Replacement  Patch and gum offered   Medical concerns -Recent OD, no significant medical complications, supportive care only and immediate medical clearance -No chronic conditions requiring medicines; will CTM for needs   Additional PRNs: -Tylenol  tablets 650 mg every 6 hours as needed for pain -Maalox/Mylanta suspension 30 mL every 4 hours as needed for indigestion  -Milk of Magnesia 30 mL daily as needed for constipation  Labs -Reviewed as documented in HPI; non concerning   Psychosocial interventions  -Motivational interviewing  -daily medication management with psychiatry -Medication education regarding risks/benefits and alternatives -bedside psychotherapy as indicated  -Patient will be encouraged to participate and engage with group therapy  -Appreciate SW assistance in coordinating safe disposition    I certify that inpatient services furnished can reasonably be expected to improve the patient's condition.    Leita LOISE Arts, MD 9/4/20257:49 AM

## 2024-02-18 NOTE — Progress Notes (Signed)
   02/17/24 2303  Psych Admission Type (Psych Patients Only)  Admission Status Involuntary  Psychosocial Assessment  Patient Complaints None  Eye Contact Brief  Facial Expression Flat  Affect Depressed;Sad  Speech Logical/coherent  Interaction Assertive  Motor Activity Other (Comment) (WDL)  Appearance/Hygiene Unremarkable  Behavior Characteristics Cooperative;Calm  Mood Depressed  Thought Process  Coherency WDL  Content WDL  Delusions None reported or observed  Perception WDL  Hallucination None reported or observed  Judgment Impaired  Confusion None  Danger to Self  Current suicidal ideation? Denies  Agreement Not to Harm Self Yes  Description of Agreement Verbal  Danger to Others  Danger to Others None reported or observed

## 2024-02-18 NOTE — Group Note (Signed)
 Occupational Therapy Group Note  Group Topic: Sleep Hygiene  Group Date: 02/18/2024 Start Time: 1500 End Time: 1530 Facilitators: Dot [place] MATSU, OT   Group Description: Group encouraged increased participation and engagement through topic focused on sleep hygiene. Patients reflected on the quality of sleep they typically receive and identified areas that need improvement. Group was given background information on sleep and sleep hygiene, including common sleep disorders. Group members also received information on how to improve one's sleep and introduced a sleep diary as a tool that can be utilized to track sleep quality over a length of time. Group session ended with patients identifying one or more strategies they could utilize or implement into their sleep routine in order to improve overall sleep quality.        Therapeutic Goal(s):  Identify one or more strategies to improve overall sleep hygiene  Identify one or more areas of sleep that are negatively impacted (sleep too much, too little, etc)     Participation Level:  Independent  Participation Quality:  Engaged  Behavior: Appropriate   Speech/Thought Process: Relevant   Affect/Mood: Appropriate   Insight: Fair   Judgement: Fair      Modes of Intervention: Education  Patient Response to Interventions:  Attentive   Plan: Continue to engage patient in OT groups 2 - 3x/week.  02/18/2024  [place] MATSU Dot, OT Nickoli Bagheri, OT

## 2024-02-18 NOTE — Plan of Care (Signed)

## 2024-02-18 NOTE — Progress Notes (Signed)
(  Sleep Hours) - 7.25 (Any PRNs that were needed, meds refused, or side effects to meds)- No PRN meds given, remeron  15 mg refused at evening med pass.  (Any disturbances and when (visitation, over night)- None  (Concerns raised by the patient)- None  (SI/HI/AVH)- Denies SI/HI/AVH

## 2024-02-18 NOTE — Plan of Care (Signed)
   Problem: Education: Goal: Emotional status will improve Outcome: Progressing Goal: Mental status will improve Outcome: Progressing Goal: Verbalization of understanding the information provided will improve Outcome: Progressing   Problem: Activity: Goal: Interest or engagement in activities will improve Outcome: Progressing

## 2024-02-18 NOTE — [facility] Group Notes (Signed)
 [facility] Group Notes:  (Nursing/MHT/Case Management/Adjunct)  Date:  02/18/2024  Time:  11:43 PM  Type of Therapy:  Wrap-up group  Participation Level:  Did Not Attend  Participation Quality:    Affect:    Cognitive:    Insight:    Engagement in Group:    Modes of Intervention:    Summary of Progress/Problems: Refused to attend group.  Grayce LITTIE Essex 02/18/2024, 11:43 PM

## 2024-02-18 NOTE — Group Note (Addendum)
 LCSW Group Therapy Note   Group Date: 02/18/2024 Start Time: 1100 End Time: 1200   Participation:  patient was present.  She listened and was respectful but didn't participate in the discussion.  Type of Therapy:  Group Therapy  Topic:  Healing Hearts:  A Safe Space for Grief  Objective:  The objective of this class, Healing Hearts: A Safe Space for Grief, is to create a compassionate environment where participants can process their grief, explore different stages of grief, and discover ways to honor their loved ones through personal rituals.  3 Goals: Provide a safe and supportive space where participants feel comfortable sharing their feelings and experiences of grief without judgment. Educate participants about the stages of grief and emphasize that there is no right way to grieve or a fixed timeline for healing. Introduce the concept of rituals as a means to process grief, allowing individuals to honor their loved ones in a personal and meaningful way.  Summary:  In Healing Hearts: A Safe Space for Grief, we explored the unique and personal journey of grief, emphasizing that everyone experiences it differently. We discussed the five stages of grief (denial, anger, bargaining, depression, and acceptance), with the understanding that grief is not linear. Rituals were introduced as a way to help cope with loss, offering comfort and connection through meaningful actions such as lighting candles or taking memory walks. Participants were encouraged to express their emotions, focus on self-care, and reflect on moments of gratitude for their loved ones, recognizing that healing is a process and there is no timeline for grief.  Therapeutic Modalities: Psychoeducation:  5 Stages of Grief Elements of DBT:  mindfulness and grounding for distress tolerance   Chevelle Coulson O Timm Bonenberger, LCSWA 02/18/2024  1:02 PM

## 2024-02-19 ENCOUNTER — Encounter (HOSPITAL_COMMUNITY): Payer: Self-pay

## 2024-02-19 NOTE — Plan of Care (Signed)
   Problem: Education: Goal: Emotional status will improve Outcome: Progressing Goal: Mental status will improve Outcome: Progressing Goal: Verbalization of understanding the information provided will improve Outcome: Progressing

## 2024-02-19 NOTE — Progress Notes (Signed)
 [facility] MD Progress Note  02/19/2024 8:15 AM Penny Berger  MRN:  [id number]  Principal Problem: MDD (major depressive disorder), severe (HCC) Diagnosis: Principal Problem:   MDD (major depressive disorder), severe (HCC)  Total Time spent with patient: 25 minutes   Identifying Information and Past Psychiatric History:  The patient is a 48 y.o. 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 2:30 pm on 9/2). On arrival endorsing SI and chest pain. She was transferred to [facility] overnight 9/3-9/4 after medical clearance.  Psychiatric history is notable for recurrent major depressive episodes with 4 lifetime hospitalizations for symptoms including anergia, anhedonia, amotivation, increased tearfulness, decreased energy and concentration and SI. Prior to this hospitalization she had never had a suicide attempt, and OD on this hospitalization was an attempt vs gesture given low amount ingested (4 tablets of 20 mg prozac ). No history of mania or psychosis. Historical diagnoses of GAD and panic, although patient has since reported experiencing only mild situational anxiety not impairing to life. She does reportedly have a history of PNES, although has not had pseudoseizures in a number of years. Depression has been largely refractory to treatment with numerous medicines tried as documented below. However this is confounded by poor outpatient adherence with reports of stopping all medications after hospitalization. Finally, the patient has had frequent usage of crack cocaine which has likely worsened depression to some degree.   Previous psychotropic medications include: Venlafaxine , fluoxetine , sertraline, bupropion, alprazolam, hydroxyzine , lithium ,  topiramate,  aripiprazole , and trazodone .   Interval events: Patient was evaluated yesterday, offered prozac  20 mg daily in place of mirtazapine  given evidence per chart review of prior benefit and due to SSRIs being less lethal in an overdose than mirtazapine  or other classes of antidepressant medications. Patient refused medicine yesterday and had refused the mirtazapine  the night before. Overall non-cooperative with care, declining going to groups yesterday morning/afternoon and requesting numerous times to be allowed to leave the hospital despite reporting ongoing high depression and wish to not be alive. She did attend a group later in the evening with good participation. Per NS she has presented as depressed in affect and mood, denying SI, HI and AVH. No behavioral concerns or PRNs required. BP 124/80 (BP Location: Right Arm)   Pulse 79   Temp 98.1 F (36.7 C) (Oral)   Resp 16   Ht 5' 3 (1.6 m)   Wt 65.3 kg   SpO2 100%   BMI 25.51 kg/m   Interview: Today the patient reports she is alright. States she took the prozac  this morning and will go to groups because I know that is what will get me out of here. Denies SI today. Does frequently ask about when she may be discharged during interview and does not wish to speak on her thoughts when she took the 4 pills of prozac . She does state that she had a full bottle and could have taken more but choose not to, and chose to call her sister instead. She knew she would not die from this. Overall she is worried about her job, stating it is the only thing I have left and worries that  hospital stay will place it at risk. Discussed with her taking things day by day and continuing  to work with the group therapy and medicines. She does again acknowledge that she usually feels better at the end of a hospital stay but never continues the medications after. Remains skeptical that she will continue on a medication this time. No SI, HI or AVH. No other  concerns today.   Past Medical History:  Past Medical History:  Diagnosis Date   Seizures (HCC)    Syncope     Past Surgical History:  Procedure Laterality Date   APPENDECTOMY     CESAREAN SECTION     Family History: History reviewed. No pertinent family history.  Social History:  Social History   Substance and Sexual Activity  Alcohol Use Not Currently     Social History   Substance and Sexual Activity  Drug Use Yes   Types: Cocaine    Social History   Socioeconomic History   Marital status: Single    Spouse name: Not on file   Number of children: Not on file   Years of education: Not on file   Highest education level: Not on file  Occupational History   Not on file  Tobacco Use   Smoking status: Every Day    Current packs/day: 0.50    Types: Cigarettes   Smokeless tobacco: Never  Vaping Use   Vaping status: Never Used  Substance and Sexual Activity   Alcohol use: Not Currently   Drug use: Yes    Types: Cocaine   Sexual activity: Yes  Other Topics Concern   Not on file  Social History Narrative   Not on file   Social Drivers of Health   Financial Resource Strain: Not on file  Food Insecurity: No Food Insecurity (02/17/2024)   Hunger Vital Sign    Worried About Running Out of Food in the Last Year: Never true    Ran Out of Food in the Last Year: Never true  Transportation Needs: No Transportation Needs (02/17/2024)   PRAPARE - Administrator, Civil Service (Medical): No    Lack of Transportation (Non-Medical): No  Physical Activity: Not on file  Stress: Not on file  Social Connections: Not on file   Additional Social History:  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 Denies close social supports aside from family. Does not like her job but feels she needs to work to make money and cannot quit until she has another job lined up. Reports crack cocaine usage but did not discuss this in detail.   Current Medications: Current  Facility-Administered Medications  Medication Dose Route Frequency Provider Last Rate Last Admin   acetaminophen  (TYLENOL ) tablet 650 mg  650 mg Oral Q6H PRN Coleman, Carolyn H, NP       alum & mag hydroxide-simeth (MAALOX/MYLANTA) 200-200-20 MG/5ML suspension 30 mL  30 mL Oral Q4H PRN Coleman, Carolyn H, NP       haloperidol  (HALDOL ) tablet 5 mg  5 mg Oral TID PRN Mardy Elveria DEL, NP       And   diphenhydrAMINE  (BENADRYL ) capsule 50 mg  50 mg Oral TID PRN Mardy Elveria DEL, NP       haloperidol  lactate (HALDOL ) injection 5 mg  5 mg Intramuscular TID PRN Mardy Elveria DEL, NP       And   diphenhydrAMINE  (BENADRYL ) injection 50 mg  50 mg Intramuscular TID PRN Mardy Elveria DEL, NP       And   LORazepam  (ATIVAN ) injection 2 mg  2 mg Intramuscular TID PRN  Mardy Elveria DEL, NP       haloperidol  lactate (HALDOL ) injection 10 mg  10 mg Intramuscular TID PRN Mardy Elveria DEL, NP       And   diphenhydrAMINE  (BENADRYL ) injection 50 mg  50 mg Intramuscular TID PRN Mardy Elveria DEL, NP       And   LORazepam  (ATIVAN ) injection 2 mg  2 mg Intramuscular TID PRN Mardy Elveria DEL, NP       FLUoxetine  (PROZAC ) capsule 20 mg  20 mg Oral Daily Towana Leita SAILOR, MD   20 mg at 02/19/24 9195   hydrOXYzine  (ATARAX ) tablet 25 mg  25 mg Oral TID PRN Coleman, Carolyn H, NP   25 mg at 02/19/24 0804   influenza vac split trivalent PF (FLUZONE ) injection 0.5 mL  0.5 mL Intramuscular Tomorrow-1000 Trudy Carwin, NP       magnesium  hydroxide (MILK OF MAGNESIA) suspension 30 mL  30 mL Oral Daily PRN Mardy Elveria DEL, NP       nicotine  (NICODERM CQ  - dosed in mg/24 hours) patch 14 mg  14 mg Transdermal Daily Pashayan, Alexander S, DO       traZODone  (DESYREL ) tablet 50 mg  50 mg Oral QHS PRN Mardy Elveria DEL, NP        Lab Results: No results found for this or any previous visit (from the past 48 hours).  Blood Alcohol level:  Lab Results  Component Value Date   [facility] <15 02/16/2024   ETH <10 11/17/2022     Metabolic Disorder Labs: Lab Results  Component Value Date   HGBA1C 6.1 (H) 11/17/2022   MPG 128 11/17/2022   MPG 116.89 05/02/2022   Lab Results  Component Value Date   PROLACTIN 13.7 11/17/2022   PROLACTIN 5.0 05/02/2022   Lab Results  Component Value Date   CHOL 229 (H) 11/17/2022   TRIG 113 11/17/2022   HDL 37 (L) 11/17/2022   CHOLHDL 6.2 11/17/2022   VLDL 23 11/17/2022   LDLCALC 169 (H) 11/17/2022   LDLCALC 154 (H) 05/02/2022    Mental Status exam: Appearance: black female of average BMI, tight cap on head, seen sitting up in the day room today, talking with peers, and comes back to her room to talk  Eye contact: fair - improved from yesterday  Attitude towards examiner remains guarded, but more agreeable to conversation today  Psychomotor: psychomotor retardation present  Speech: reduced amount, short, clipped sentences, often one word responses  Language: no delays  Mood: alright  Affect: congruent, blunted but less irritable today   Thought content: Denying SI today, no HI, no delusions expressed  Thought Process: linear and organized  Perception: denying AVH, not RTIS  Insight: fair  Judgement: limited    Orientation: x3 Attention/Concentration: fair - volitionally disengaged at tiems  Memory/Cognition: not formally assessed; recent and remote memory grossly intact   Fund of Knowledge: Average      Musculoskeletal: Strength & Muscle Tone: within normal limits Gait & Station: normal Patient leans: N/A  Physical Exam Constitutional:      Appearance: Normal appearance.  HENT:     Head: Normocephalic and atraumatic.  Eyes:     Extraocular Movements: Extraocular movements intact.  Pulmonary:     Effort: Pulmonary effort is normal.  Abdominal:     General: There is no distension.  Musculoskeletal:        General: Normal range of motion.  Neurological:     General: No focal deficit present.  Mental Status: She is alert.    ROS Blood  pressure 124/80, pulse 79, temperature 98.1 F (36.7 C), temperature source Oral, resp. rate 16, height 5' 3 (1.6 m), weight 65.3 kg, SpO2 100%. Body mass index is 25.51 kg/m.   Treatment Plan Summary: Daily contact with patient to assess and evaluate symptoms and progress in treatment The patient is a 48 y.o. female with a psychiatric history most consistent with major depressive disorder, recurrent, severe, without psychotic features and stimulant use disorder, moderate. She has had recurrent low mood episodes from at least her 20s characterized by avolition, anhedonia, severely low mood, poor sleep, poor energy, poor concentration and suicidal thoughts with one suicide attempt (prior to this admission). She does not have any history of mania or psychosis. Reportedly had a history of PNES but no pseudoseizures here or in some years. She previously carried a GAD diagnosis but today voiced situational anxiety only, denied significant impact on life and denied panic attacks. She does likely meet criteria for at least a moderate stimulant use disorder given frequent usage of crack cocaine and some impact on life (worsening of depressive symptoms, relationship issues with family). She was not willing to discuss usage.    Yesterday (on intake) the patient was observably dysphoric in affect, presented as irritable and hopeless, denying belief that she will ever get better and continuing to voice suicidal ideation consistent with active MDE. Mirtazapine  was offered by admitting physician, but she declined it. Although patient did overdose on prozac  prior to this admission, there is some evidence per chart review that this medication was the most effective for her. As she was considered moderate-high risk of overdose in the future on any medication that she is given, opted to resume prozac  over mirtazapine , which is more lethal in overdose than prozac . She refused prozac  yesterday and refused mirtazapine  the  night before, overall continuing to voice hopelessness and that no medication or therapy will ever be helpful for her depressed mood. Today the patient was somewhat more engaged in exam, less irritable and with improved eye contact. Remains blunted in affect. She did take prozac  this morning and is agreeable to going to groups. Appears to specifically be for the purposes of leaving the hospital, however today she did present with some future orientation. 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     DSM-5 diagnoses: Major Depressive Disorder, recurrent, severe, without psychotic features  2.  Stimulant Use Disorder, moderate      Plan:   Legal Status: -Involuntary - second IVC filled out 02/18/24 at 10:15 am   Safety -q15 minute checks  -elopement, suicide and assault precautions  -daily vitals   Psychiatric Concerns  -Continue Prozac  20 mg daily                   -although she had an OD on this medicine, it is generally non-lethal in overdose and good historical evidence of benefit per chart review and patient    Substance use concerns  -Stimulant use disorder -currently minimizing and non-forthcoming. Will continue to discuss with patient    Nicotine  Replacement  Patch and gum offered    Medical concerns -Recent OD, no significant medical complications, supportive care only and immediate medical clearance -No chronic conditions requiring medicines; will CTM for needs    Additional PRNs: -Tylenol  tablets 650 mg every 6 hours as needed for pain -Maalox/Mylanta suspension 30 mL every 4  hours as needed for indigestion  -Milk of Magnesia 30 mL daily as needed for constipation   Labs -Reviewed as documented in HPI; non concerning    Psychosocial interventions  -Motivational interviewing  -daily medication management with psychiatry -Medication education regarding risks/benefits and alternatives -bedside psychotherapy  as indicated  -Patient will be encouraged to participate and engage with group therapy  -Appreciate SW assistance in coordinating safe disposition       Leita LOISE Arts, MD 02/19/2024, 8:15 AM

## 2024-02-19 NOTE — BH IP Treatment Plan (Signed)
 Interdisciplinary Treatment and Diagnostic Plan Update  02/19/2024 Time of Session: 10:00 AM Penny Berger MRN: [id number]  Principal Diagnosis: MDD (major depressive disorder), severe (HCC)  Secondary Diagnoses: Principal Problem:   MDD (major depressive disorder), severe (HCC)   Current Medications:  Current Facility-Administered Medications  Medication Dose Route Frequency Provider Last Rate Last Admin   acetaminophen  (TYLENOL ) tablet 650 mg  650 mg Oral Q6H PRN Coleman, Carolyn H, NP       alum & mag hydroxide-simeth (MAALOX/MYLANTA) 200-200-20 MG/5ML suspension 30 mL  30 mL Oral Q4H PRN Coleman, Carolyn H, NP       haloperidol  (HALDOL ) tablet 5 mg  5 mg Oral TID PRN Mardy Elveria DEL, NP       And   diphenhydrAMINE  (BENADRYL ) capsule 50 mg  50 mg Oral TID PRN Mardy Elveria DEL, NP       haloperidol  lactate (HALDOL ) injection 5 mg  5 mg Intramuscular TID PRN Mardy Elveria DEL, NP       And   diphenhydrAMINE  (BENADRYL ) injection 50 mg  50 mg Intramuscular TID PRN Mardy Elveria DEL, NP       And   LORazepam  (ATIVAN ) injection 2 mg  2 mg Intramuscular TID PRN Coleman, Carolyn H, NP       haloperidol  lactate (HALDOL ) injection 10 mg  10 mg Intramuscular TID PRN Mardy Elveria DEL, NP       And   diphenhydrAMINE  (BENADRYL ) injection 50 mg  50 mg Intramuscular TID PRN Mardy Elveria DEL, NP       And   LORazepam  (ATIVAN ) injection 2 mg  2 mg Intramuscular TID PRN Coleman, Carolyn H, NP       FLUoxetine  (PROZAC ) capsule 20 mg  20 mg Oral Daily Towana Leita SAILOR, MD   20 mg at 02/19/24 0804   hydrOXYzine  (ATARAX ) tablet 25 mg  25 mg Oral TID PRN Coleman, Carolyn H, NP   25 mg at 02/19/24 0804   influenza vac split trivalent PF (FLUZONE ) injection 0.5 mL  0.5 mL Intramuscular Tomorrow-1000 Trudy Carwin, NP       magnesium  hydroxide (MILK OF MAGNESIA) suspension 30 mL  30 mL Oral Daily PRN Mardy Elveria DEL, NP       nicotine  (NICODERM CQ  - dosed in mg/24 hours) patch 14 mg  14 mg  Transdermal Daily Pashayan, Alexander S, DO       traZODone  (DESYREL ) tablet 50 mg  50 mg Oral QHS PRN Coleman, Carolyn H, NP       PTA Medications: Medications Prior to Admission  Medication Sig Dispense Refill Last Dose/Taking   cyclobenzaprine (FLEXERIL) 10 MG tablet Take 10 mg by mouth at bedtime. (Patient not taking: Reported on 02/17/2024)      FLUoxetine  (PROZAC ) 20 MG capsule Take 1 capsule (20 mg total) by mouth daily. (Patient not taking: Reported on 02/17/2024) 30 capsule 0    hydrOXYzine  (ATARAX ) 50 MG tablet Take 1 tablet (50 mg total) by mouth 3 (three) times daily as needed for anxiety. (Patient not taking: Reported on 02/17/2024) 30 tablet 0    medroxyPROGESTERone (DEPO-PROVERA) 150 MG/ML injection Inject 150 mg into the muscle every 3 (three) months.      propranolol  (INDERAL ) 10 MG tablet Take 1 tablet (10 mg total) by mouth daily as needed (anxiety, irritability, anxiety before bed affecting sleep). (Patient not taking: Reported on 02/17/2024) 30 tablet 0    traZODone  (DESYREL ) 50 MG tablet Take 1 tablet (50 mg total) by mouth at  bedtime. (Patient not taking: Reported on 02/17/2024) 30 tablet 0     Patient Stressors: Substance abuse    Patient Strengths: Capable of independent living  Supportive family/friends   Treatment Modalities: Medication Management, Group therapy, Case management,  1 to 1 session with clinician, Psychoeducation, Recreational therapy.   Physician Treatment Plan for Primary Diagnosis: MDD (major depressive disorder), severe (HCC) Long Term Goal(s):     Short Term Goals:    Medication Management: Evaluate patient's response, side effects, and tolerance of medication regimen.  Therapeutic Interventions: 1 to 1 sessions, Unit Group sessions and Medication administration.  Evaluation of Outcomes: Not Progressing  Physician Treatment Plan for Secondary Diagnosis: Principal Problem:   MDD (major depressive disorder), severe (HCC)  Long Term Goal(s):      Short Term Goals:       Medication Management: Evaluate patient's response, side effects, and tolerance of medication regimen.  Therapeutic Interventions: 1 to 1 sessions, Unit Group sessions and Medication administration.  Evaluation of Outcomes: Not Progressing   RN Treatment Plan for Primary Diagnosis: MDD (major depressive disorder), severe (HCC) Long Term Goal(s): Knowledge of disease and therapeutic regimen to maintain health will improve  Short Term Goals: Ability to remain free from injury will improve, Ability to verbalize frustration and anger appropriately will improve, Ability to demonstrate self-control, Ability to participate in decision making will improve, Ability to verbalize feelings will improve, Ability to disclose and discuss suicidal ideas, Ability to identify and develop effective coping behaviors will improve, and Compliance with prescribed medications will improve  Medication Management: RN will administer medications as ordered by provider, will assess and evaluate patient's response and provide education to patient for prescribed medication. RN will report any adverse and/or side effects to prescribing provider.  Therapeutic Interventions: 1 on 1 counseling sessions, Psychoeducation, Medication administration, Evaluate responses to treatment, Monitor vital signs and CBGs as ordered, Perform/monitor CIWA, COWS, AIMS and Fall Risk screenings as ordered, Perform wound care treatments as ordered.  Evaluation of Outcomes: Not Progressing   LCSW Treatment Plan for Primary Diagnosis: MDD (major depressive disorder), severe (HCC) Long Term Goal(s): Safe transition to appropriate next level of care at discharge, Engage patient in therapeutic group addressing interpersonal concerns.  Short Term Goals: Engage patient in aftercare planning with referrals and resources, Increase social support, Increase ability to appropriately verbalize feelings, Increase emotional  regulation, Facilitate acceptance of mental health diagnosis and concerns, Facilitate patient progression through stages of change regarding substance use diagnoses and concerns, Identify triggers associated with mental health/substance abuse issues, and Increase skills for wellness and recovery  Therapeutic Interventions: Assess for all discharge needs, 1 to 1 time with Social worker, Explore available resources and support systems, Assess for adequacy in community support network, Educate family and significant other(s) on suicide prevention, Complete Psychosocial Assessment, Interpersonal group therapy.  Evaluation of Outcomes: Not Progressing   Progress in Treatment: Attending groups: attended some groups Participating in groups: Yes Taking medication as prescribed: Yes. Toleration medication: Yes. Family/Significant other contact made: patient declined consents Patient understands diagnosis: Yes. Discussing patient identified problems/goals with staff: Yes. Medical problems stabilized or resolved: Yes. Denies suicidal/homicidal ideation: Yes. Issues/concerns per patient self-inventory: No.  New problem(s) identified:  No  New Short Term/Long Term Goal(s):    medication stabilization, elimination of SI thoughts, development of comprehensive mental wellness plan.    Patient Goals:  I really don't know.   Discharge Plan or Barriers:  Patient recently admitted. CSW will continue to follow and assess for  appropriate referrals and possible discharge planning.    Reason for Continuation of Hospitalization: Depression Medication stabilization Suicidal ideation  Estimated Length of Stay:  5 - 7 days  Last 3 [place] Suicide Severity Risk Score: Flowsheet Row [facility] (Current) [facility]16/2024 in [facility] Ad[facility] (Discharged[facility]  C-SSRS RISK CATEGORY High Risk High Risk High Risk    Last PHQ 2/9 Scores:     No data to display          Scribe for Treatment Team: Qasim Diveley O Kamara Allan, LCSWA 02/19/2024 12:35 PM

## 2024-02-19 NOTE — Progress Notes (Signed)
   02/19/24 2144  Psych Admission Type (Psych Patients Only)  Admission Status Involuntary  Psychosocial Assessment  Patient Complaints Depression  Eye Contact Brief  Facial Expression Flat  Affect Appropriate to circumstance  Speech Logical/coherent  Interaction Guarded  Motor Activity Other (Comment) (WDL)  Appearance/Hygiene Unremarkable  Behavior Characteristics Appropriate to situation  Mood Depressed  Thought Process  Coherency WDL  Content WDL  Delusions None reported or observed  Perception WDL  Hallucination None reported or observed  Judgment Impaired  Confusion None  Danger to Self  Current suicidal ideation? Denies  Agreement Not to Harm Self Yes  Description of Agreement verbal  Danger to Others  Danger to Others None reported or observed

## 2024-02-19 NOTE — Group Note (Signed)
 Date:  02/19/2024 Time:  9:11 AM  Group Topic/Focus:  Goals Group:   The focus of this group is to help patients establish daily goals to achieve during treatment and discuss how the patient can incorporate goal setting into their daily lives to aide in recovery.    Participation Level:  Active  Participation Quality:  Appropriate  Affect:  Appropriate  Cognitive:  Appropriate  Insight: Appropriate  Engagement in Group:  Engaged  Modes of Intervention:  Discussion  Additional Comments:  pt stated that she wants to stay awake throughout the day  Nat Rummer 02/19/2024, 9:11 AM

## 2024-02-19 NOTE — Progress Notes (Signed)
(  Sleep Hours) - 8.5 (Any PRNs that were needed, meds refused, or side effects to meds)- No PRN meds given, no meds refused.  (Any disturbances and when (visitation, over night)- None  (Concerns raised by the patient)- None  (SI/HI/AVH)- Denies SI/HI/AVH

## 2024-02-19 NOTE — Progress Notes (Signed)
 D: Patient is alert, oriented, and cooperative. Denies SI, HI, AVH, and verbally contracts for safety. Patient reports she slept good last night without sleeping medication. Patient reports her appetite as good, energy level as normal, and concentration as good. Patient rates her depression 6/10, hopelessness 0/10, and anxiety 0/10. Patient denies physical symptoms/pain. Patient seen in the milieu more today. Patient reports anxiety.    A: Scheduled medications administered per MD order. PRN hydroxyzine  administered. Support provided. Patient educated on safety on the unit and medications. Routine safety checks every 15 minutes. Patient stated understanding to tell nurse about any new physical symptoms. Patient understands to tell staff of any needs.     R: No adverse drug reactions noted. Patient remains safe at this time and will continue to monitor.    02/19/24 0800  Psych Admission Type (Psych Patients Only)  Admission Status Involuntary  Psychosocial Assessment  Patient Complaints Anxiety  Eye Contact Fair  Facial Expression Flat  Affect Depressed;Anxious  Speech Logical/coherent  Interaction Guarded  Motor Activity Other (Comment) (WNL)  Appearance/Hygiene Unremarkable  Behavior Characteristics Cooperative;Calm  Mood Depressed;Anxious  Thought Process  Coherency WDL  Content WDL  Delusions None reported or observed  Perception WDL  Hallucination None reported or observed  Judgment Impaired  Confusion None  Danger to Self  Current suicidal ideation? Denies  Agreement Not to Harm Self Yes  Description of Agreement verbal  Danger to Others  Danger to Others None reported or observed

## 2024-02-19 NOTE — Group Note (Signed)
 Recreation Therapy Group Note   Group Topic:Team Building  Group Date: 02/19/2024 Start Time: 0935 End Time: 1005 Facilitators: Curtez Brallier-McCall, LRT,CTRS Location: [address] Dayroom   Group Topic: Communication, Team Building, Problem Solving  Goal Area(s) Addresses:  Patient will effectively work with peer towards shared goal.  Patient will identify skills used to make activity successful.  Patient will share challenges and verbalize solution-driven approaches used. Patient will identify how skills used during activity can be used to reach post d/c goals.   Behavioral Response:   Intervention: STEM Activity   Activity: [employer]. Patients were provided the following materials: 4 drinking straws, 5 rubber bands, 5 paper clips, 2 index cards and 2 drinking cups. Using the provided materials patients were asked to build a launching mechanism to launch a ping pong ball across the room, approximately 10 feet. Patients were divided into teams of 3-5. Instructions required all materials be incorporated into the device, functionality of items left to the peer group's discretion.  Education: Pharmacist, community, Scientist, physiological, Air cabin crew, Building control surveyor.   Education Outcome: Acknowledges education/In group clarification    Affect/Mood: N/A   Participation Level: Did not attend    Clinical Observations/Individualized Feedback:      Plan: Continue to engage patient in RT group sessions 2-3x/week.   Aizik Reh-McCall, LRT,CTRS 02/19/2024 12:14 PM

## 2024-02-19 NOTE — Progress Notes (Signed)
   02/18/24 2232  Psych Admission Type (Psych Patients Only)  Admission Status Involuntary  Psychosocial Assessment  Patient Complaints None  Eye Contact Brief  Facial Expression Flat  Affect Depressed  Speech Logical/coherent  Interaction Isolative;Minimal  Motor Activity Other (Comment) (WDL)  Appearance/Hygiene Unremarkable  Behavior Characteristics Calm;Cooperative  Mood Depressed  Thought Process  Coherency WDL  Content WDL  Delusions None reported or observed  Perception WDL  Hallucination None reported or observed  Judgment Impaired  Confusion None  Danger to Self  Current suicidal ideation? Denies  Agreement Not to Harm Self Yes  Description of Agreement Verbal  Danger to Others  Danger to Others None reported or observed

## 2024-02-19 NOTE — Group Note (Signed)
 Date:  02/19/2024 Time:  4:07 PM  Group Topic/Focus:  Medication Side effect    Participation Level:  Active  Participation Quality:  Appropriate  Affect:  Appropriate  Cognitive:  Alert and Appropriate  Insight: Appropriate  Engagement in Group:  Engaged  Modes of Intervention:  Education    Penny Berger Lowers 02/19/2024, 4:07 PM

## 2024-02-19 NOTE — Plan of Care (Signed)

## 2024-02-20 NOTE — [facility] Group Notes (Signed)
 [facility] Group Notes:  (Nursing/MHT/Case Management/Adjunct)  Date:  02/20/2024  Time:  8:49 PM  Type of Therapy:  Wrap-up group  Participation Level:  Active  Participation Quality:  Appropriate  Affect:  Appropriate  Cognitive:  Appropriate  Insight:  Appropriate  Engagement in Group:  Engaged  Modes of Intervention:  Education  Summary of Progress/Problems:PT reports no goal. Rated day 5/10.  Grayce LITTIE Essex 02/20/2024, 8:49 PM

## 2024-02-20 NOTE — Group Note (Signed)
 Date:  02/20/2024 Time:  9:03 AM  Group Topic/Focus:  Goals Group:   The focus of this group is to help patients establish daily goals to achieve during treatment and discuss how the patient can incorporate goal setting into their daily lives to aide in recovery.    Participation Level:  Active  Participation Quality:  Appropriate  Affect:  Appropriate  Cognitive:  Appropriate  Insight: Appropriate  Engagement in Group:  Engaged  Modes of Intervention:  Discussion  Additional Comments:  NA  Ruble Pumphrey A Mikeria Valin 02/20/2024, 9:03 AM

## 2024-02-20 NOTE — Progress Notes (Signed)
 [facility] MD Progress Note  02/20/2024 8:06 AM Penny Berger  MRN:  [id number]  Principal Problem: MDD (major depressive disorder), severe (HCC) Diagnosis: Principal Problem:   MDD (major depressive disorder), severe (HCC)  Total Time spent with patient: 25 minutes   Identifying Information and Past Psychiatric History:  The patient is a 48 y.o. 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 2:30 pm on 9/2). On arrival endorsing SI and chest pain. She was transferred to [facility] overnight 9/3-9/4 after medical clearance.  Psychiatric history is notable for recurrent major depressive episodes with 4 lifetime hospitalizations for symptoms including anergia, anhedonia, amotivation, increased tearfulness, decreased energy and concentration and SI. Prior to this hospitalization she had never had a suicide attempt, and OD on this hospitalization was an attempt vs gesture given low amount ingested (4 tablets of 20 mg prozac ). No history of mania or psychosis. Historical diagnoses of GAD and panic, although patient has since reported experiencing only mild situational anxiety not impairing to life. She does reportedly have a history of PNES, although has not had pseudoseizures in a number of years. Depression has been largely refractory to treatment with numerous medicines tried as documented below. However this is confounded by poor outpatient adherence with reports of stopping all medications after hospitalization. Finally, the patient has had frequent usage of crack cocaine which has likely worsened depression to some degree.   Previous psychotropic medications include: Venlafaxine , fluoxetine , sertraline, bupropion, alprazolam, hydroxyzine , lithium ,  topiramate,  aripiprazole , and trazodone .   Interval events: Patient has been documented to be attending selective groups with appropriate engagement. Remains depressed in affect and guarded in interactions with staff, however cooperative with care and taking medications. Denying SI, HI and AVH. Slept 10.25 hrs overnight.   Interview: Today the patient reports she is alright. States she has been going to the groups and taking her medication and is hopeful to get out of here by Monday. In general, she feels that she does need to work on impulsivity. Notes that taking the 4 tablets of the prozac  was impulsive and she really regrets doing this as it landed her here. Patient reports she has been dealing with depression for 30 years and has not ever acted to kill herself and did not think she would kill herself that time. She is thinking about how to change her behaviors outside the hospital so that she makes better choices in the future and is thinking about moving to [place]  where her sister lives. She would like a fresh start away from the stressors of home. Also notes that a fresh start may be helpful in cutting out cocaine, which she agrees has been a problem for her. She would like to stop but does not feel that inpatient programming was ever helpful and wants to do this on her own. Not interested in rehab at this time. States she is still planning on going back to her mom's house and resuming work after she leaves the hospital this time while she figures out if going to [place]  is a good idea. Denying SI, HI and AVH.   Past Medical History:  Past Medical History:  Diagnosis Date   Seizures (HCC)    Syncope     Past Surgical History:  Procedure Laterality Date   APPENDECTOMY     CESAREAN SECTION     Family History: History reviewed. No pertinent family history.  Social History:  Social History   Substance and Sexual Activity  Alcohol Use Not Currently     Social History   Substance  and Sexual Activity  Drug Use Yes   Types: Cocaine    Social History   Socioeconomic History   Marital status: Single    Spouse name: Not on file   Number of children: Not on file   Years of education: Not on file   Highest education level: Not on file  Occupational History   Not on file  Tobacco Use   Smoking status: Every Day    Current packs/day: 0.50    Types: Cigarettes   Smokeless tobacco: Never  Vaping Use   Vaping status: Never Used  Substance and Sexual Activity   Alcohol use: Not Currently   Drug use: Yes    Types: Cocaine   Sexual activity: Yes  Other Topics Concern   Not on file  Social History Narrative   Not on file   Social Drivers of Health   Financial Resource Strain: Not on file  Food Insecurity: No Food Insecurity (02/17/2024)   Hunger Vital Sign    Worried About Running Out of Food in the Last Year: Never true    Ran Out of Food in the Last Year: Never true  Transportation Needs: No Transportation Needs (02/17/2024)   PRAPARE - Administrator, Civil Service (Medical): No    Lack of Transportation (Non-Medical): No  Physical Activity: Not on file  Stress: Not on file  Social Connections: Not on file   Additional Social History:  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 Denies close social supports aside from family. Does not like her job but feels she needs to work to make money and cannot quit until she has another job lined up. Reports crack cocaine usage but did not discuss this in detail.   Current Medications: Current Facility-Administered Medications  Medication Dose Route Frequency Provider Last Rate Last Admin   acetaminophen  (TYLENOL ) tablet 650 mg  650 mg Oral Q6H PRN Coleman, Carolyn H, NP       alum & mag hydroxide-simeth (MAALOX/MYLANTA) 200-200-20 MG/5ML suspension 30 mL  30 mL Oral Q4H PRN Coleman, Carolyn H, NP       haloperidol  (HALDOL ) tablet 5 mg  5 mg Oral TID PRN Mardy Elveria DEL, NP       And    diphenhydrAMINE  (BENADRYL ) capsule 50 mg  50 mg Oral TID PRN Mardy Elveria DEL, NP       haloperidol  lactate (HALDOL ) injection 5 mg  5 mg Intramuscular TID PRN Mardy Elveria DEL, NP       And   diphenhydrAMINE  (BENADRYL ) injection 50 mg  50 mg Intramuscular TID PRN Mardy Elveria DEL, NP       And   LORazepam  (ATIVAN ) injection 2 mg  2 mg Intramuscular TID PRN Coleman, Carolyn H, NP       haloperidol  lactate (HALDOL ) injection 10 mg  10 mg Intramuscular TID PRN Mardy Elveria DEL, NP       And   diphenhydrAMINE  (BENADRYL ) injection 50 mg  50 mg Intramuscular TID PRN Mardy Elveria DEL, NP       And   LORazepam  (ATIVAN ) injection 2 mg  2 mg Intramuscular TID PRN Mardy Elveria DEL, NP  FLUoxetine  (PROZAC ) capsule 20 mg  20 mg Oral Daily Towana Leita SAILOR, MD   20 mg at 02/19/24 9195   hydrOXYzine  (ATARAX ) tablet 25 mg  25 mg Oral TID PRN Coleman, Carolyn H, NP   25 mg at 02/19/24 0804   influenza vac split trivalent PF (FLUZONE ) injection 0.5 mL  0.5 mL Intramuscular Tomorrow-1000 Trudy Carwin, NP       magnesium  hydroxide (MILK OF MAGNESIA) suspension 30 mL  30 mL Oral Daily PRN Mardy Elveria DEL, NP       nicotine  (NICODERM CQ  - dosed in mg/24 hours) patch 14 mg  14 mg Transdermal Daily Pashayan, Alexander S, DO       traZODone  (DESYREL ) tablet 50 mg  50 mg Oral QHS PRN Mardy Elveria DEL, NP        Lab Results: No results found for this or any previous visit (from the past 48 hours).  Blood Alcohol level:  Lab Results  Component Value Date   [facility] <15 02/16/2024   ETH <10 11/17/2022    Metabolic Disorder Labs: Lab Results  Component Value Date   HGBA1C 6.1 (H) 11/17/2022   MPG 128 11/17/2022   MPG 116.89 05/02/2022   Lab Results  Component Value Date   PROLACTIN 13.7 11/17/2022   PROLACTIN 5.0 05/02/2022   Lab Results  Component Value Date   CHOL 229 (H) 11/17/2022   TRIG 113 11/17/2022   HDL 37 (L) 11/17/2022   CHOLHDL 6.2 11/17/2022   VLDL 23 11/17/2022    LDLCALC 169 (H) 11/17/2022   LDLCALC 154 (H) 05/02/2022    Mental Status exam: Appearance: black female of average BMI, tight cap on head, seen sitting up in the day room today, talking with peers, and comes to hallway to talk  Eye contact: good - improved  Attitude towards examiner: less irritable and more cooperative with questions today Psychomotor: psychomotor retardation no longer notable - improved  Speech: today was largely normal in rate and rhythm, still soft, but significantly improved  Language: no delays  Mood: alright  Affect: congruent, largely neutral today  Thought content: Denying SI today, no HI, no delusions expressed  Thought Process: linear and organized  Perception: denying AVH, not RTIS  Insight: fair  Judgement: limited    Orientation: x3 Attention/Concentration: fair - volitionally disengaged at tiems  Memory/Cognition: not formally assessed; recent and remote memory grossly intact   Fund of Knowledge: Average      Musculoskeletal: Strength & Muscle Tone: within normal limits Gait & Station: normal Patient leans: N/A  Physical Exam Constitutional:      Appearance: Normal appearance.  HENT:     Head: Normocephalic and atraumatic.  Eyes:     Extraocular Movements: Extraocular movements intact.  Pulmonary:     Effort: Pulmonary effort is normal.  Abdominal:     General: There is no distension.  Musculoskeletal:        General: Normal range of motion.  Neurological:     General: No focal deficit present.     Mental Status: She is alert.    ROS Blood pressure (!) 141/92, pulse 75, temperature 98.1 F (36.7 C), temperature source Oral, resp. rate 16, height 5' 3 (1.6 m), weight 65.3 kg, SpO2 99%. Body mass index is 25.51 kg/m.   Treatment Plan Summary: Daily contact with patient to assess and evaluate symptoms and progress in treatment The patient is a 48 y.o. female with a psychiatric history most consistent with major depressive  disorder,  recurrent, severe, without psychotic features and stimulant use disorder, moderate. She has had recurrent low mood episodes from at least her 20s characterized by avolition, anhedonia, severely low mood, poor sleep, poor energy, poor concentration and suicidal thoughts with one suicide attempt (prior to this admission). She does not have any history of mania or psychosis. Reportedly had a history of PNES but no pseudoseizures here or in some years. She previously carried a GAD diagnosis but today voiced situational anxiety only, denied significant impact on life and denied panic attacks. She does likely meet criteria for at least a moderate stimulant use disorder given frequent usage of crack cocaine and some impact on life (worsening of depressive symptoms, relationship issues with family).    Patient initially was non-cooperative and presented as dysphoric, declining medications. Yesterday, she did take prozac  and was somewhat more engaged in exam, less irritable and with improved eye contact. She began to attend some groups and was expressing future orentation. 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 Today she continued to demonstrate improvements in affect, which was less blunted. She was more engaged in interview, overall less irritable and more open to discussing ongoing concerns and ways to help herself once she leaves the hospital. Continuing to deny SI and today was expressing clear future orientation with both short and long-term plans and goals. If she continues to do well throughout the rest of the weekend, tentative plan for discharge home on Monday.     DSM-5 diagnoses: Major Depressive Disorder, recurrent, severe, without psychotic features  2.  Stimulant Use Disorder, moderate      Plan:   Legal Status: -Involuntary - second IVC filled out 02/18/24 at 10:15 am   Safety -q15 minute checks  -elopement, suicide and  assault precautions  -daily vitals   Psychiatric Concerns  -Continue Prozac  20 mg daily                   -although she had an OD on this medicine, it is generally non-lethal in overdose and good historical evidence of benefit per chart review and patient    Substance use concerns  -Stimulant use disorder -currently minimizing and non-forthcoming. Will continue to discuss with patient    Nicotine  Replacement  Patch and gum offered    Medical concerns -Recent OD, no significant medical complications, supportive care only and immediate medical clearance -No chronic conditions requiring medicines; will CTM for needs    Additional PRNs: -Tylenol  tablets 650 mg every 6 hours as needed for pain -Maalox/Mylanta suspension 30 mL every 4 hours as needed for indigestion  -Milk of Magnesia 30 mL daily as needed for constipation   Labs -Reviewed as documented in HPI; non concerning    Psychosocial interventions  -Motivational interviewing  -daily medication management with psychiatry -Medication education regarding risks/benefits and alternatives -bedside psychotherapy as indicated  -Patient will be encouraged to participate and engage with group therapy  -Appreciate SW assistance in coordinating safe disposition       Leita LOISE Arts, MD 02/20/2024, 8:06 AM

## 2024-02-20 NOTE — Progress Notes (Signed)
   02/20/24 0845  Psych Admission Type (Psych Patients Only)  Admission Status Involuntary  Psychosocial Assessment  Patient Complaints Depression  Eye Contact Brief  Facial Expression Flat  Affect Appropriate to circumstance  Speech Logical/coherent  Interaction Guarded  Motor Activity Other (Comment) (WDL fot pt)  Appearance/Hygiene Unremarkable  Behavior Characteristics Appropriate to situation  Mood Depressed  Thought Process  Coherency WDL  Content WDL  Delusions None reported or observed  Perception WDL  Hallucination None reported or observed  Judgment Poor  Confusion None  Danger to Self  Current suicidal ideation? Denies  Agreement Not to Harm Self Yes  Description of Agreement verbal  Danger to Others  Danger to Others None reported or observed

## 2024-02-20 NOTE — Plan of Care (Signed)
   Problem: Education: Goal: Mental status will improve Outcome: Progressing   Problem: Education: Goal: Verbalization of understanding the information provided will improve Outcome: Progressing

## 2024-02-20 NOTE — Hospital Course (Signed)
  On intake the patient was observably dysphoric in affect, presented as irritable and hopeless, denying belief that she will ever get better and continuing to voice suicidal ideation consistent with active MDE. Mirtazapine  was offered by admitting physician, but she declined it. Although patient did overdose on prozac  prior to this admission, there is some evidence per chart review that this medication was the most effective for her. As she was considered moderate-high risk of overdose in the future on any medication that she is given, opted to resume prozac  over mirtazapine , which is more lethal in overdose than prozac . She refused prozac  initially and refused mirtazapine  the night before, overall continuing to voice hopelessness and that no medication or therapy will ever be helpful for her depressed mood.

## 2024-02-20 NOTE — Progress Notes (Signed)
   02/20/24 2009  Psych Admission Type (Psych Patients Only)  Admission Status Involuntary  Psychosocial Assessment  Patient Complaints Depression  Eye Contact Brief  Facial Expression Flat  Affect Appropriate to circumstance  Speech Logical/coherent  Interaction Assertive  Motor Activity Other (Comment) (WDL)  Appearance/Hygiene Unremarkable  Behavior Characteristics Appropriate to situation  Mood Depressed  Thought Process  Coherency WDL  Content WDL  Delusions None reported or observed  Perception WDL  Hallucination None reported or observed  Judgment Poor  Confusion None  Danger to Self  Current suicidal ideation? Denies  Agreement Not to Harm Self Yes  Description of Agreement verbal  Danger to Others  Danger to Others None reported or observed

## 2024-02-20 NOTE — Group Note (Signed)
 Date:  02/20/2024 Time:  12:19 AM  Group Topic/Focus:  Wrap-Up Group:   The focus of this group is to help patients review their daily goal of treatment and discuss progress on daily workbooks.    Additional Comments:  Pt was encouraged, but opted out of participating in the AA meeting this evening.   Penny Berger 02/20/2024, 12:19 AM

## 2024-02-20 NOTE — Plan of Care (Signed)
  Problem: Education: Goal: Knowledge of [facility] Education information/materials will improve Outcome: Completed/Met   Problem: Activity: Goal: Interest or engagement in activities will improve Outcome: Progressing Goal: Sleeping patterns will improve Outcome: Progressing

## 2024-02-20 NOTE — Progress Notes (Signed)
(  Sleep Hours) - 10.25 hours (Any PRNs that were needed, meds refused, or side effects to meds)-  None (Any disturbances and when (visitation, over night)- None (Concerns raised by the patient)-  None (SI/HI/AVH)-  Denies

## 2024-02-20 NOTE — Group Note (Signed)
 Date:  02/20/2024 Time:  4:27 PM  Group Topic/Focus:  Managing Feelings:   The focus of this group is to identify what feelings patients have difficulty handling and develop a plan to handle them in a healthier way upon discharge.    Participation Level:  Active  Participation Quality:  Appropriate and Attentive  Affect:  Appropriate  Cognitive:  Alert and Appropriate  Insight: Appropriate  Engagement in Group:  Engaged  Modes of Intervention:  Activity, Discussion, and Education   Huel Mall 02/20/2024, 4:27 PM

## 2024-02-21 DIAGNOSIS — F1520 Other stimulant dependence, uncomplicated: Secondary | ICD-10-CM

## 2024-02-21 NOTE — Group Note (Signed)
 Date:  02/21/2024 Time:  9:31 AM  Group Topic/Focus:  Goals Group:   The focus of this group is to help patients establish daily goals to achieve during treatment and discuss how the patient can incorporate goal setting into their daily lives to aide in recovery.    Participation Level:  Active  Participation Quality:  Appropriate  Affect:  Appropriate  Cognitive:  Alert and Appropriate  Insight: Appropriate and Improving  Engagement in Group:  Engaged and Improving  Modes of Intervention:  Discussion and Exploration  Additional Comments:  Pt attended and particpated in goals group.  Kristi HERO Jadwiga Faidley 02/21/2024, 9:31 AM

## 2024-02-21 NOTE — Progress Notes (Signed)
 [facility] MD Progress Note  02/21/2024 7:52 AM Penny Berger  MRN:  [id number]  Principal Problem: MDD (major depressive disorder), severe (HCC) Diagnosis: Principal Problem:   MDD (major depressive disorder), severe (HCC)  Total Time spent with patient: 25 minutes   Identifying Information and Past Psychiatric History:  The patient is a 48 y.o. 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 2:30 pm on 9/2). On arrival endorsing SI and chest pain. She was transferred to [facility] overnight 9/3-9/4 after medical clearance.  Psychiatric history is notable for recurrent major depressive episodes with 4 lifetime hospitalizations for symptoms including anergia, anhedonia, amotivation, increased tearfulness, decreased energy and concentration and SI. Prior to this hospitalization she had never had a suicide attempt, and OD on this hospitalization was an attempt vs gesture given low amount ingested (4 tablets of 20 mg prozac ). No history of mania or psychosis. Historical diagnoses of GAD and panic, although patient has since reported experiencing only mild situational anxiety not impairing to life. She does reportedly have a history of PNES, although has not had pseudoseizures in a number of years. Depression has been largely refractory to treatment with numerous medicines tried as documented below. However this is confounded by poor outpatient adherence with reports of stopping all medications after hospitalization. Finally, the patient has had frequent usage of crack cocaine which has likely worsened depression to some degree.   Previous psychotropic medications include: Venlafaxine , fluoxetine , sertraline, bupropion, alprazolam, hydroxyzine , lithium ,  topiramate,  aripiprazole , and trazodone .     Interval events: Patient has been documented to be endorsing depressed mood and remains guarded at times, but denying SI, HI and AVH. Attending all groups with good participation and engagement. Slept 8.25 hrs overnight. No PRNs for agitation required, did take PRN trazodone  and vistaril  in the evening.  Interview: Today the patient states she is alright. She was hoping to leave the hospital today but is okay with going tomorrow. She did disclose ongoing thoughts of having nothing in her life to live for, but denies SI and worries that by expressing this she will be forced to stay longer. Reassured patient that the plan is still for tomorrow. We discussed in detail options for outpatient management of her concerns, including possibility of reconnecting with church. She is not interested in that at this time, but does feel that going to a therapist would be helpful. She did have a therapy appointment scheduled just prior to coming into the hospital but missed it that day because of using cocaine. She knows that this has been a negative influence in her life and wants to stop using for both her mental health and because the person she gets the substance from is dangerous to her. We discussed how cocaine can worsen depression and she voiced understanding. She does not want a rehab because she wants to be able to go back and work, but she does feel more serious about quitting cocaine for her own health. We discussed trying the medication for at least  few months this time to see if it has any benefit and she was agreeable to give it a try this time. No SI, HI or AVH today.    Past Medical History:  Past Medical History:  Diagnosis  Date   Seizures (HCC)    Syncope     Past Surgical History:  Procedure Laterality Date   APPENDECTOMY     CESAREAN SECTION     Family History: History reviewed. No pertinent family history.  Social History:  Social  History   Substance and Sexual Activity  Alcohol Use Not Currently     Social History   Substance and Sexual Activity  Drug Use Yes   Types: Cocaine    Social History   Socioeconomic History   Marital status: Single    Spouse name: Not on file   Number of children: Not on file   Years of education: Not on file   Highest education level: Not on file  Occupational History   Not on file  Tobacco Use   Smoking status: Every Day    Current packs/day: 0.50    Types: Cigarettes   Smokeless tobacco: Never  Vaping Use   Vaping status: Never Used  Substance and Sexual Activity   Alcohol use: Not Currently   Drug use: Yes    Types: Cocaine   Sexual activity: Yes  Other Topics Concern   Not on file  Social History Narrative   Not on file   Social Drivers of Health   Financial Resource Strain: Not on file  Food Insecurity: No Food Insecurity (02/17/2024)   Hunger Vital Sign    Worried About Running Out of Food in the Last Year: Never true    Ran Out of Food in the Last Year: Never true  Transportation Needs: No Transportation Needs (02/17/2024)   PRAPARE - Administrator, Civil Service (Medical): No    Lack of Transportation (Non-Medical): No  Physical Activity: Not on file  Stress: Not on file  Social Connections: Not on file   Additional Social History:  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 Denies close social supports aside from family. Does not like her job but feels she needs to work to make money and cannot quit until she has another job lined up. Reports crack cocaine usage but did not discuss this in detail.   Current Medications: Current Facility-Administered Medications  Medication Dose Route Frequency Provider Last Rate Last Admin   acetaminophen  (TYLENOL ) tablet 650 mg  650 mg Oral Q6H PRN Coleman, Carolyn H, NP       alum & mag hydroxide-simeth (MAALOX/MYLANTA) 200-200-20 MG/5ML suspension 30 mL  30 mL Oral Q4H PRN Coleman, Carolyn  H, NP       haloperidol  (HALDOL ) tablet 5 mg  5 mg Oral TID PRN Mardy Elveria DEL, NP       And   diphenhydrAMINE  (BENADRYL ) capsule 50 mg  50 mg Oral TID PRN Mardy Elveria DEL, NP       haloperidol  lactate (HALDOL ) injection 5 mg  5 mg Intramuscular TID PRN Mardy Elveria DEL, NP       And   diphenhydrAMINE  (BENADRYL ) injection 50 mg  50 mg Intramuscular TID PRN Mardy Elveria DEL, NP       And   LORazepam  (ATIVAN ) injection 2 mg  2 mg Intramuscular TID PRN Mardy Elveria DEL, NP       haloperidol  lactate (HALDOL ) injection 10 mg  10 mg Intramuscular TID PRN Mardy Elveria DEL, NP       And   diphenhydrAMINE  (BENADRYL ) injection 50 mg  50 mg Intramuscular TID PRN Mardy Elveria DEL, NP       And   LORazepam  (ATIVAN )  injection 2 mg  2 mg Intramuscular TID PRN Coleman, Carolyn H, NP       FLUoxetine  (PROZAC ) capsule 20 mg  20 mg Oral Daily Towana Penny SAILOR, MD   20 mg at 02/20/24 9175   hydrOXYzine  (ATARAX ) tablet 25 mg  25 mg Oral TID PRN Coleman, Carolyn H, NP   25 mg at 02/20/24 2058   influenza vac split trivalent PF (FLUZONE ) injection 0.5 mL  0.5 mL Intramuscular Tomorrow-1000 Trudy Carwin, NP       magnesium  hydroxide (MILK OF MAGNESIA) suspension 30 mL  30 mL Oral Daily PRN Mardy Elveria DEL, NP       nicotine  (NICODERM CQ  - dosed in mg/24 hours) patch 14 mg  14 mg Transdermal Daily Pashayan, Alexander S, DO       traZODone  (DESYREL ) tablet 50 mg  50 mg Oral QHS PRN Coleman, Carolyn H, NP   50 mg at 02/20/24 2058    Lab Results: No results found for this or any previous visit (from the past 48 hours).  Blood Alcohol level:  Lab Results  Component Value Date   [facility] <15 02/16/2024   ETH <10 11/17/2022    Metabolic Disorder Labs: Lab Results  Component Value Date   HGBA1C 6.1 (H) 11/17/2022   MPG 128 11/17/2022   MPG 116.89 05/02/2022   Lab Results  Component Value Date   PROLACTIN 13.7 11/17/2022   PROLACTIN 5.0 05/02/2022   Lab Results  Component Value Date   CHOL  229 (H) 11/17/2022   TRIG 113 11/17/2022   HDL 37 (L) 11/17/2022   CHOLHDL 6.2 11/17/2022   VLDL 23 11/17/2022   LDLCALC 169 (H) 11/17/2022   LDLCALC 154 (H) 05/02/2022    Mental Status exam: Appearance: black female of average BMI, tight cap on head, seen sitting up in the day room today, talking with peers, and comes to room to talk, blanket on shoulders Eye contact: good  Attitude towards examiner: less irritable and more cooperative, today was more forthcoming about recent stressors  Psychomotor: no agitation or retardation Speech: today remained largely normal in rate and rhythm Language: no delays  Mood: alright  Affect: congruent, largely neutral today  Thought content: Denying SI today, no HI, no delusions expressed  Thought Process: linear and organized  Perception: denying AVH, not RTIS  Insight: fair to good - improving   Judgement: fair - improved; now discussing more reasonable outpatient plans   Orientation: x3 Attention/Concentration: fair - volitionally disengaged at tiems  Memory/Cognition: not formally assessed; recent and remote memory grossly intact   Fund of Knowledge: Average      Musculoskeletal: Strength & Muscle Tone: within normal limits Gait & Station: normal Patient leans: N/A  Physical Exam Constitutional:      Appearance: Normal appearance.  HENT:     Head: Normocephalic and atraumatic.  Eyes:     Extraocular Movements: Extraocular movements intact.  Pulmonary:     Effort: Pulmonary effort is normal.  Abdominal:     General: There is no distension.  Musculoskeletal:        General: Normal range of motion.  Neurological:     General: No focal deficit present.     Mental Status: She is alert.    ROS Blood pressure 124/84, pulse 78, temperature 98 F (36.7 C), temperature source Oral, resp. rate 16, height 5' 3 (1.6 m), weight 65.3 kg, SpO2 99%. Body mass index is 25.51 kg/m.   Treatment Plan Summary: Daily contact with  patient  to assess and evaluate symptoms and progress in treatment The patient is a 48 y.o. female with a psychiatric history most consistent with major depressive disorder, recurrent, severe, without psychotic features and stimulant use disorder, moderate. She has had recurrent low mood episodes from at least her 20s characterized by avolition, anhedonia, severely low mood, poor sleep, poor energy, poor concentration and suicidal thoughts with one suicide attempt (prior to this admission). She does not have any history of mania or psychosis. Reportedly had a history of PNES but no pseudoseizures here or in some years. She previously carried a GAD diagnosis but today voiced situational anxiety only, denied significant impact on life and denied panic attacks. She does likely meet criteria for at least a moderate stimulant use disorder given frequent usage of crack cocaine and some impact on life (worsening of depressive symptoms, relationship issues with family).    Patient initially was non-cooperative and presented as dysphoric, declining medications. As of 9/5, she did begin to take prozac  and was somewhat more engaged in exam, less irritable and with improved eye contact. She began to attend some groups and was expressing future orentation. 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 Nilsa she continued to demonstrate improvements in affect, which was less blunted. She was more engaged in interview, overall less irritable and more open to discussing ongoing concerns and ways to help herself once she leaves the hospital. Continuing to deny SI and was expressing clear future orientation with both short and long-term plans and goals. Today the patient continued to be more forthcoming on interview, more engaged in safety planning and was open about discussing her usage and goals for sobriety. Inpatient rehab has not been helpful and she wants to continue  work, so she was not open to this, however was feeling more motivated to keep herself sober once she leaves the hospital this time. Plan is for discharge home tomorrow   DSM-5 diagnoses: Major Depressive Disorder, recurrent, severe, without psychotic features  2.  Stimulant Use Disorder, moderate      Plan:   Legal Status: -Involuntary - second IVC filled out 02/18/24 at 10:15 am   Safety -q15 minute checks  -elopement, suicide and assault precautions  -daily vitals   Psychiatric Concerns  -Continue Prozac  20 mg daily                   -although she had an OD on this medicine, it is generally non-lethal in overdose and good historical evidence of benefit per chart review and patient    Substance use concerns  -Stimulant use disorder -currently minimizing and non-forthcoming. Will continue to discuss with patient    Nicotine  Replacement  Patch and gum offered    Medical concerns -Recent OD, no significant medical complications, supportive care only and immediate medical clearance -No chronic conditions requiring medicines; will CTM for needs    Additional PRNs: -Tylenol  tablets 650 mg every 6 hours as needed for pain -Maalox/Mylanta suspension 30 mL every 4 hours as needed for indigestion  -Milk of Magnesia 30 mL daily as needed for constipation   Labs -Reviewed as documented in HPI; non concerning    Psychosocial interventions  -Motivational interviewing  -daily medication management with psychiatry -Medication education regarding risks/benefits and alternatives -bedside psychotherapy as indicated  -Patient will be encouraged to participate and engage with group therapy  -Appreciate SW assistance in coordinating safe disposition  Penny LOISE Arts, MD 02/21/2024, 7:52 AM

## 2024-02-21 NOTE — Plan of Care (Signed)
  Problem: Education: Goal: Emotional status will improve Outcome: Progressing   Problem: Activity: Goal: Interest or engagement in activities will improve Outcome: Progressing   Problem: Coping: Goal: Ability to demonstrate self-control will improve Outcome: Progressing   Problem: Health Behavior/Discharge Planning: Goal: Compliance with treatment plan for underlying cause of condition will improve Outcome: Progressing

## 2024-02-21 NOTE — Progress Notes (Signed)
   02/21/24 2024  Psych Admission Type (Psych Patients Only)  Admission Status Involuntary  Psychosocial Assessment  Patient Complaints Depression  Eye Contact Brief  Facial Expression Flat  Affect Appropriate to circumstance  Speech Logical/coherent  Interaction Assertive  Motor Activity Other (Comment) (WDL)  Appearance/Hygiene Unremarkable  Behavior Characteristics Appropriate to situation  Mood Depressed  Thought Process  Coherency WDL  Content WDL  Delusions None reported or observed  Perception WDL  Hallucination None reported or observed  Judgment Poor  Confusion None  Danger to Self  Current suicidal ideation? Denies  Agreement Not to Harm Self Yes  Description of Agreement verbal  Danger to Others  Danger to Others None reported or observed

## 2024-02-21 NOTE — Group Note (Signed)
 Date:  02/21/2024 Time:  10:09 AM  Group Topic/Focus:  Coping Mechanisms This group focused on coping mechanisms and understanding the importance of developing healthier ways to manage stress, emotional distress, and mental health challenges. The group explored healthy vs. nonhealthy coping mechanisms, emphasizing how some behaviors like substance abuse or avoidance can worsen mental health, while others like adequate sleep and food and relaxation techniques can lead to better emotional regulation and overall well-being.  A worksheet was provided to participants and highlighted adaptive vs. maladaptive coping mechanisms. Participants were encouraged to share their personal experiences and engage in conversation focused on resilience, improving self-awareness, and practicing deep breathing as a positive coping mechanism.    Participation Level:  Active  Participation Quality:  Appropriate and Attentive  Affect:  Appropriate  Cognitive:  Alert and Appropriate  Insight: Appropriate and Improving  Engagement in Group:  Engaged and Improving  Modes of Intervention:  Discussion, Education, and Problem-solving  Additional Comments:  Pt attended and participated in this group.  Penny Berger 02/21/2024, 10:09 AM

## 2024-02-21 NOTE — Group Note (Signed)
 Date:  02/21/2024 Time:  5:54 PM  Group Topic/Focus:  The focus of this group is to introduce the topic of wellness and how collage can be used as a creative outlet for expressing emotions, reducing anxiety, while fostering group cohesion and enabling personal insight and healing.    Participation Level:  Minimal  Participation Quality:  Appropriate  Affect:  Flat  Cognitive:  Appropriate  Insight: Improving  Engagement in Group:  Improving  Modes of Intervention:  Activity, Discussion, Exploration, Rapport Building, Socialization, and Support  Additional Comments:    Penny Berger 02/21/2024, 5:54 PM

## 2024-02-21 NOTE — Progress Notes (Addendum)
(  Sleep Hours) - 8.25 hours (Any PRNs that were needed, meds refused, or side effects to meds)-  Trazodone  50 mg, Vistaril  25 mg (Any disturbances and when (visitation, over night)- None (Concerns raised by the patient)-  None (SI/HI/AVH)-  Denies

## 2024-02-21 NOTE — [facility] Group Notes (Signed)
 [facility] Group Notes:  (Nursing/MHT/Case Management/Adjunct)  Date:  02/21/2024  Time:  2000  Type of Therapy:  Wrap up group  Participation Level:  Active  Participation Quality:  Appropriate, Attentive, Sharing, and Supportive  Affect:  Depressed  Cognitive:  Alert  Insight:  Improving  Engagement in Group:  Engaged  Modes of Intervention:  Clarification, Education, and Support  Summary of Progress/Problems: Positive thinking and positive change were discussed.   Lenora Manuelita RAMAN 02/21/2024, 8:44 PM

## 2024-02-21 NOTE — Plan of Care (Signed)
   Problem: Education: Goal: Mental status will improve Outcome: Progressing   Problem: Activity: Goal: Interest or engagement in activities will improve Outcome: Progressing

## 2024-02-21 NOTE — Progress Notes (Signed)
   02/21/24 0730  Psych Admission Type (Psych Patients Only)  Admission Status Involuntary  Psychosocial Assessment  Patient Complaints Depression  Eye Contact Brief  Facial Expression Flat  Affect Appropriate to circumstance  Speech Logical/coherent  Interaction Assertive  Motor Activity Other (Comment) (WNL for pt)  Appearance/Hygiene Unremarkable  Behavior Characteristics Appropriate to situation  Mood Depressed  Thought Process  Coherency WDL  Content WDL  Delusions None reported or observed  Perception WDL  Hallucination None reported or observed  Judgment Poor  Confusion None  Danger to Self  Current suicidal ideation? Denies  Agreement Not to Harm Self Yes  Description of Agreement verbal  Danger to Others  Danger to Others None reported or observed

## 2024-02-21 NOTE — Group Note (Signed)
 [facility] LCSW Group Therapy Note  Date/Time:  @TD @ 10:00-11:00  Type of Therapy and Topic:  Group Therapy:  Personal Bill of Rights  Participation Level:  Active   Description of Group This process group involved a discussion with and between patients about Understanding self.   The difference between healthy and unhealthy coping skills was described, then examples were elicited from group members for their Personal bill of Rights.  This then was followed by a discussion about boundaries, respects and what the patient wants, what it is, how important it is, and why we choose the coping techniques we choose.  Participants were encouraged to think about knowing what they want as necessary and positive.  Therapeutic Goals Patient will identify and describe what their boundary consists of Patient will participate in generating ideas when they use their coping skills to address their boundary  Patients will be supportive of one another and receive support from others Patients will understand the need all humans have to   Summary of Patient Progress:  The patient expressed she does not want to participate but just want to be there   Therapeutic Modalities Brief Solution-Focused Therapy Psychoeducation  Skilynn Durney O Meris Reede, LCSWA 02/21/2024  1:45 PM

## 2024-02-22 DIAGNOSIS — F332 Major depressive disorder, recurrent severe without psychotic features: Principal | ICD-10-CM

## 2024-02-22 MED ORDER — FLUOXETINE HCL 20 MG PO CAPS: 20.0000 mg | ORAL_CAPSULE | Freq: Every day | ORAL | 0 refills | Status: DC

## 2024-02-22 MED ORDER — NICOTINE 14 MG/24HR TD PT24: 14.0000 mg | MEDICATED_PATCH | Freq: Every day | TRANSDERMAL | 0 refills | Status: DC

## 2024-02-22 NOTE — Group Note (Signed)
 Date:  02/22/2024 Time:  10:45 AM  Group Topic/Focus:  Goals Group:   The focus of this group is to help patients establish daily goals to achieve during treatment and discuss how the patient can incorporate goal setting into their daily lives to aide in recovery.    Participation Level:  Active  Participation Quality:  Good  Affect:  Appropriate  Cognitive:  Alert  Insight: Good  Engagement in Group:  Engaged  Modes of Intervention:  Education  Additional Comments:    Penny Berger 02/22/2024, 10:45 AM

## 2024-02-22 NOTE — Group Note (Unsigned)
 Date:  02/22/2024 Time:  8:23 AM  Group Topic/Focus:  Goals Group:   The focus of this group is to help patients establish daily goals to achieve during treatment and discuss how the patient can incorporate goal setting into their daily lives to aide in recovery.     Participation Level:  {[facility] PARTICIPATION OZCZO:77735}  Participation Quality:  {[facility] PARTICIPATION QUALITY:22265}  Affect:  {[facility] AFFECT:22266}  Cognitive:  {[facility] COGNITIVE:22267}  Insight: {[facility] Insight2:20797}  Engagement in Group:  {[facility] ENGAGEMENT IN HMNLE:77731}  Modes of Intervention:  {[facility] MODES OF INTERVENTION:22269}  Additional Comments:  ***  Penny Berger 02/22/2024, 8:23 AM

## 2024-02-22 NOTE — Progress Notes (Signed)
   02/22/24 0800  Psych Admission Type (Psych Patients Only)  Admission Status Voluntary  Psychosocial Assessment  Patient Complaints Depression  Eye Contact Brief  Facial Expression Flat  Affect Appropriate to circumstance  Speech Logical/coherent  Interaction Assertive  Motor Activity Slow  Appearance/Hygiene Unremarkable  Behavior Characteristics Appropriate to situation  Mood Depressed  Thought Process  Coherency WDL  Content WDL  Delusions None reported or observed  Perception WDL  Hallucination None reported or observed  Judgment Poor  Confusion None  Danger to Self  Current suicidal ideation? Denies  Description of Suicide Plan No Plan  Agreement Not to Harm Self Yes  Description of Agreement Verbal  Danger to Others  Danger to Others None reported or observed

## 2024-02-22 NOTE — Progress Notes (Signed)
 Pt discharged to lobby where her mother is providing transportation home. Pt was stable and appreciative at that time. All papers and prescriptions were given and valuables returned. Verbal understanding expressed. Denies SI/HI and A/VH. Pt given opportunity to express concerns and ask questions.

## 2024-02-22 NOTE — BHH Suicide Risk Assessment (Signed)
 [facility] Discharge Suicide Risk Assessment   Principal Problem: MDD (major depressive disorder), severe (HCC) Discharge Diagnoses: Principal Problem:   MDD (major depressive disorder), severe (HCC)   Total Time spent with patient: 63  Suicide Risk: The patient presented with acute risk factors for suicide including active depressive symptoms, suicidal gesture (swallowed 4 pills of 20 mg prozac  prior to transfer here, reporting stopping herself before swallowing a lethal dose and calling for help). On admission she presented with dysphoric affect, hopelessness, and was declining all help including medication management, therapy or any other interventions. She carries additional chronic risk factors of prior hospitalizations, prior endorsed suicidal thoughts, poor medication adherence outside the hospital, substance use concerns (cocaine). Acute risk factors were address by inpatient admission on an IVC. She was offered medication, which she began taking on second day of hospitalization, and throughout admission became less guarded and more open about ongoing depressive experiences. She began attending and participating in groups. By day of discharge she was presenting with neutral affect, denying suicidal ideation throughout the entirety of admission, voicing future orientation (worries about work, etc.) and was voicing intent to quit substances. Additional protective factors include self-aborting SA and calling for help prior to this admission, employment, support of family, and stable housing. Although she will be chronically at elevated risk in comparison to the general population, current and short term risk of suicide is considered low.   The most appropriate and least restrictive environment is the outpatient setting. Main mitigating factors will be ongoing medication adherence and sobriety. Inpatient options for rehab were offered and declined. Further mental health care is best managed on an  outpatient basis as documented below.     Follow-up Information     [facility] Follow up on 02/29/2024.   Why: You have a hospital follow up appointment for therapy and medication management services on  02/29/24 at 10:30 am.  The appointment will be Virtual, telehealth.  * Please call to cancel if you cannot attend this appt. Contact information: [address]  [address] [place] [address] [phone]                  Leita LOISE Arts, MD 02/22/2024, 7:19 AM

## 2024-02-22 NOTE — Plan of Care (Signed)
   Problem: Education: Goal: Verbalization of understanding the information provided will improve Outcome: Progressing   Problem: Activity: Goal: Interest or engagement in activities will improve Outcome: Progressing

## 2024-02-22 NOTE — Progress Notes (Signed)
  [facility] Adult Case Management Discharge Plan :  Will you be returning to the same living situation after discharge:  Yes,  patient will be returning to address on file.  At discharge, do you have transportation home?: Yes,  patient will be receiving transportation from her mother, Garrison Muscat, at 17 AM.  Do you have the ability to pay for your medications: Yes,  patient has active health insurance.  Release of information consent forms completed and in the chart;  Patient's signature needed at discharge.  Patient to Follow up at:  Follow-up Information     [facility] Follow up on 02/29/2024.   Why: You have a hospital follow up appointment for therapy and medication management services on  02/29/24 at 10:30 am.  The appointment will be Virtual, telehealth.  * Please call to cancel if you cannot attend this appt. Contact information: [address]  [address] [place] [address] [phone]                 Next level of care provider has access to [facility] Link:no  Safety Planning and Suicide Prevention discussed: No. Patient refused consents. Suicide Prevention Education was reviewed thoroughly with patient, including risk factors, warning signs, and what to do. Mobile Crisis services were described and that telephone number pointed out, with encouragement to patient to put this number in personal cell phone. Brochure was provided to patient to share with natural supports. Patient acknowledged the ways in which they are at risk, and how working through each of their issues can gradually start to reduce their risk factors. Patient was encouraged to think of the information in the context of people in their own lives. Patient denied having access to firearms Patient verbalized understanding of information provided. Patient endorsed a desire to live.       Has patient been referred to the Quitline?: Patient refused referral for treatment  Patient has been referred for addiction  treatment: Patient refused referral for treatment.  Louetta Lame, LCSWA 02/22/2024, 9:39 AM

## 2024-02-22 NOTE — Discharge Summary (Signed)
 Physician Discharge Summary Note  Patient:  Penny Berger is an 48 y.o., female MRN:  [id number] DOB:  April 14, 1976 Patient phone:  [phone] (home)  Patient address:   [address] [place] [address],  Total Time spent with patient: 35 minutes  Date of Admission:  02/17/2024 Date of Discharge: 02/22/24  Reason for Admission:  SI, suicidal gesture   Principal Problem: MDD (major depressive disorder), severe (HCC) Discharge Diagnoses: Principal Problem:   MDD (major depressive disorder), severe (HCC)  Identifying Information and Past Psychiatric History:  The patient is a 48 y.o. 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     Psychiatric history is notable for recurrent major depressive episodes with 4 lifetime hospitalizations for symptoms including anergia, anhedonia, amotivation, increased tearfulness, decreased energy and concentration and SI. Prior to this hospitalization she had never had a suicide attempt, and OD on this hospitalization was an attempt vs gesture given low amount ingested (4 tablets of 20 mg prozac ). No history of mania or psychosis. Historical diagnoses of GAD and panic, although patient has since reported experiencing only mild situational anxiety not impairing to life. She does reportedly have a history of PNES, although has not had pseudoseizures in a number of years. Depression has been largely refractory to treatment with numerous medicines tried as documented below. However this is confounded by poor outpatient adherence with reports of stopping all medications after hospitalization. Finally, the patient has had frequent usage of crack cocaine which has likely worsened depression to some degree.    Previous psychotropic medications  include: Venlafaxine , fluoxetine , sertraline, bupropion, alprazolam, hydroxyzine , lithium , topiramate,  aripiprazole , and trazodone .     Hospital Course: The patient presented to [facility] ED on 02/16/24 BIB sister after an intentional overdose on 4 tablets of 20 mg prozac  (approx. 2:30 pm on 9/2). She later reported this as the beginning of a suicide attempt, however self-aborted before taking what she would consider a lethal dose and called sister for help, who encouraged her to come to the ED. On arrival there was concern for active SI and she was transferred to [facility] on IVC overnight 9/3-9/4 after medical clearance. Mirtazapine  was ordered by admitting physician for that evening, however patient refused this medication overnight.  On initial evaluation at [facility] she was observably dysphoric in affect, presented as irritable and hopeless, denying belief that she will ever get better. She endorsed active symptoms consistent with a major depressive episode. Regarding medication options, although patient did take 4 pills of prozac  as a suicidal gesture prior to this admission, there was evidence per chart review that this medication was the most effective for her in the past. Furthermore, it was noted that mirtazapine  is more lethal in overdose than prozac . Given this, Prozac  was restarted for her at 20 mg daily. EKG reviewed with no concerning findings.   The patient was initially uncooperative with all elements of care, refusing medications and groups as she felt nothing inpatient would be helpful for her. However as of 9/5, she did begin to take prozac  and was somewhat more engaged in exam, less irritable and with improved eye contact. She began to attend some groups and was expressing future orentation. 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 As  of 9/6 she continued to demonstrate improvements in affect, which was less blunted. She was more  engaged in interview, overall less irritable and more open to discussing ongoing concerns and ways to help herself once she leaves the hospital. Continuing to deny SI and was expressing clear future orientation with both short and long-term plans and goals. Yesterday, the patient continued to be more forthcoming on interview, more engaged in safety planning and was open about discussing her usage and goals for sobriety. Inpatient rehab has not been helpful and she wants to continue work, so she was not open to this, however was feeling more motivated to keep herself sober once she leaves the hospital this time. Plan was for discharge home tomorrow  Interview today: Today the patient reports she is pretty good. She is looking forward to getting out of the hospital and plans to return to work on Thursday to give her a little more time to recover. She notes that continuing on medications outside the hospital has been hard for her, but she is really going to try this time as she needs to make some changes. Also agrees that cocaine has been making her mood worse and will do her best to quit. Programming has not been helpful and she remains uninterested in this. She believes therapy to manage feelings of depression will be most helpful in the long run - having someone to talk to about what weighs on her mind. She believes this will improve mood and make it easier to stay away from substances. No SI, HI or AVH. No other concerns today. Voice good sleep, good appetite, and improvements in mood.    Behavior on unit: Patient initially presented with hoplessness and concern for active thoughts of life not being worth living, although consistently denied frank SI throughout the entirety of admission. She was initially uncooperative with care, but by day two was agreeable to taking medications and began interacting more with peers and attending groups. She became more open about discussing her substance usage. At no  time did she present with signs or symptoms of mania or psychosis. She did not require any PRN medications for agitation and did not have any behavioral concerns throughout admission. By day of discharge she was more engaged and cooperative, demonstrating better future orientation, engaged in safety planning and showing improvements in mood, sleep, energy and affect.   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 Follow up scheduled as documented below. Advised to call [phone] or [phone] if in crisis or re-present to the ED. Recommended sobriety and patient voiced intent to quit cocaine but no interest in inpatient or outpatient programming.   Past Medical History:  Past Medical History:  Diagnosis Date   Seizures (HCC)    Syncope     Past Surgical History:  Procedure Laterality Date   APPENDECTOMY     CESAREAN SECTION     Family History: History reviewed. No pertinent family history.  Social History:  Social History   Substance and Sexual Activity  Alcohol Use Not Currently     Social History   Substance and Sexual Activity  Drug Use Yes   Types: Cocaine    Social History   Socioeconomic History   Marital status: Single    Spouse name: Not on file   Number of children: Not on file   Years of education: Not on file   Highest  education level: Not on file  Occupational History   Not on file  Tobacco Use   Smoking status: Every Day    Current packs/day: 0.50    Types: Cigarettes   Smokeless tobacco: Never  Vaping Use   Vaping status: Never Used  Substance and Sexual Activity   Alcohol use: Not Currently   Drug use: Yes    Types: Cocaine   Sexual activity: Yes  Other Topics Concern   Not on file  Social History Narrative   Not on file   Social Drivers of Health   Financial Resource Strain: Not on file  Food Insecurity: No Food Insecurity (02/17/2024)   Hunger Vital Sign    Worried  About Running Out of Food in the Last Year: Never true    Ran Out of Food in the Last Year: Never true  Transportation Needs: No Transportation Needs (02/17/2024)   PRAPARE - Administrator, Civil Service (Medical): No    Lack of Transportation (Non-Medical): No  Physical Activity: Not on file  Stress: Not on file  Social Connections: Not on file   Physical findings:  Discharge Mental Status exam: Appearance: black female of average BMI, tight cap on head, seen interacting with peers in the day room but ambulates to room for interview  Eye contact: good  Attitude towards examiner: cooperative, was not guarded today  Psychomotor: no agitation or retardation Speech: today remained largely normal in rate and rhythm Language: no delays  Mood: pretty good  Affect: congruent, neutral, increased reactivity, overall improved  Thought content: Denying SI today, no HI, no delusions expressed  Thought Process: linear and organized  Perception: denying AVH, not RTIS  Insight: fair to good - improving   Judgement: fair - improved; now discussing more reasonable outpatient plans   Orientation: x3 Attention/Concentration: fair - volitionally disengaged at tiems  Memory/Cognition: not formally assessed; recent and remote memory grossly intact   Fund of Knowledge: Average      Musculoskeletal: Strength & Muscle Tone: within normal limits Gait & Station: normal Patient leans: N/A  Physical Exam Constitutional:      Appearance: Normal appearance.  HENT:     Head: Normocephalic and atraumatic.  Pulmonary:     Effort: Pulmonary effort is normal.  Abdominal:     Tenderness: There is no abdominal tenderness.  Musculoskeletal:        General: Normal range of motion.  Neurological:     General: No focal deficit present.     Mental Status: She is alert.    ROS Blood pressure 128/78, pulse 75, temperature 98.3 F (36.8 C), temperature source Oral, resp. rate 16, height 5' 3  (1.6 m), weight 65.3 kg, SpO2 99%. Body mass index is 25.51 kg/m.   Social History   Tobacco Use  Smoking Status Every Day   Current packs/day: 0.50   Types: Cigarettes  Smokeless Tobacco Never   Tobacco Cessation:  A prescription for an FDA-approved tobacco cessation medication provided at discharge   Blood Alcohol level:  Lab Results  Component Value Date   [facility] <15 02/16/2024   ETH <10 11/17/2022    Metabolic Disorder Labs:  Lab Results  Component Value Date   HGBA1C 6.1 (H) 11/17/2022   MPG 128 11/17/2022   MPG 116.89 05/02/2022   Lab Results  Component Value Date   PROLACTIN 13.7 11/17/2022   PROLACTIN 5.0 05/02/2022   Lab Results  Component Value Date   CHOL 229 (H) 11/17/2022  TRIG 113 11/17/2022   HDL 37 (L) 11/17/2022   CHOLHDL 6.2 11/17/2022   VLDL 23 11/17/2022   LDLCALC 169 (H) 11/17/2022   LDLCALC 154 (H) 05/02/2022    Discharge destination:  home with mom    Allergies as of 02/22/2024   No Known Allergies      Medication List     STOP taking these medications    cyclobenzaprine 10 MG tablet Commonly known as: FLEXERIL   hydrOXYzine  50 MG tablet Commonly known as: ATARAX    propranolol  10 MG tablet Commonly known as: INDERAL    traZODone  50 MG tablet Commonly known as: DESYREL        TAKE these medications      Indication  FLUoxetine  20 MG capsule Commonly known as: PROZAC  Take 1 capsule (20 mg total) by mouth daily.  Indication: Depression   medroxyPROGESTERone 150 MG/ML injection Commonly known as: DEPO-PROVERA Inject 150 mg into the muscle every 3 (three) months.  Indication: Birth Control Treatment   nicotine  14 mg/24hr patch Commonly known as: NICODERM CQ  - dosed in mg/24 hours Place 1 patch (14 mg total) onto the skin daily.  Indication: Nicotine  Addiction        Follow-up Information     [facility] Follow up on 02/29/2024.   Why: You have a hospital follow up appointment for therapy and medication management  services on  02/29/24 at 10:30 am.  The appointment will be Virtual, telehealth.  * Please call to cancel if you cannot attend this appt. Contact information: [address]  [address] [place] [address] [phone]                  Signed: Leita LOISE Arts, MD 02/22/2024, 9:55 AM

## 2024-02-22 NOTE — Progress Notes (Signed)
(  Sleep Hours) - 7.75 hours (Any PRNs that were needed, meds refused, or side effects to meds)-  Trazodone  50 mg, Vistaril  25 mg (Any disturbances and when (visitation, over night)- None (Concerns raised by the patient)-  None (SI/HI/AVH)-  denies

## 2024-03-15 ENCOUNTER — Ambulatory Visit (HOSPITAL_COMMUNITY)
Admission: EM | Admit: 2024-03-15 | Discharge: 2024-03-15 | Disposition: A | Discharge status: Other Acute Inpatient Hospital | Attending: Family Medicine | Admitting: Family Medicine

## 2024-03-15 ENCOUNTER — Other Ambulatory Visit (HOSPITAL_COMMUNITY)
Admission: EM | Admit: 2024-03-15 | Discharge: 2024-03-19 | Disposition: A | Discharge status: Home / Self Care | Source: Intra-hospital | Attending: Family Medicine | Admitting: Family Medicine

## 2024-03-15 DIAGNOSIS — F1424 Cocaine dependence with cocaine-induced mood disorder: Secondary | ICD-10-CM | POA: Insufficient documentation

## 2024-03-15 DIAGNOSIS — F331 Major depressive disorder, recurrent, moderate: Secondary | ICD-10-CM | POA: Diagnosis not present

## 2024-03-15 DIAGNOSIS — Z79899 Other long term (current) drug therapy: Secondary | ICD-10-CM | POA: Insufficient documentation

## 2024-03-15 DIAGNOSIS — F1410 Cocaine abuse, uncomplicated: Secondary | ICD-10-CM | POA: Insufficient documentation

## 2024-03-15 DIAGNOSIS — Z91148 Patient's other noncompliance with medication regimen for other reason: Secondary | ICD-10-CM | POA: Insufficient documentation

## 2024-03-15 DIAGNOSIS — R45851 Suicidal ideations: Secondary | ICD-10-CM | POA: Diagnosis not present

## 2024-03-15 DIAGNOSIS — F1414 Cocaine abuse with cocaine-induced mood disorder: Secondary | ICD-10-CM

## 2024-03-15 DIAGNOSIS — F349 Persistent mood [affective] disorder, unspecified: Secondary | ICD-10-CM

## 2024-03-15 LAB — POCT URINE DRUG SCREEN - MANUAL ENTRY (I-SCREEN)
POC Amphetamine UR: NOT DETECTED
POC Buprenorphine (BUP): NOT DETECTED
POC Cocaine UR: POSITIVE — AB
POC Marijuana UR: POSITIVE — AB
POC Methadone UR: NOT DETECTED
POC Methamphetamine UR: NOT DETECTED
POC Morphine: NOT DETECTED
POC Oxazepam (BZO): NOT DETECTED
POC Oxycodone UR: NOT DETECTED
POC Secobarbital (BAR): NOT DETECTED

## 2024-03-15 LAB — COMPREHENSIVE METABOLIC PANEL WITH GFR
ALT: 10 U/L (ref 0–44)
AST: 13 U/L — ABNORMAL LOW (ref 15–41)
Albumin: 3.7 g/dL (ref 3.5–5.0)
Alkaline Phosphatase: 54 U/L (ref 38–126)
Anion gap: 8 (ref 5–15)
BUN: 8 mg/dL (ref 6–20)
CO2: 23 mmol/L (ref 22–32)
Calcium: 9 mg/dL (ref 8.9–10.3)
Chloride: 108 mmol/L (ref 98–111)
Creatinine, Ser: 0.64 mg/dL (ref 0.44–1.00)
GFR, Estimated: >60 mL/min (ref >60)
Glucose, Bld: 79 mg/dL (ref 70–99)
Potassium: 4.1 mmol/L (ref 3.5–5.1)
Sodium: 139 mmol/L (ref 135–145)
Total Bilirubin: 0.9 mg/dL (ref 0.0–1.2)
Total Protein: 7.3 g/dL (ref 6.5–8.1)

## 2024-03-15 LAB — CBC WITH DIFFERENTIAL/PLATELET
Abs Immature Granulocytes: 0.02 K/uL (ref 0.00–0.07)
Basophils Absolute: 0.1 K/uL (ref 0.0–0.1)
Basophils Relative: 1 %
Eosinophils Absolute: 0.1 K/uL (ref 0.0–0.5)
Eosinophils Relative: 1 %
HCT: 42 % (ref 36.0–46.0)
Hemoglobin: 13.7 g/dL (ref 12.0–15.0)
Immature Granulocytes: 0 %
Lymphocytes Relative: 29 %
Lymphs Abs: 1.9 K/uL (ref 0.7–4.0)
MCH: 30.2 pg (ref 26.0–34.0)
MCHC: 32.6 g/dL (ref 30.0–36.0)
MCV: 92.5 fL (ref 80.0–100.0)
Monocytes Absolute: 0.7 K/uL (ref 0.1–1.0)
Monocytes Relative: 11 %
Neutro Abs: 3.9 K/uL (ref 1.7–7.7)
Neutrophils Relative %: 58 %
Platelets: 433 K/uL — ABNORMAL HIGH (ref 150–400)
RBC: 4.54 MIL/uL (ref 3.87–5.11)
RDW: 15 % (ref 11.5–15.5)
WBC: 6.6 K/uL (ref 4.0–10.5)
nRBC: 0 % (ref 0.0–0.2)

## 2024-03-15 LAB — POCT PREGNANCY, URINE: Preg Test, Ur: NEGATIVE

## 2024-03-15 LAB — POC URINE PREG, ED: Preg Test, Ur: NEGATIVE

## 2024-03-15 LAB — ETHANOL: Alcohol, Ethyl (B): <15 mg/dL (ref <15)

## 2024-03-15 MED ORDER — MAGNESIUM HYDROXIDE 400 MG/5ML PO SUSP: 30.0000 mL | Freq: Every day | ORAL | Status: DC | PRN

## 2024-03-15 MED ORDER — ALUM & MAG HYDROXIDE-SIMETH 200-200-20 MG/5ML PO SUSP: 30.0000 mL | ORAL | Status: DC | PRN

## 2024-03-15 MED ORDER — HALOPERIDOL LACTATE 5 MG/ML IJ SOLN: 5.0000 mg | Freq: Three times a day (TID) | INTRAMUSCULAR | Status: DC | PRN

## 2024-03-15 MED ORDER — DIPHENHYDRAMINE HCL 50 MG/ML IJ SOLN: 50.0000 mg | Freq: Three times a day (TID) | INTRAMUSCULAR | Status: DC | PRN

## 2024-03-15 MED ORDER — ACETAMINOPHEN 325 MG PO TABS: 650.0000 mg | ORAL_TABLET | Freq: Four times a day (QID) | ORAL | Status: DC | PRN

## 2024-03-15 MED ORDER — HALOPERIDOL 5 MG PO TABS: 5.0000 mg | ORAL_TABLET | Freq: Three times a day (TID) | ORAL | Status: DC | PRN

## 2024-03-15 MED ORDER — HALOPERIDOL LACTATE 5 MG/ML IJ SOLN: 10.0000 mg | Freq: Three times a day (TID) | INTRAMUSCULAR | Status: DC | PRN

## 2024-03-15 MED ORDER — LORAZEPAM 2 MG/ML IJ SOLN: 2.0000 mg | Freq: Three times a day (TID) | INTRAMUSCULAR | Status: DC | PRN

## 2024-03-15 MED ORDER — DIPHENHYDRAMINE HCL 50 MG PO CAPS: 50.0000 mg | ORAL_CAPSULE | Freq: Three times a day (TID) | ORAL | Status: DC | PRN

## 2024-03-15 NOTE — Group Note (Signed)
 Group Topic: Social Support  Group Date: 03/15/2024 Start Time: 1200 End Time: 1230 Facilitators: Hendrik Donath, Zane HERO, RN; Herold Lajuana NOVAK, RN  Department: [facility]  Number of Participants: 5  Group Focus: check in and social skills Treatment Modality:  Individual Therapy Interventions utilized were support Purpose: express feelings and increase insight  Name: Penny Berger Date of Birth: 06/18/1975  MR: [id number]    Level of Participation: did not attend Quality of Participation:  Interactions with others:  Mood/Affect:  Triggers (if applicable):  Cognition:  Progress: None Response:  Plan: patient will be encouraged to attend future groups/programming on the unit  Patients Problems:  Patient Active Problem List   Diagnosis Date Noted   MDD (major depressive disorder), recurrent episode, moderate (HCC) 03/15/2024   Intentional overdose (HCC) 02/17/2024   MDD (major depressive disorder), severe (HCC) 02/17/2024   Suicidal thoughts 11/17/2022   Severe episode of recurrent major depressive disorder, without psychotic features (HCC) 05/03/2022   Suicidal behavior with attempted self-injury (HCC) 05/03/2022   Cocaine use disorder (HCC) 05/03/2022   Alcohol use disorder, moderate, dependence (HCC) 05/03/2022   Marijuana smoker, continuous 05/03/2022

## 2024-03-15 NOTE — Group Note (Signed)
 Group Topic: Decisional Balance/Substance Abuse  Group Date: 03/15/2024 Start Time: 1300 End Time: 1345 Facilitators: Alyse Leilani LABOR, NT  Department: [facility]  Number of Participants: 8  Group Focus: activities of daily living skills, affirmation, and clarity of thought Treatment Modality:  Skills Training Interventions utilized were group exercise and mental fitness Purpose: enhance coping skills, express feelings, and reinforce self-care  Name: Penny Berger Date of Birth: 1975/07/16  MR: [id number]   Pt was not on the unit at the time of group Patients Problems:  Patient Active Problem List   Diagnosis Date Noted   MDD (major depressive disorder), recurrent episode, moderate (HCC) 03/15/2024   Intentional overdose (HCC) 02/17/2024   MDD (major depressive disorder), severe (HCC) 02/17/2024   Suicidal thoughts 11/17/2022   Severe episode of recurrent major depressive disorder, without psychotic features (HCC) 05/03/2022   Suicidal behavior with attempted self-injury (HCC) 05/03/2022   Cocaine use disorder (HCC) 05/03/2022   Alcohol use disorder, moderate, dependence (HCC) 05/03/2022   Marijuana smoker, continuous 05/03/2022

## 2024-03-15 NOTE — ED Notes (Signed)
 Pt transferred from observation unit to [facility] requesting detox from crack/cocaine and endorsing worthlessness. Pt denies SI/HI/AVH but states, I don't want to kill myself but I wish I was dead. I wouldn't do anything to myself. That's a sin. I just wish I was dead. Crack is the only thing that makes me happy. Without it, I'm useless. Calm, cooperative but depressed throughout interview process. Skin assessment completed. Oriented to unit. Meal and drink offered. At currrent, pt eating meal in dayroom. No interaction noted with peers. Pt verbally contract for safety. Will monitor for safety.

## 2024-03-15 NOTE — ED Notes (Signed)
 Labs sent to [facility]: Two and half lavender, one dark green, one light green, one yellow, one red, along with patient's labels on tubes, and patient's Order Requisition.

## 2024-03-15 NOTE — Discharge Instructions (Signed)
 Admit to Facility Based Crisis
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
## 2024-03-15 NOTE — Group Note (Signed)
 Group Topic: Healthy Self Image and Positive Change  Group Date: 03/15/2024 Start Time: 2000 End Time: 2030 Facilitators: Anice Benton LABOR, NT  Department: [facility]  Number of Participants: 5  Group Focus: goals/reality orientation Treatment Modality:  Individual Therapy Interventions utilized were assignment Purpose: express feelings and healthy future goals   Name: Penny Berger Date of Birth: 29-Aug-1975  MR: [id number]    Level of Participation: Did Not Attend  Quality of Participation: N/A Interactions with others: N/A Mood/Affect: N/A Triggers (if applicable): N/A Cognition: N/A Progress: N/A Response: N/A Plan: patient will be encouraged to attend groups  Patients Problems:  Patient Active Problem List   Diagnosis Date Noted   MDD (major depressive disorder), recurrent episode, moderate (HCC) 03/15/2024   Intentional overdose (HCC) 02/17/2024   MDD (major depressive disorder), severe (HCC) 02/17/2024   Suicidal thoughts 11/17/2022   Severe episode of recurrent major depressive disorder, without psychotic features (HCC) 05/03/2022   Suicidal behavior with attempted self-injury (HCC) 05/03/2022   Cocaine use disorder (HCC) 05/03/2022   Alcohol use disorder, moderate, dependence (HCC) 05/03/2022   Marijuana smoker, continuous 05/03/2022
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
## 2024-03-15 NOTE — ED Notes (Signed)
 Patient admitted to observation unit voluntarily with complaint of depression, anxiety, SI and addiction problems.Patient reports using crack and hasn't been taking her Prozac  as ordered. Endorsing SI no plan to provider. Denies SI/HI/AVH to this RN. History of 1 suicide attempt once this month. Acclimated to unit. Will continue to monitor throughout the shift.

## 2024-03-15 NOTE — ED Notes (Signed)
 Pt sitting in dayroom watching television. No acute distress noted. No concerns voiced. Informed pt to notify staff with any needs or assistance. Pt verbalized understanding and agreement. Will continue to monitor for safety.

## 2024-03-15 NOTE — ED Notes (Signed)
 Pt is sleeping at this moment. No acute distress noted. Respiration are even and labored. Q15 safety checks are in place.

## 2024-03-15 NOTE — ED Notes (Signed)
 Pt was observed and assessed  in her room. Pt was sleeping and was not ready to talk much. She denied SI/HI/AVH. Q15 safety checks per facility routine is in place.
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
## 2024-03-16 DIAGNOSIS — F1410 Cocaine abuse, uncomplicated: Secondary | ICD-10-CM | POA: Diagnosis not present

## 2024-03-16 DIAGNOSIS — F331 Major depressive disorder, recurrent, moderate: Secondary | ICD-10-CM | POA: Diagnosis not present

## 2024-03-16 DIAGNOSIS — R45851 Suicidal ideations: Secondary | ICD-10-CM | POA: Diagnosis not present

## 2024-03-16 NOTE — Group Note (Signed)
 Group Topic: Relapse and Recovery  Group Date: 03/16/2024 Start Time: 2000 End Time: 2100 Facilitators: Joan Plowman B  Department: [facility]  Number of Participants: 10  Group Focus: abuse issues, chemical dependency education, chemical dependency issues, coping skills, daily focus, and dual diagnosis Treatment Modality:  Leisure Counsellor, Patient-Centered Therapy, Psychoeducation, and Spiritual Interventions utilized were leisure development, patient education, and support Purpose: enhance coping skills, increase insight, relapse prevention strategies, and trigger / craving management  Name: Penny Berger Date of Birth: [birth year]  MR: [id number]    Level of Participation: active Quality of Participation: cooperative Interactions with others: gave feedback Mood/Affect: appropriate Triggers (if applicable): NA Cognition: coherent/clear Progress: Gaining insight Response: NA Plan: patient will be encouraged to keep going to groups  Patients Problems:  Patient Active Problem List   Diagnosis Date Noted   MDD (major depressive disorder), recurrent episode, moderate (HCC) 03/15/2024   Intentional overdose (HCC) 02/17/2024   MDD (major depressive disorder), severe (HCC) 02/17/2024   Suicidal thoughts 11/17/2022   Severe episode of recurrent major depressive disorder, without psychotic features (HCC) 05/03/2022   Suicidal behavior with attempted self-injury (HCC) 05/03/2022   Cocaine use disorder (HCC) 05/03/2022   Alcohol use disorder, moderate, dependence (HCC) 05/03/2022   Marijuana smoker, continuous 05/03/2022

## 2024-03-16 NOTE — Group Note (Signed)
 Group Topic: Healthy Self Image and Positive Change  Group Date: 03/16/2024 Start Time: 1430 End Time: 1530 Facilitators: Liston Cooper PARAS, NT; Linnea Rob, NT  Department: [facility]  Number of Participants: 8  Group Focus: community group Treatment Modality:  Psychoeducation Interventions utilized were patient education Purpose: regain self-worth  Name: Penny Berger Date of Birth: 13-Sep-1975  MR: [id number]    Level of Participation: did not attend Quality of Participation:  Interactions with others:  Mood/Affect:  Triggers (if applicable):  Cognition:  Progress:  Response:  Plan:   Patients Problems:  Patient Active Problem List   Diagnosis Date Noted   MDD (major depressive disorder), recurrent episode, moderate (HCC) 03/15/2024   Intentional overdose (HCC) 02/17/2024   MDD (major depressive disorder), severe (HCC) 02/17/2024   Suicidal thoughts 11/17/2022   Severe episode of recurrent major depressive disorder, without psychotic features (HCC) 05/03/2022   Suicidal behavior with attempted self-injury (HCC) 05/03/2022   Cocaine use disorder (HCC) 05/03/2022   Alcohol use disorder, moderate, dependence (HCC) 05/03/2022   Marijuana smoker, continuous 05/03/2022

## 2024-03-16 NOTE — ED Notes (Signed)
 Pt is sleeping currently. No acute distress noted.

## 2024-03-16 NOTE — ED Notes (Addendum)
 Patient A&Ox4. Denies intent to harm self/others when asked. Denies A/VH. Patient denies any physical complaints when asked. When writer asked pt how she was feeling this am, pt replied, I'm still living so I can't be doing that good. I don't want to kill myself, I just don't want to be here, and walked away. Pt didn't elaborate any further. Pt proceeded to cafeteria to eat bkft. Denies withdrawal sx from cocaine. Support and encouragement provided. Routine safety checks conducted according to facility protocol. Encouraged patient to notify staff if thoughts of harm toward self or others arise. Patient verbalize understanding and agreement. Will continue to monitor for safety.

## 2024-03-16 NOTE — Group Note (Signed)
 Group Topic: Communication  Group Date: 03/16/2024 Start Time: 1000 End Time: 1030 Facilitators: Herold Lajuana NOVAK, RN  Department: [facility]  Number of Participants: 6  Group Focus: goals/reality orientation Treatment Modality:  Individual Therapy Interventions utilized were exploration Purpose: relapse prevention strategies  Name: Penny Berger Date of Birth: [birth year]  MR: [id number]    Level of Participation: when cued Quality of Participation: immature Interactions with others: gave feedback Mood/Affect: flat Triggers (if applicable): none identied Cognition: no insight Progress: Minimal Response: the fact that I woke up today makes this a bad day Plan: patient will be encouraged to recognize self worth and look for more positive solutions to her problems  Patients Problems:  Patient Active Problem List   Diagnosis Date Noted   MDD (major depressive disorder), recurrent episode, moderate (HCC) 03/15/2024   Intentional overdose (HCC) 02/17/2024   MDD (major depressive disorder), severe (HCC) 02/17/2024   Suicidal thoughts 11/17/2022   Severe episode of recurrent major depressive disorder, without psychotic features (HCC) 05/03/2022   Suicidal behavior with attempted self-injury (HCC) 05/03/2022   Cocaine use disorder (HCC) 05/03/2022   Alcohol use disorder, moderate, dependence (HCC) 05/03/2022   Marijuana smoker, continuous 05/03/2022

## 2024-03-16 NOTE — ED Notes (Signed)
 Pt sitting in dayroom watching television and eating lunch. No acute distress noted. No concerns voiced. Informed pt to notify staff with any needs or assistance. Pt verbalized understanding and agreement. Will continue to monitor for safety.

## 2024-03-17 DIAGNOSIS — F331 Major depressive disorder, recurrent, moderate: Secondary | ICD-10-CM | POA: Diagnosis not present

## 2024-03-17 DIAGNOSIS — R45851 Suicidal ideations: Secondary | ICD-10-CM | POA: Diagnosis not present

## 2024-03-17 DIAGNOSIS — F1410 Cocaine abuse, uncomplicated: Secondary | ICD-10-CM | POA: Diagnosis not present

## 2024-03-17 MED ORDER — FLUOXETINE HCL 10 MG PO CAPS
10.0000 mg | ORAL_CAPSULE | Freq: Every day | ORAL | Status: DC
  Administered 2024-03-17 – 2024-03-18 (×2): 10 mg via ORAL
  Filled 2024-03-17 (×2): qty 1

## 2024-03-17 NOTE — ED Notes (Signed)
 Pt ate lunch, just returned to room. Pt denies complaints at this time. Pt continues to remain cooperative, polite, and present as depressed.

## 2024-03-17 NOTE — Group Note (Signed)
 Group Topic: Wellness  Group Date: 03/17/2024 Start Time: 1100 End Time: 1130 Facilitators: Daved Tinnie HERO, RN  Department: [facility]  Number of Participants: 6  Group Focus: psychiatric education Treatment Modality:  Psychoeducation Interventions utilized were patient education Purpose: increase insight  Name: Penny Berger Date of Birth: April 15, 1976  MR: [id number]    Level of Participation: active Quality of Participation: cooperative Interactions with others: gave feedback Mood/Affect: appropriate Triggers (if applicable): n/a Cognition: coherent/clear Progress: Gaining insight Response: RN reviewed medications, pt expressed understanding. Further questions denied Plan: patient will be encouraged to attend future RN education groups  Patients Problems:  Patient Active Problem List   Diagnosis Date Noted   MDD (major depressive disorder), recurrent episode, moderate (HCC) 03/15/2024   Intentional overdose (HCC) 02/17/2024   MDD (major depressive disorder), severe (HCC) 02/17/2024   Suicidal thoughts 11/17/2022   Severe episode of recurrent major depressive disorder, without psychotic features (HCC) 05/03/2022   Suicidal behavior with attempted self-injury (HCC) 05/03/2022   Cocaine use disorder (HCC) 05/03/2022   Alcohol use disorder, moderate, dependence (HCC) 05/03/2022   Marijuana smoker, continuous 05/03/2022

## 2024-03-17 NOTE — ED Notes (Addendum)
 Pt presents as depressed, grimace on face, says she feels 'sad'.  Pt would not contract for safety, saying that she would not notify staff if she were to self harm, staff members and provider notified. Provider says she will come reassess pt shortly. Pt denies HI and physical pain or discomfort. Pt ate some oatmeal for breakfast, saying she does not like what is served for breakfast. Pt provided with new set of scrubs and toiletries as requested, pt currently taking shower.

## 2024-03-17 NOTE — ED Notes (Signed)
 Pt is sleeping, no acute distress noted.

## 2024-03-17 NOTE — ED Notes (Addendum)
 Pt is sleeping. Respirations are even and labored. No acute distress noted. Q15 safety checks in place per facility policy.

## 2024-03-17 NOTE — ED Provider Notes (Signed)
 Behavioral Health Progress Note  Date and Time: 03/17/2024 10:25 PM Name: Penny Berger MRN:  [id number]  Subjective:  This environment is so controlled  Diagnosis:  Final diagnoses:  Cocaine abuse with cocaine-induced mood disorder (HCC)  Persistent mood (affective) disorder, unspecified  Suicidal thoughts   Total Time spent with patient: 20 minutes  Penny Berger 48 year old female admitted to [facility] for crack cocaine detox  with hx of affective mood disorder, and recurrent passive suicidal thoughts. Patient seen today during rounds watching TV in dayroom, moved in therapy room for assessment.  BAL level negative and UDS positive for THC and cocaine.   Upon assessment, pt appears somewhat anxious and states that she is worried that residential treatment will be a very controlled environment like this.  She is aware that she has been accepted to [facility] for residential treatment on Monday, 03/21/2024.  Patient states that she would like time in between discharge here and admission to [facility] to take care of some errands and obtain her belongings from mom's house.  Patient seems somewhat indecisive about attending [facility] as she goes back and forth between needing to run certain errands, not wanting her mom to have to drive her around to these places, and inquiring about changing the admission date for [facility].  Patient made aware by social work team that she has a set date for admission on Monday.  Patient then asked about discharge and Saturday instead of Sunday so that she has time to get something done and get my belongings from her mom because he had not been getting along with everything going on.  Patient asked this provider to call mom and explained the plan with her, however patient unable to decide on exact plan details.  Informed patient that I will speak with mother tomorrow when the plan is established.  Patient reports that she did sleep well and has a good appetite.  She does report  some ongoing depression, stating I only really look forward to using crack cocaine.  But I do have a son with special needs who I want to have a better relationship with.  Patient denies active suicidal ideations but does report depressive symptoms including feeling guilty, sadness, irritability and not knowing her purpose in living.  She denies having any suicidal ideations, homicidal ideations and psychotic symptoms.  We discussed restarting an antidepressant as that has been effective for her before.  We initially discussed Wellbutrin to help with mood and stimulant cravings, however per chart patient has a history of possible seizure activity.  Patient discharged from [facility] last month on Prozac  and patient states that after discharge she was not compliant with her medication but that it was effective while in the hospital.  Will restart the Prozac  to target depressive mood symptoms. Patient reporting possible UTI symptoms including increased frequency with decreased output and urgency.  Informed patient that we would obtain a urine sample for a urinalysis to assess need for antibiotics.   Past Psychiatric History: MDD Hospitalizations: [facility] (June , 2024, Sept, 2025), [facility] (04/2022) Medication trials: Prozac , Abilify , Wellbutrin, Olanzapine , Trazodone , Effexor   Past Medical History: none reported Family History: none reported  Family Psychiatric History: Sister:depression; Father: substance use Social History: works full-time as a Lawyer; lives with parents    Additional Social History:                         Sleep: Fair  Appetite:  Good  Current Medications:  Current Facility-Administered Medications  Medication Dose Route Frequency Provider Last Rate Last Admin   acetaminophen  (TYLENOL ) tablet 650 mg  650 mg Oral Q6H PRN Hobson, Fran E, NP       alum & mag hydroxide-simeth (MAALOX/MYLANTA) 200-200-20 MG/5ML suspension 30 mL  30 mL Oral Q4H PRN Hobson, Fran E, NP       haloperidol   (HALDOL ) tablet 5 mg  5 mg Oral TID PRN Hobson, Fran E, NP       And   diphenhydrAMINE  (BENADRYL ) capsule 50 mg  50 mg Oral TID PRN Hobson, Fran E, NP       haloperidol  lactate (HALDOL ) injection 5 mg  5 mg Intramuscular TID PRN Hobson, Fran E, NP       And   diphenhydrAMINE  (BENADRYL ) injection 50 mg  50 mg Intramuscular TID PRN Hobson, Fran E, NP       And   LORazepam  (ATIVAN ) injection 2 mg  2 mg Intramuscular TID PRN Hobson, Fran E, NP       haloperidol  lactate (HALDOL ) injection 10 mg  10 mg Intramuscular TID PRN Hobson, Fran E, NP       And   diphenhydrAMINE  (BENADRYL ) injection 50 mg  50 mg Intramuscular TID PRN Hobson, Fran E, NP       And   LORazepam  (ATIVAN ) injection 2 mg  2 mg Intramuscular TID PRN Hobson, Fran E, NP       FLUoxetine  (PROZAC ) capsule 10 mg  10 mg Oral Daily Corrie Reder C, NP   10 mg at 03/17/24 1318   magnesium  hydroxide (MILK OF MAGNESIA) suspension 30 mL  30 mL Oral Daily PRN Hobson, Fran E, NP       Current Outpatient Medications  Medication Sig Dispense Refill   FLUoxetine  (PROZAC ) 20 MG capsule Take 1 capsule (20 mg total) by mouth daily. 30 capsule 0   medroxyPROGESTERone (DEPO-PROVERA) 150 MG/ML injection Inject 150 mg into the muscle every 3 (three) months.      Labs  Lab Results:  Admission on 03/15/2024, Discharged on 03/15/2024  Component Date Value Ref Range Status   WBC 03/15/2024 6.6  4.0 - 10.5 K/uL Final   RBC 03/15/2024 4.54  3.87 - 5.11 MIL/uL Final   Hemoglobin 03/15/2024 13.7  12.0 - 15.0 g/dL Final   HCT 90/69/7974 42.0  36.0 - 46.0 % Final   MCV 03/15/2024 92.5  80.0 - 100.0 fL Final   MCH 03/15/2024 30.2  26.0 - 34.0 pg Final   MCHC 03/15/2024 32.6  30.0 - 36.0 g/dL Final   RDW 90/69/7974 15.0  11.5 - 15.5 % Final   Platelets 03/15/2024 433 (H)  150 - 400 K/uL Final   nRBC 03/15/2024 0.0  0.0 - 0.2 % Final   Neutrophils Relative % 03/15/2024 58  % Final   Neutro Abs 03/15/2024 3.9  1.7 - 7.7 K/uL Final   Lymphocytes Relative  03/15/2024 29  % Final   Lymphs Abs 03/15/2024 1.9  0.7 - 4.0 K/uL Final   Monocytes Relative 03/15/2024 11  % Final   Monocytes Absolute 03/15/2024 0.7  0.1 - 1.0 K/uL Final   Eosinophils Relative 03/15/2024 1  % Final   Eosinophils Absolute 03/15/2024 0.1  0.0 - 0.5 K/uL Final   Basophils Relative 03/15/2024 1  % Final   Basophils Absolute 03/15/2024 0.1  0.0 - 0.1 K/uL Final   Immature Granulocytes 03/15/2024 0  % Final   Abs Immature Granulocytes 03/15/2024 0.02  0.00 - 0.07  K/uL Final   Performed at [facility], [address], [place], [place] [address]   Sodium 03/15/2024 139  135 - 145 mmol/L Final   Potassium 03/15/2024 4.1  3.5 - 5.1 mmol/L Final   Chloride 03/15/2024 108  98 - 111 mmol/L Final   CO2 03/15/2024 23  22 - 32 mmol/L Final   Glucose, Bld 03/15/2024 79  70 - 99 mg/dL Final   Glucose reference range applies only to samples taken after fasting for at least 8 hours.   BUN 03/15/2024 8  6 - 20 mg/dL Final   Creatinine, Ser 03/15/2024 0.64  0.44 - 1.00 mg/dL Final   Calcium 90/69/7974 9.0  8.9 - 10.3 mg/dL Final   Total Protein 90/69/7974 7.3  6.5 - 8.1 g/dL Final   Albumin 90/69/7974 3.7  3.5 - 5.0 g/dL Final   AST 90/69/7974 13 (L)  15 - 41 U/L Final   ALT 03/15/2024 10  0 - 44 U/L Final   Alkaline Phosphatase 03/15/2024 54  38 - 126 U/L Final   Total Bilirubin 03/15/2024 0.9  0.0 - 1.2 mg/dL Final   GFR, Estimated 03/15/2024 >60  >60 mL/min Final   Comment: (NOTE) Calculated using the CKD-EPI Creatinine Equation (2021)    Anion gap 03/15/2024 8  5 - 15 Final   Performed at [facility], [address] [place], [place] [address]   Alcohol, Ethyl (B) 03/15/2024 <15  <15 mg/dL Final   Comment: (NOTE) For medical purposes only. Performed at [facility], [address], [place], [place] [address]    Preg Test, Ur 03/15/2024 Negative  Negative Final   POC Amphetamine UR 03/15/2024 None Detected  NONE DETECTED (Cut Off Level 1000 ng/mL) Final    POC Secobarbital (BAR) 03/15/2024 None Detected  NONE DETECTED (Cut Off Level 300 ng/mL) Final   POC Buprenorphine (BUP) 03/15/2024 None Detected  NONE DETECTED (Cut Off Level 10 ng/mL) Final   POC Oxazepam (BZO) 03/15/2024 None Detected  NONE DETECTED (Cut Off Level 300 ng/mL) Final   POC Cocaine UR 03/15/2024 Positive (A)  NONE DETECTED (Cut Off Level 300 ng/mL) Final   POC Methamphetamine UR 03/15/2024 None Detected  NONE DETECTED (Cut Off Level 1000 ng/mL) Final   POC Morphine  03/15/2024 None Detected  NONE DETECTED (Cut Off Level 300 ng/mL) Final   POC Methadone UR 03/15/2024 None Detected  NONE DETECTED (Cut Off Level 300 ng/mL) Final   POC Oxycodone  UR 03/15/2024 None Detected  NONE DETECTED (Cut Off Level 100 ng/mL) Final   POC Marijuana UR 03/15/2024 Positive (A)  NONE DETECTED (Cut Off Level 50 ng/mL) Final   Preg Test, Ur 03/15/2024 NEGATIVE  NEGATIVE Final   Comment:        THE SENSITIVITY OF THIS METHODOLOGY IS >20 mIU/mL.   Admission on 02/16/2024, Discharged on 02/17/2024  Component Date Value Ref Range Status   Sodium 02/16/2024 141  135 - 145 mmol/L Final   Potassium 02/16/2024 3.8  3.5 - 5.1 mmol/L Final   Chloride 02/16/2024 108  98 - 111 mmol/L Final   CO2 02/16/2024 24  22 - 32 mmol/L Final   Glucose, Bld 02/16/2024 148 (H)  70 - 99 mg/dL Final   Glucose reference range applies only to samples taken after fasting for at least 8 hours.   BUN 02/16/2024 6  6 - 20 mg/dL Final   Creatinine, Ser 02/16/2024 0.84  0.44 - 1.00 mg/dL Final   Calcium 90/97/7974 9.2  8.9 - 10.3 mg/dL Final   Total Protein 90/97/7974 7.0  6.5 - 8.1 g/dL Final   Albumin 90/97/7974 3.7  3.5 - 5.0 g/dL Final   AST 90/97/7974 15  15 - 41 U/L Final   ALT 02/16/2024 9  0 - 44 U/L Final   Alkaline Phosphatase 02/16/2024 52  38 - 126 U/L Final   Total Bilirubin 02/16/2024 0.8  0.0 - 1.2 mg/dL Final   GFR, Estimated 02/16/2024 >60  >60 mL/min Final   Comment: (NOTE) Calculated using the CKD-EPI  Creatinine Equation (2021)    Anion gap 02/16/2024 9  5 - 15 Final   Performed at [facility], [address], [place], [place] [address]   Alcohol, Ethyl (B) 02/16/2024 <15  <15 mg/dL Final   Comment: (NOTE) For medical purposes only. Performed at [facility], [address] [place], [place] [address]    WBC 02/16/2024 6.5  4.0 - 10.5 K/uL Final   RBC 02/16/2024 4.59  3.87 - 5.11 MIL/uL Final   Hemoglobin 02/16/2024 13.9  12.0 - 15.0 g/dL Final   HCT 90/97/7974 43.4  36.0 - 46.0 % Final   MCV 02/16/2024 94.6  80.0 - 100.0 fL Final   MCH 02/16/2024 30.3  26.0 - 34.0 pg Final   MCHC 02/16/2024 32.0  30.0 - 36.0 g/dL Final   RDW 90/97/7974 14.7  11.5 - 15.5 % Final   Platelets 02/16/2024 446 (H)  150 - 400 K/uL Final   nRBC 02/16/2024 0.0  0.0 - 0.2 % Final   Performed at [facility], [address] [place], [place] [address]   Opiates 02/16/2024 NONE DETECTED  NONE DETECTED Final   Cocaine 02/16/2024 POSITIVE (A)  NONE DETECTED Final   Benzodiazepines 02/16/2024 NONE DETECTED  NONE DETECTED Final   Amphetamines 02/16/2024 NONE DETECTED  NONE DETECTED Final   Tetrahydrocannabinol 02/16/2024 NONE DETECTED  NONE DETECTED Final   Barbiturates 02/16/2024 NONE DETECTED  NONE DETECTED Final   Comment: (NOTE) DRUG SCREEN FOR MEDICAL PURPOSES ONLY.  IF CONFIRMATION IS NEEDED FOR ANY PURPOSE, NOTIFY LAB WITHIN 5 DAYS.  LOWEST DETECTABLE LIMITS FOR URINE DRUG SCREEN Drug Class                     Cutoff (ng/mL) Amphetamine and metabolites    1000 Barbiturate and metabolites    200 Benzodiazepine                 200 Opiates and metabolites        300 Cocaine and metabolites        300 THC                            50 Performed at [facility], [address] [place], [place] [address]    Preg, Serum 02/16/2024 NEGATIVE  NEGATIVE Final   Comment:        THE SENSITIVITY OF THIS METHODOLOGY IS >10 mIU/mL. Performed at [facility], [address] [place], [place] [address]    Salicylate Lvl 02/16/2024 <7.0 (L)  7.0 - 30.0 mg/dL Final   Performed at [facility], [address] [place], [place] [address]   Acetaminophen  (Tylenol ), Serum 02/16/2024 <10 (L)  10 - 30 ug/mL Final   Comment: (NOTE) Therapeutic concentrations vary significantly. A range of 10-30 ug/mL  may be an effective concentration for many patients.  However, some  are best treated at concentrations outside of this range. Acetaminophen  concentrations >150 ug/mL at 4 hours after ingestion  and >50 ug/mL at 12 hours after ingestion are often associated with  toxic reactions.  Performed at [facility], [address], [place], [place] [address]    Sodium 02/16/2024 143  135 - 145 mmol/L Final   Potassium 02/16/2024 3.8  3.5 - 5.1 mmol/L Final   Chloride 02/16/2024 109  98 - 111 mmol/L Final   BUN 02/16/2024 7  6 - 20 mg/dL Final   Creatinine, Ser 02/16/2024 0.80  0.44 - 1.00 mg/dL Final   Glucose, Bld 90/97/7974 145 (H)  70 - 99 mg/dL Final   Glucose reference range applies only to samples taken after fasting for at least 8 hours.   Calcium, Ion 02/16/2024 1.13 (L)  1.15 - 1.40 mmol/L Final   TCO2 02/16/2024 21 (L)  22 - 32 mmol/L Final   Hemoglobin 02/16/2024 14.6  12.0 - 15.0 g/dL Final   HCT 90/97/7974 43.0  36.0 - 46.0 % Final   Acetaminophen  (Tylenol ), Serum 02/16/2024 <10 (L)  10 - 30 ug/mL Final   Comment: (NOTE) Therapeutic concentrations vary significantly. A range of 10-30 ug/mL  may be an effective concentration for many patients. However, some  are best treated at concentrations outside of this range. Acetaminophen  concentrations >150 ug/mL at 4 hours after ingestion  and >50 ug/mL at 12 hours after ingestion are often associated with  toxic reactions.  Performed at [facility], [address], [place], [place] [address]     Blood Alcohol level:  Lab Results  Component Value Date   [facility] <15 03/15/2024   ETH <15  02/16/2024    Metabolic Disorder Labs: Lab Results  Component Value Date   HGBA1C 6.1 (H) 11/17/2022   MPG 128 11/17/2022   MPG 116.89 05/02/2022   Lab Results  Component Value Date   PROLACTIN 13.7 11/17/2022   PROLACTIN 5.0 05/02/2022   Lab Results  Component Value Date   CHOL 229 (H) 11/17/2022   TRIG 113 11/17/2022   HDL 37 (L) 11/17/2022   CHOLHDL 6.2 11/17/2022   VLDL 23 11/17/2022   LDLCALC 169 (H) 11/17/2022   LDLCALC 154 (H) 05/02/2022    Therapeutic Lab Levels: No results found for: LITHIUM  No results found for: VALPROATE No results found for: CBMZ  Physical Findings   AUDIT    Flowsheet Row [facility] Ad[facility] (Dis[facility]) [facility] Admission (Discharged[facility]  Alcohol Use Disorder Identification Test Final Score (AUDIT) 0 2 9   PHQ2-9    Flowsheet Row ED from 03/15/2024 in [facility]  PHQ-2 Total Score 2  PHQ-9 Total Score 5   Flowsheet Row ED from 03/15/2024 in [facility] Most recent reading at 03/17/2024 12:24 PM ED from 03/15/2024 in [facility] Most recent reading at 03/15/2024 10:44 AM Ad[facility] (Discharged[facility] Most recent reading at 02/17/2024  4:00 PM  C-SSRS RISK CATEGORY Error: Q3, 4, or 5 should not be populated when Q2 is No High Risk High Risk     Musculoskeletal  Strength & Muscle Tone: within normal limits Gait & Station: normal Patient leans: N/A  Psychiatric Specialty Exam  Presentation  General Appearance:  Appropriate for Environment; Casual  Eye Contact: Good  Speech: Clear and Coherent; Normal Rate  Speech Volume: Normal  Handedness: Right   Mood and Affect  Mood: Depressed;  Hopeless  Affect: Congruent   Thought Process  Thought Processes: Coherent  Descriptions of Associations:Intact  Orientation:Full (Time, Place and Person)  Thought Content:Logical  Diagnosis of Schizophrenia or Schizoaffective disorder in past: No data recorded   Hallucinations:No data recorded Ideas of Reference:None  Suicidal Thoughts:No data recorded Homicidal Thoughts:No data recorded  Sensorium  Memory: Recent Good; Immediate Good  Judgment: Fair  Insight: Fair   Art therapist  Concentration: Fair  Attention Span: Fair  Recall: Good  Fund of Knowledge: Good  Language: Good   Psychomotor Activity  Psychomotor Activity: Normal   Assets  Assets: Communication Skills; Desire for Improvement; Housing; Vocational/Educational; Resilience   Sleep  Sleep:No data recorded Estimated Sleeping Duration (Last 24 Hours): 8.50-10.75 hours  No data recorded  Physical Exam  Physical Exam Vitals and nursing note reviewed.  Constitutional:      General: She is not in acute distress.    Appearance: Normal appearance. She is not ill-appearing.  HENT:     Head: Normocephalic and atraumatic.     Nose: Nose normal.  Eyes:     Extraocular Movements: Extraocular movements intact.     Conjunctiva/sclera: Conjunctivae normal.     Pupils: Pupils are equal, round, and reactive to light.  Cardiovascular:     Rate and Rhythm: Normal rate and regular rhythm.  Pulmonary:     Effort: Pulmonary effort is normal.     Breath sounds: Normal breath sounds.  Musculoskeletal:        General: Normal range of motion.     Cervical back: Normal range of motion and neck supple.  Skin:    General: Skin is warm and dry.  Neurological:     General: No focal deficit present.     Mental Status: She is alert and oriented to person, place, and time.     Review of Systems  Constitutional: Negative.   HENT: Negative.    Eyes: Negative.   Respiratory: Negative.     Cardiovascular: Negative.   Gastrointestinal: Negative.   Genitourinary:  Positive for frequency and urgency.  Musculoskeletal: Negative.   Neurological: Negative.   Endo/Heme/Allergies: Negative.   Psychiatric/Behavioral:  Positive for depression, substance abuse and suicidal ideas.    Blood pressure 102/63, pulse 64, temperature 98.7 F (37.1 C), temperature source Oral, resp. rate 16, SpO2 99%. There is no height or weight on file to calculate BMI.  Treatment Plan Summary: - Pt will continue treatment at same until discharged to [facility] for [facility]. - Pt is encouraged to engage in groups and milieu activities and limit isolating in room.  - SW to continue follow-up with patient request to directly admit to [facility] from the community  - Restart Prozac  at 10 mg for ongoing depressive symptoms.  Patient recently discharged from inpatient hospital on this medication and reports that it had been effective but that she was noncompliant once discharged. - UA ordered to assess for UTI       Alan JAYSON Mcardle, NP 03/17/2024 10:25 PM

## 2024-03-17 NOTE — Group Note (Signed)
 Group Topic: Social Support  Group Date: 03/17/2024 Start Time: 2000 End Time: 2030 Facilitators: Joan Plowman B  Department: [facility]  Number of Participants: 13  Group Focus: affirmation, communication, and social skills Treatment Modality:  Individual Therapy Interventions utilized were support Purpose: express feelings  Name: Penny Berger Date of Birth: 1975/10/11  MR: [id number]    Level of Participation: active Quality of Participation: attentive Interactions with others: gave feedback Mood/Affect: appropriate Triggers (if applicable): NA Cognition: coherent/clear Progress: Gaining insight Response: NA Plan: patient will be encouraged to keep going to grousps  Patients Problems:  Patient Active Problem List   Diagnosis Date Noted   MDD (major depressive disorder), recurrent episode, moderate (HCC) 03/15/2024   Intentional overdose (HCC) 02/17/2024   MDD (major depressive disorder), severe (HCC) 02/17/2024   Suicidal thoughts 11/17/2022   Severe episode of recurrent major depressive disorder, without psychotic features (HCC) 05/03/2022   Suicidal behavior with attempted self-injury (HCC) 05/03/2022   Cocaine use disorder (HCC) 05/03/2022   Alcohol use disorder, moderate, dependence (HCC) 05/03/2022   Marijuana smoker, continuous 05/03/2022

## 2024-03-17 NOTE — ED Notes (Signed)
 Pt declined educational leaflet r/t new medication - prozac .

## 2024-03-17 NOTE — Group Note (Signed)
 Group Topic: Decisional Balance/Substance Abuse  Group Date: 03/17/2024 Start Time: 1225 End Time: 1330 Facilitators: Lonzell Dwayne RAMAN, NT MHT 2 Department: [facility]  Number of Participants: 3  Group Focus: abuse issues Treatment Modality:  Patient-Centered Therapy Interventions utilized were reality testing Purpose: relapse prevention strategies  Name: Penny Berger Date of Birth: 1976-04-09  MR: [id number]    Level of Participation: Patient did not attend group Quality of Participation: N/A Interactions with others: N/A Mood/Affect: N/A Triggers (if applicable): N/A Cognition: N/a Progress: N/A Response: N/A Plan: N/A  Patients Problems:  Patient Active Problem List   Diagnosis Date Noted   MDD (major depressive disorder), recurrent episode, moderate (HCC) 03/15/2024   Intentional overdose (HCC) 02/17/2024   MDD (major depressive disorder), severe (HCC) 02/17/2024   Suicidal thoughts 11/17/2022   Severe episode of recurrent major depressive disorder, without psychotic features (HCC) 05/03/2022   Suicidal behavior with attempted self-injury (HCC) 05/03/2022   Cocaine use disorder (HCC) 05/03/2022   Alcohol use disorder, moderate, dependence (HCC) 05/03/2022   Marijuana smoker, continuous 05/03/2022

## 2024-03-17 NOTE — ED Provider Notes (Signed)
 Behavioral Health Progress Note  Date and Time: 03/17/2024 2:31 PM Name: Penny Berger MRN:  [id number]  Subjective:  The only thing I look forward to is the cocaine  Diagnosis:  Final diagnoses:  Cocaine abuse with cocaine-induced mood disorder (HCC)  Persistent mood (affective) disorder, unspecified  Suicidal thoughts   Total Time spent with patient: 30 minutes  Penny Berger 48 year old female with a severe, cocaine dependence, affective mood disorder, and recurrent passive suicidal thoughts. Patient seen today during rounds, in patient room. Patient is very labile today. Patient seen by SW earlier today and advised that she was accepted to [facility]. Patient tells this Clinical research associate that she needs time to figure out how this process works and subsequently tells this Clinical research associate, that she thinks she would like to leave this facility, return home and take care of her nails, check in with her employer and then present on her own to [facility]. Pt states this process here is going to fast, I need some time. Pt further reports,she has been in several different programs, taken psychiatric medications, and typically will relapse the next day after leaving a program. Pt states the cocaine is the only thing she has to look forward to and that makes her happy. Pt endorses that she doesn't know how to cope with life. Patient doesn't have a therapist and doesn't feel therapy would help either. Patient is indecisive and mood is very labile. Pt further states, I just wish I could I die sometimes. Pt clarifies that she would never do anything to harm herself as she is too afraid of harming herself or dying. Pt wishes to speal with SW regarding option of having [facility] bed held while she discharges from here and handles personal affairs.   Past Psychiatric History: MDD Hospitalizations: [facility] (June , 2024, Sept, 2025), [facility] (04/2022) Medication trials: Prozac , Abilify , Wellbutrin, Olanzapine , Trazodone , Effexor   Past  Medical History: none reported Family History: none reported  Family Psychiatric History: Sister:depression; Father: substance use Social History: works full-time as a Lawyer; lives with parents    Additional Social History:                         Sleep: Fair  Appetite:  Good  Current Medications:  Current Facility-Administered Medications  Medication Dose Route Frequency Provider Last Rate Last Admin   acetaminophen  (TYLENOL ) tablet 650 mg  650 mg Oral Q6H PRN Hobson, Fran E, NP       alum & mag hydroxide-simeth (MAALOX/MYLANTA) 200-200-20 MG/5ML suspension 30 mL  30 mL Oral Q4H PRN Hobson, Fran E, NP       haloperidol  (HALDOL ) tablet 5 mg  5 mg Oral TID PRN Hobson, Fran E, NP       And   diphenhydrAMINE  (BENADRYL ) capsule 50 mg  50 mg Oral TID PRN Hobson, Fran E, NP       haloperidol  lactate (HALDOL ) injection 5 mg  5 mg Intramuscular TID PRN Hobson, Fran E, NP       And   diphenhydrAMINE  (BENADRYL ) injection 50 mg  50 mg Intramuscular TID PRN Hobson, Fran E, NP       And   LORazepam  (ATIVAN ) injection 2 mg  2 mg Intramuscular TID PRN Hobson, Fran E, NP       haloperidol  lactate (HALDOL ) injection 10 mg  10 mg Intramuscular TID PRN Hobson, Fran E, NP       And   diphenhydrAMINE  (BENADRYL ) injection 50 mg  50  mg Intramuscular TID PRN Hobson, Fran E, NP       And   LORazepam  (ATIVAN ) injection 2 mg  2 mg Intramuscular TID PRN Hobson, Fran E, NP       FLUoxetine  (PROZAC ) capsule 10 mg  10 mg Oral Daily Brent, Amanda C, NP   10 mg at 03/17/24 1318   magnesium  hydroxide (MILK OF MAGNESIA) suspension 30 mL  30 mL Oral Daily PRN Hobson, Fran E, NP       Current Outpatient Medications  Medication Sig Dispense Refill   FLUoxetine  (PROZAC ) 20 MG capsule Take 1 capsule (20 mg total) by mouth daily. 30 capsule 0   medroxyPROGESTERone (DEPO-PROVERA) 150 MG/ML injection Inject 150 mg into the muscle every 3 (three) months.      Labs  Lab Results:  Admission on 03/15/2024,  Discharged on 03/15/2024  Component Date Value Ref Range Status   WBC 03/15/2024 6.6  4.0 - 10.5 K/uL Final   RBC 03/15/2024 4.54  3.87 - 5.11 MIL/uL Final   Hemoglobin 03/15/2024 13.7  12.0 - 15.0 g/dL Final   HCT 90/69/7974 42.0  36.0 - 46.0 % Final   MCV 03/15/2024 92.5  80.0 - 100.0 fL Final   MCH 03/15/2024 30.2  26.0 - 34.0 pg Final   MCHC 03/15/2024 32.6  30.0 - 36.0 g/dL Final   RDW 90/69/7974 15.0  11.5 - 15.5 % Final   Platelets 03/15/2024 433 (H)  150 - 400 K/uL Final   nRBC 03/15/2024 0.0  0.0 - 0.2 % Final   Neutrophils Relative % 03/15/2024 58  % Final   Neutro Abs 03/15/2024 3.9  1.7 - 7.7 K/uL Final   Lymphocytes Relative 03/15/2024 29  % Final   Lymphs Abs 03/15/2024 1.9  0.7 - 4.0 K/uL Final   Monocytes Relative 03/15/2024 11  % Final   Monocytes Absolute 03/15/2024 0.7  0.1 - 1.0 K/uL Final   Eosinophils Relative 03/15/2024 1  % Final   Eosinophils Absolute 03/15/2024 0.1  0.0 - 0.5 K/uL Final   Basophils Relative 03/15/2024 1  % Final   Basophils Absolute 03/15/2024 0.1  0.0 - 0.1 K/uL Final   Immature Granulocytes 03/15/2024 0  % Final   Abs Immature Granulocytes 03/15/2024 0.02  0.00 - 0.07 K/uL Final   Performed at [facility], [address] [place], [place] [address]   Sodium 03/15/2024 139  135 - 145 mmol/L Final   Potassium 03/15/2024 4.1  3.5 - 5.1 mmol/L Final   Chloride 03/15/2024 108  98 - 111 mmol/L Final   CO2 03/15/2024 23  22 - 32 mmol/L Final   Glucose, Bld 03/15/2024 79  70 - 99 mg/dL Final   Glucose reference range applies only to samples taken after fasting for at least 8 hours.   BUN 03/15/2024 8  6 - 20 mg/dL Final   Creatinine, Ser 03/15/2024 0.64  0.44 - 1.00 mg/dL Final   Calcium 90/69/7974 9.0  8.9 - 10.3 mg/dL Final   Total Protein 90/69/7974 7.3  6.5 - 8.1 g/dL Final   Albumin 90/69/7974 3.7  3.5 - 5.0 g/dL Final   AST 90/69/7974 13 (L)  15 - 41 U/L Final   ALT 03/15/2024 10  0 - 44 U/L Final   Alkaline Phosphatase  03/15/2024 54  38 - 126 U/L Final   Total Bilirubin 03/15/2024 0.9  0.0 - 1.2 mg/dL Final   GFR, Estimated 03/15/2024 >60  >60 mL/min Final   Comment: (NOTE) Calculated  using the CKD-EPI Creatinine Equation (2021)    Anion gap 03/15/2024 8  5 - 15 Final   Performed at [facility], [address], [place], [place] [address]   Alcohol, Ethyl (B) 03/15/2024 <15  <15 mg/dL Final   Comment: (NOTE) For medical purposes only. Performed at [facility], [address], [place], [place] [address]    Preg Test, Ur 03/15/2024 Negative  Negative Final   POC Amphetamine UR 03/15/2024 None Detected  NONE DETECTED (Cut Off Level 1000 ng/mL) Final   POC Secobarbital (BAR) 03/15/2024 None Detected  NONE DETECTED (Cut Off Level 300 ng/mL) Final   POC Buprenorphine (BUP) 03/15/2024 None Detected  NONE DETECTED (Cut Off Level 10 ng/mL) Final   POC Oxazepam (BZO) 03/15/2024 None Detected  NONE DETECTED (Cut Off Level 300 ng/mL) Final   POC Cocaine UR 03/15/2024 Positive (A)  NONE DETECTED (Cut Off Level 300 ng/mL) Final   POC Methamphetamine UR 03/15/2024 None Detected  NONE DETECTED (Cut Off Level 1000 ng/mL) Final   POC Morphine  03/15/2024 None Detected  NONE DETECTED (Cut Off Level 300 ng/mL) Final   POC Methadone UR 03/15/2024 None Detected  NONE DETECTED (Cut Off Level 300 ng/mL) Final   POC Oxycodone  UR 03/15/2024 None Detected  NONE DETECTED (Cut Off Level 100 ng/mL) Final   POC Marijuana UR 03/15/2024 Positive (A)  NONE DETECTED (Cut Off Level 50 ng/mL) Final   Preg Test, Ur 03/15/2024 NEGATIVE  NEGATIVE Final   Comment:        THE SENSITIVITY OF THIS METHODOLOGY IS >20 mIU/mL.   Admission on 02/16/2024, Discharged on 02/17/2024  Component Date Value Ref Range Status   Sodium 02/16/2024 141  135 - 145 mmol/L Final   Potassium 02/16/2024 3.8  3.5 - 5.1 mmol/L Final   Chloride 02/16/2024 108  98 - 111 mmol/L Final   CO2 02/16/2024 24  22 - 32 mmol/L Final   Glucose, Bld 02/16/2024  148 (H)  70 - 99 mg/dL Final   Glucose reference range applies only to samples taken after fasting for at least 8 hours.   BUN 02/16/2024 6  6 - 20 mg/dL Final   Creatinine, Ser 02/16/2024 0.84  0.44 - 1.00 mg/dL Final   Calcium 90/97/7974 9.2  8.9 - 10.3 mg/dL Final   Total Protein 90/97/7974 7.0  6.5 - 8.1 g/dL Final   Albumin 90/97/7974 3.7  3.5 - 5.0 g/dL Final   AST 90/97/7974 15  15 - 41 U/L Final   ALT 02/16/2024 9  0 - 44 U/L Final   Alkaline Phosphatase 02/16/2024 52  38 - 126 U/L Final   Total Bilirubin 02/16/2024 0.8  0.0 - 1.2 mg/dL Final   GFR, Estimated 02/16/2024 >60  >60 mL/min Final   Comment: (NOTE) Calculated using the CKD-EPI Creatinine Equation (2021)    Anion gap 02/16/2024 9  5 - 15 Final   Performed at [facility], [address], [place], [place] [address]   Alcohol, Ethyl (B) 02/16/2024 <15  <15 mg/dL Final   Comment: (NOTE) For medical purposes only. Performed at [facility], [address], [place], [place] [address]    WBC 02/16/2024 6.5  4.0 - 10.5 K/uL Final   RBC 02/16/2024 4.59  3.87 - 5.11 MIL/uL Final   Hemoglobin 02/16/2024 13.9  12.0 - 15.0 g/dL Final   HCT 90/97/7974 43.4  36.0 - 46.0 % Final   MCV 02/16/2024 94.6  80.0 - 100.0  fL Final   MCH 02/16/2024 30.3  26.0 - 34.0 pg Final   MCHC 02/16/2024 32.0  30.0 - 36.0 g/dL Final   RDW 90/97/7974 14.7  11.5 - 15.5 % Final   Platelets 02/16/2024 446 (H)  150 - 400 K/uL Final   nRBC 02/16/2024 0.0  0.0 - 0.2 % Final   Performed at [facility], [address] [place], [place] [address]   Opiates 02/16/2024 NONE DETECTED  NONE DETECTED Final   Cocaine 02/16/2024 POSITIVE (A)  NONE DETECTED Final   Benzodiazepines 02/16/2024 NONE DETECTED  NONE DETECTED Final   Amphetamines 02/16/2024 NONE DETECTED  NONE DETECTED Final   Tetrahydrocannabinol 02/16/2024 NONE DETECTED  NONE DETECTED Final   Barbiturates 02/16/2024 NONE DETECTED  NONE DETECTED Final   Comment: (NOTE) DRUG SCREEN  FOR MEDICAL PURPOSES ONLY.  IF CONFIRMATION IS NEEDED FOR ANY PURPOSE, NOTIFY LAB WITHIN 5 DAYS.  LOWEST DETECTABLE LIMITS FOR URINE DRUG SCREEN Drug Class                     Cutoff (ng/mL) Amphetamine and metabolites    1000 Barbiturate and metabolites    200 Benzodiazepine                 200 Opiates and metabolites        300 Cocaine and metabolites        300 THC                            50 Performed at [facility], [address] [place], [place] [address]    Preg, Serum 02/16/2024 NEGATIVE  NEGATIVE Final   Comment:        THE SENSITIVITY OF THIS METHODOLOGY IS >10 mIU/mL. Performed at [facility], [address] [place], [place] [address]    Salicylate Lvl 02/16/2024 <7.0 (L)  7.0 - 30.0 mg/dL Final   Performed at [facility], [address], [place], [place] [address]   Acetaminophen  (Tylenol ), Serum 02/16/2024 <10 (L)  10 - 30 ug/mL Final   Comment: (NOTE) Therapeutic concentrations vary significantly. A range of 10-30 ug/mL  may be an effective concentration for many patients. However, some  are best treated at concentrations outside of this range. Acetaminophen  concentrations >150 ug/mL at 4 hours after ingestion  and >50 ug/mL at 12 hours after ingestion are often associated with  toxic reactions.  Performed at [facility], [address] [place], [place] [address]    Sodium 02/16/2024 143  135 - 145 mmol/L Final   Potassium 02/16/2024 3.8  3.5 - 5.1 mmol/L Final   Chloride 02/16/2024 109  98 - 111 mmol/L Final   BUN 02/16/2024 7  6 - 20 mg/dL Final   Creatinine, Ser 02/16/2024 0.80  0.44 - 1.00 mg/dL Final   Glucose, Bld 90/97/7974 145 (H)  70 - 99 mg/dL Final   Glucose reference range applies only to samples taken after fasting for at least 8 hours.   Calcium, Ion 02/16/2024 1.13 (L)  1.15 - 1.40 mmol/L Final   TCO2 02/16/2024 21 (L)  22 - 32 mmol/L Final   Hemoglobin 02/16/2024 14.6  12.0 - 15.0 g/dL Final   HCT  90/97/7974 43.0  36.0 - 46.0 % Final   Acetaminophen  (Tylenol ), Serum 02/16/2024 <10 (L)  10 - 30 ug/mL Final   Comment: (NOTE) Therapeutic concentrations vary significantly. A  range of 10-30 ug/mL  may be an effective concentration for many patients. However, some  are best treated at concentrations outside of this range. Acetaminophen  concentrations >150 ug/mL at 4 hours after ingestion  and >50 ug/mL at 12 hours after ingestion are often associated with  toxic reactions.  Performed at [facility], [address], [place], [place] [address]     Blood Alcohol level:  Lab Results  Component Value Date   [facility] <15 03/15/2024   ETH <15 02/16/2024    Metabolic Disorder Labs: Lab Results  Component Value Date   HGBA1C 6.1 (H) 11/17/2022   MPG 128 11/17/2022   MPG 116.89 05/02/2022   Lab Results  Component Value Date   PROLACTIN 13.7 11/17/2022   PROLACTIN 5.0 05/02/2022   Lab Results  Component Value Date   CHOL 229 (H) 11/17/2022   TRIG 113 11/17/2022   HDL 37 (L) 11/17/2022   CHOLHDL 6.2 11/17/2022   VLDL 23 11/17/2022   LDLCALC 169 (H) 11/17/2022   LDLCALC 154 (H) 05/02/2022    Therapeutic Lab Levels: No results found for: LITHIUM  No results found for: VALPROATE No results found for: CBMZ  Physical Findings   AUDIT    Flowsheet Row [facility] Ad[facility] (Dis[facility]) [facility] Ad[facility] (Dis[facility]  Alcohol Use Disorder Identification Test Final Score (AUDIT) 0 2 9   PHQ2-9    Flowsheet Row ED from 03/15/2024 in [facility]  PHQ-2 Total Score 2  PHQ-9 Total Score 5   Flowsheet Row ED from 03/15/2024 in [facility] Most recent reading at 03/17/2024 12:24 PM ED from 03/15/2024 in [facility] Most recent reading at 03/15/2024 10:44 AM Ad[facility] (Discharged[facility] Most recent reading at 02/17/2024  4:00 PM  C-SSRS RISK CATEGORY Error: Q3, 4, or 5 should not be populated when Q2 is No High Risk High Risk     Musculoskeletal  Strength & Muscle Tone: within normal limits Gait & Station: normal Patient leans: N/A  Psychiatric Specialty Exam  Presentation  General Appearance:  Appropriate for Environment; Casual  Eye Contact: Good  Speech: Clear and Coherent; Normal Rate  Speech Volume: Normal  Handedness: Right   Mood and Affect  Mood: Depressed; Hopeless  Affect: Congruent   Thought Process  Thought Processes: Coherent  Descriptions of Associations:Intact  Orientation:Full (Time, Place and Person)  Thought Content:Logical  Diagnosis of Schizophrenia or Schizoaffective disorder in past: No data recorded   Hallucinations:No data recorded Ideas of Reference:None  Suicidal Thoughts:No data recorded Homicidal Thoughts:No data recorded  Sensorium  Memory: Recent Good; Immediate Good  Judgment: Fair  Insight: Fair   Art therapist  Concentration: Fair  Attention Span: Fair  Recall: Good  Fund of Knowledge: Good  Language: Good   Psychomotor Activity  Psychomotor Activity: Normal   Assets  Assets: Communication Skills; Desire for Improvement; Housing; Vocational/Educational; Resilience   Sleep  Sleep:No data recorded Estimated Sleeping Duration (Last 24 Hours): 10.75-13.50 hours  No data recorded  Physical Exam  Physical Exam Constitutional:      General: She is not in acute distress.    Appearance: Normal appearance. She is not ill-appearing.  HENT:     Head: Normocephalic and atraumatic.     Nose: Nose normal.  Eyes:  Extraocular Movements: Extraocular movements intact.     Conjunctiva/sclera: Conjunctivae normal.      Pupils: Pupils are equal, round, and reactive to light.  Cardiovascular:     Rate and Rhythm: Normal rate and regular rhythm.  Pulmonary:     Effort: Pulmonary effort is normal.     Breath sounds: Normal breath sounds.  Musculoskeletal:     Cervical back: Normal range of motion and neck supple.  Skin:    General: Skin is warm and dry.  Neurological:     General: No focal deficit present.     Mental Status: She is alert and oriented to person, place, and time.     Review of Systems  Psychiatric/Behavioral:  Positive for depression, substance abuse and suicidal ideas.    Blood pressure 116/79, pulse 87, temperature 98.7 F (37.1 C), temperature source Oral, resp. rate 17, SpO2 100%. There is no height or weight on file to calculate BMI.  Treatment Plan Summary: Daily contact with patient to assess and evaluate symptoms and progress in treatment, Medication management, and Plan :  Pt would benefit from starting mood stabilizing treatment as  current presentation is suspicious for underlying bipolar 1 or 2, alternatively MDD with atypical features. Pt is not interested in discussing medications today. Lamictal and Abilify  vs Seroquel and Lamictal or Depakote would be beneficial. Pt has hx of poor medications compliance, tx with a antipsychotic with LAI option may improve medications compliance.  -Pt is encouraged to engage in groups and milieu activities and limit isolating in room.  -SW to will follow-up with patient request to directly admit to [facility] from the community  -Treatment team will continue to round on patient daily and evaluate patient needs and readiness to discharge  Anticipated discharge: 03/20/2024     Suzen Lesches, NP 03/17/2024 2:31 PM

## 2024-03-17 NOTE — ED Notes (Signed)
 Pt ate dinner. Presents as somewhat frustrated at times. No further questions r/t upcoming discharge.

## 2024-03-17 NOTE — ED Notes (Signed)
 Pt is sleeping at the moment, no acute distress noted. Q15 safety checks in place.

## 2024-03-17 NOTE — ED Notes (Signed)
 SI clarification. Pt says she feels like she does not want to live and has no reasons to live, however , does not have plan, intent, or 'the courage' to harm self. Pt says that staff does not need to be concerned about her safety. Pt denies having a therapist.

## 2024-03-18 DIAGNOSIS — F1410 Cocaine abuse, uncomplicated: Secondary | ICD-10-CM | POA: Diagnosis not present

## 2024-03-18 DIAGNOSIS — F331 Major depressive disorder, recurrent, moderate: Secondary | ICD-10-CM | POA: Diagnosis not present

## 2024-03-18 DIAGNOSIS — R45851 Suicidal ideations: Secondary | ICD-10-CM | POA: Diagnosis not present

## 2024-03-18 LAB — URINALYSIS, ROUTINE W REFLEX MICROSCOPIC
Bilirubin Urine: NEGATIVE
Glucose, UA: NEGATIVE mg/dL
Hgb urine dipstick: NEGATIVE
Ketones, ur: NEGATIVE mg/dL
Leukocytes,Ua: NEGATIVE
Nitrite: NEGATIVE
Protein, ur: NEGATIVE mg/dL
Specific Gravity, Urine: 1.012 (ref 1.005–1.030)
pH: 7 (ref 5.0–8.0)

## 2024-03-18 MED ORDER — FLUOXETINE HCL 20 MG PO CAPS: 20.0000 mg | ORAL_CAPSULE | Freq: Every day | ORAL | 0 refills | Status: AC

## 2024-03-18 MED ORDER — FLUOXETINE HCL 20 MG PO CAPS
20.0000 mg | ORAL_CAPSULE | Freq: Every day | ORAL | Status: DC
  Administered 2024-03-19: 20 mg via ORAL
  Filled 2024-03-18: qty 7
  Filled 2024-03-18: qty 1

## 2024-03-18 NOTE — Group Note (Signed)
 Group Topic: Communication  Group Date: 03/18/2024 Start Time: 2000 End Time: 2030 Facilitators: Joan Plowman B  Department: [facility]  Number of Participants: 10  Group Focus: daily focus and goals/reality orientation Treatment Modality:  Individual Therapy Interventions utilized were support Purpose: express feelings  Name: Penny Berger Date of Birth: Feb 03, 1976  MR: [id number]    Level of Participation: active Quality of Participation: attentive and cooperative Interactions with others: gave feedback Mood/Affect: appropriate Triggers (if applicable): NA Cognition: coherent/clear Progress: Gaining insight Response: NA Plan: patient will be encouraged to keep going to groups  Patients Problems:  Patient Active Problem List   Diagnosis Date Noted   MDD (major depressive disorder), recurrent episode, moderate (HCC) 03/15/2024   Intentional overdose (HCC) 02/17/2024   MDD (major depressive disorder), severe (HCC) 02/17/2024   Suicidal thoughts 11/17/2022   Severe episode of recurrent major depressive disorder, without psychotic features (HCC) 05/03/2022   Suicidal behavior with attempted self-injury (HCC) 05/03/2022   Cocaine use disorder (HCC) 05/03/2022   Alcohol use disorder, moderate, dependence (HCC) 05/03/2022   Marijuana smoker, continuous 05/03/2022

## 2024-03-18 NOTE — Discharge Instructions (Signed)
 [facility] Care Management   Per Rosaline, patient was accepted to [facility] on Monday 05-21-2024 at 9am.   [address] [place], [place]  [phone]   The patient mother will pick up the patient on Saturday at 11am from the [facility].  The patient mother will transport the patient to [facility] on Monday 05-21-2024.

## 2024-03-18 NOTE — ED Provider Notes (Signed)
 Behavioral Health Progress Note  Date and Time: 03/18/2024 10:13 AM Name: Penny Berger MRN:  [id number]  Subjective:  Im not sure what I should do  Diagnosis:  Final diagnoses:  Cocaine abuse with cocaine-induced mood disorder (HCC)  Persistent mood (affective) disorder, unspecified  Suicidal thoughts   Total Time spent with patient: 20 minutes  Penny Berger 48 year old female admitted to [facility] on 03/15/24 for crack cocaine detox  with hx of affective mood disorder and recurrent passive suicidal thoughts.  BAL level negative and UDS positive for THC and cocaine.   Upon assessment, pt is laying in bed in no acute distress.  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 We discussed the importance of focusing on herself and getting to a maintenance phase of sobriety so that she can return to work improved and not lose all of her money to drugs. She is assured that she will receive a work note showing the dates of hospitalization/ treatment. Pt decided that she would like to discharge on Saturday and attend [facility] on Monday as planned.   Patient reports that she did sleep well and has a good appetite.  She continues to report a depressed mood but reports feeling better since discussing the plan of treatment.  Patient denies active suicidal ideations and is focusing on living for herself and her son, instead of the drugs.  She denies homicidal ideations and psychotic symptoms.  Pt was restarted on Prozac  10mg  yesterday and denies any side effects or concerns at this time. Will plan to increase her Prozac  to 20mg  tomorrow, as she was prescribed.    Patient reporting possible UTI symptoms including increased frequency with decreased output and urgency. Reminded pt that she would need to provide a urine sample for testing.    Past Psychiatric History:  MDD Hospitalizations: [facility] (June , 2024, Sept, 2025), [facility] (04/2022) Medication trials: Prozac , Abilify , Wellbutrin, Olanzapine , Trazodone , Effexor   Past Medical History: none reported Family History: none reported  Family Psychiatric History: Sister:depression; Father: substance use Social History: works full-time as a Lawyer; lives with parents    Additional Social History:                         Sleep: Fair  Appetite:  Good  Current Medications:  Current Facility-Administered Medications  Medication Dose Route Frequency Provider Last Rate Last Admin   acetaminophen  (TYLENOL ) tablet 650 mg  650 mg Oral Q6H PRN Hobson, Fran E, NP       alum & mag hydroxide-simeth (MAALOX/MYLANTA) 200-200-20 MG/5ML suspension 30 mL  30 mL Oral Q4H PRN Hobson, Fran E, NP       haloperidol  (HALDOL ) tablet 5 mg  5 mg Oral TID PRN Hobson, Fran E, NP       And   diphenhydrAMINE  (BENADRYL ) capsule 50 mg  50 mg Oral TID PRN Hobson, Fran E, NP       haloperidol  lactate (HALDOL ) injection 5 mg  5 mg Intramuscular TID PRN Hobson, Fran E, NP       And   diphenhydrAMINE  (BENADRYL ) injection 50 mg  50 mg Intramuscular TID PRN Hobson, Fran E, NP       And   LORazepam  (ATIVAN ) injection 2 mg  2 mg Intramuscular TID PRN Hobson, Fran E, NP       haloperidol   lactate (HALDOL ) injection 10 mg  10 mg Intramuscular TID PRN Hobson, Fran E, NP       And   diphenhydrAMINE  (BENADRYL ) injection 50 mg  50 mg Intramuscular TID PRN Hobson, Fran E, NP       And   LORazepam  (ATIVAN ) injection 2 mg  2 mg Intramuscular TID PRN Hobson, Fran E, NP       [START ON 03/19/2024] FLUoxetine  (PROZAC ) capsule 20 mg  20 mg Oral Daily Wilber Fini C, NP       magnesium  hydroxide (MILK OF MAGNESIA) suspension 30 mL  30 mL Oral Daily PRN Hobson, Fran E, NP       Current Outpatient Medications  Medication Sig Dispense Refill   FLUoxetine  (PROZAC ) 20 MG capsule Take 1 capsule (20 mg total) by mouth daily. 30 capsule 0    medroxyPROGESTERone (DEPO-PROVERA) 150 MG/ML injection Inject 150 mg into the muscle every 3 (three) months.      Labs  Lab Results:  Admission on 03/15/2024, Discharged on 03/15/2024  Component Date Value Ref Range Status   WBC 03/15/2024 6.6  4.0 - 10.5 K/uL Final   RBC 03/15/2024 4.54  3.87 - 5.11 MIL/uL Final   Hemoglobin 03/15/2024 13.7  12.0 - 15.0 g/dL Final   HCT 90/69/7974 42.0  36.0 - 46.0 % Final   MCV 03/15/2024 92.5  80.0 - 100.0 fL Final   MCH 03/15/2024 30.2  26.0 - 34.0 pg Final   MCHC 03/15/2024 32.6  30.0 - 36.0 g/dL Final   RDW 90/69/7974 15.0  11.5 - 15.5 % Final   Platelets 03/15/2024 433 (H)  150 - 400 K/uL Final   nRBC 03/15/2024 0.0  0.0 - 0.2 % Final   Neutrophils Relative % 03/15/2024 58  % Final   Neutro Abs 03/15/2024 3.9  1.7 - 7.7 K/uL Final   Lymphocytes Relative 03/15/2024 29  % Final   Lymphs Abs 03/15/2024 1.9  0.7 - 4.0 K/uL Final   Monocytes Relative 03/15/2024 11  % Final   Monocytes Absolute 03/15/2024 0.7  0.1 - 1.0 K/uL Final   Eosinophils Relative 03/15/2024 1  % Final   Eosinophils Absolute 03/15/2024 0.1  0.0 - 0.5 K/uL Final   Basophils Relative 03/15/2024 1  % Final   Basophils Absolute 03/15/2024 0.1  0.0 - 0.1 K/uL Final   Immature Granulocytes 03/15/2024 0  % Final   Abs Immature Granulocytes 03/15/2024 0.02  0.00 - 0.07 K/uL Final   Performed at [facility], [address] [place], [place] [address]   Sodium 03/15/2024 139  135 - 145 mmol/L Final   Potassium 03/15/2024 4.1  3.5 - 5.1 mmol/L Final   Chloride 03/15/2024 108  98 - 111 mmol/L Final   CO2 03/15/2024 23  22 - 32 mmol/L Final   Glucose, Bld 03/15/2024 79  70 - 99 mg/dL Final   Glucose reference range applies only to samples taken after fasting for at least 8 hours.   BUN 03/15/2024 8  6 - 20 mg/dL Final   Creatinine, Ser 03/15/2024 0.64  0.44 - 1.00 mg/dL Final   Calcium 90/69/7974 9.0  8.9 - 10.3 mg/dL Final   Total Protein 90/69/7974 7.3  6.5 - 8.1 g/dL Final    Albumin 90/69/7974 3.7  3.5 - 5.0 g/dL Final   AST 90/69/7974 13 (L)  15 - 41 U/L Final   ALT 03/15/2024 10  0 - 44 U/L Final   Alkaline Phosphatase 03/15/2024 54  38 -  126 U/L Final   Total Bilirubin 03/15/2024 0.9  0.0 - 1.2 mg/dL Final   GFR, Estimated 03/15/2024 >60  >60 mL/min Final   Comment: (NOTE) Calculated using the CKD-EPI Creatinine Equation (2021)    Anion gap 03/15/2024 8  5 - 15 Final   Performed at [facility], [address], [place], [place] [address]   Alcohol, Ethyl (B) 03/15/2024 <15  <15 mg/dL Final   Comment: (NOTE) For medical purposes only. Performed at [facility], [address], [place], [place] [address]    Preg Test, Ur 03/15/2024 Negative  Negative Final   POC Amphetamine UR 03/15/2024 None Detected  NONE DETECTED (Cut Off Level 1000 ng/mL) Final   POC Secobarbital (BAR) 03/15/2024 None Detected  NONE DETECTED (Cut Off Level 300 ng/mL) Final   POC Buprenorphine (BUP) 03/15/2024 None Detected  NONE DETECTED (Cut Off Level 10 ng/mL) Final   POC Oxazepam (BZO) 03/15/2024 None Detected  NONE DETECTED (Cut Off Level 300 ng/mL) Final   POC Cocaine UR 03/15/2024 Positive (A)  NONE DETECTED (Cut Off Level 300 ng/mL) Final   POC Methamphetamine UR 03/15/2024 None Detected  NONE DETECTED (Cut Off Level 1000 ng/mL) Final   POC Morphine  03/15/2024 None Detected  NONE DETECTED (Cut Off Level 300 ng/mL) Final   POC Methadone UR 03/15/2024 None Detected  NONE DETECTED (Cut Off Level 300 ng/mL) Final   POC Oxycodone  UR 03/15/2024 None Detected  NONE DETECTED (Cut Off Level 100 ng/mL) Final   POC Marijuana UR 03/15/2024 Positive (A)  NONE DETECTED (Cut Off Level 50 ng/mL) Final   Preg Test, Ur 03/15/2024 NEGATIVE  NEGATIVE Final   Comment:        THE SENSITIVITY OF THIS METHODOLOGY IS >20 mIU/mL.   Admission on 02/16/2024, Discharged on 02/17/2024  Component Date Value Ref Range Status   Sodium 02/16/2024 141  135 - 145 mmol/L Final   Potassium  02/16/2024 3.8  3.5 - 5.1 mmol/L Final   Chloride 02/16/2024 108  98 - 111 mmol/L Final   CO2 02/16/2024 24  22 - 32 mmol/L Final   Glucose, Bld 02/16/2024 148 (H)  70 - 99 mg/dL Final   Glucose reference range applies only to samples taken after fasting for at least 8 hours.   BUN 02/16/2024 6  6 - 20 mg/dL Final   Creatinine, Ser 02/16/2024 0.84  0.44 - 1.00 mg/dL Final   Calcium 90/97/7974 9.2  8.9 - 10.3 mg/dL Final   Total Protein 90/97/7974 7.0  6.5 - 8.1 g/dL Final   Albumin 90/97/7974 3.7  3.5 - 5.0 g/dL Final   AST 90/97/7974 15  15 - 41 U/L Final   ALT 02/16/2024 9  0 - 44 U/L Final   Alkaline Phosphatase 02/16/2024 52  38 - 126 U/L Final   Total Bilirubin 02/16/2024 0.8  0.0 - 1.2 mg/dL Final   GFR, Estimated 02/16/2024 >60  >60 mL/min Final   Comment: (NOTE) Calculated using the CKD-EPI Creatinine Equation (2021)    Anion gap 02/16/2024 9  5 - 15 Final   Performed at [facility], [address], [place], [place] [address]   Alcohol, Ethyl (B) 02/16/2024 <15  <15 mg/dL Final   Comment: (NOTE) For medical purposes only. Performed at [facility], [address], [place], [place] [address]    WBC 02/16/2024 6.5  4.0 - 10.5 K/uL Final   RBC 02/16/2024 4.59  3.87 - 5.11 MIL/uL Final  Hemoglobin 02/16/2024 13.9  12.0 - 15.0 g/dL Final   HCT 90/97/7974 43.4  36.0 - 46.0 % Final   MCV 02/16/2024 94.6  80.0 - 100.0 fL Final   MCH 02/16/2024 30.3  26.0 - 34.0 pg Final   MCHC 02/16/2024 32.0  30.0 - 36.0 g/dL Final   RDW 90/97/7974 14.7  11.5 - 15.5 % Final   Platelets 02/16/2024 446 (H)  150 - 400 K/uL Final   nRBC 02/16/2024 0.0  0.0 - 0.2 % Final   Performed at [facility], [address] [place], [place] [address]   Opiates 02/16/2024 NONE DETECTED  NONE DETECTED Final   Cocaine 02/16/2024 POSITIVE (A)  NONE DETECTED Final   Benzodiazepines 02/16/2024 NONE DETECTED  NONE DETECTED Final   Amphetamines 02/16/2024 NONE DETECTED  NONE DETECTED Final    Tetrahydrocannabinol 02/16/2024 NONE DETECTED  NONE DETECTED Final   Barbiturates 02/16/2024 NONE DETECTED  NONE DETECTED Final   Comment: (NOTE) DRUG SCREEN FOR MEDICAL PURPOSES ONLY.  IF CONFIRMATION IS NEEDED FOR ANY PURPOSE, NOTIFY LAB WITHIN 5 DAYS.  LOWEST DETECTABLE LIMITS FOR URINE DRUG SCREEN Drug Class                     Cutoff (ng/mL) Amphetamine and metabolites    1000 Barbiturate and metabolites    200 Benzodiazepine                 200 Opiates and metabolites        300 Cocaine and metabolites        300 THC                            50 Performed at [facility], [address], [place], [place] [address]    Preg, Serum 02/16/2024 NEGATIVE  NEGATIVE Final   Comment:        THE SENSITIVITY OF THIS METHODOLOGY IS >10 mIU/mL. Performed at [facility], [address] [place], [place] [address]    Salicylate Lvl 02/16/2024 <7.0 (L)  7.0 - 30.0 mg/dL Final   Performed at [facility], [address], [place], [place] [address]   Acetaminophen  (Tylenol ), Serum 02/16/2024 <10 (L)  10 - 30 ug/mL Final   Comment: (NOTE) Therapeutic concentrations vary significantly. A range of 10-30 ug/mL  may be an effective concentration for many patients. However, some  are best treated at concentrations outside of this range. Acetaminophen  concentrations >150 ug/mL at 4 hours after ingestion  and >50 ug/mL at 12 hours after ingestion are often associated with  toxic reactions.  Performed at [facility], [address] [place], [place] [address]    Sodium 02/16/2024 143  135 - 145 mmol/L Final   Potassium 02/16/2024 3.8  3.5 - 5.1 mmol/L Final   Chloride 02/16/2024 109  98 - 111 mmol/L Final   BUN 02/16/2024 7  6 - 20 mg/dL Final   Creatinine, Ser 02/16/2024 0.80  0.44 - 1.00 mg/dL Final   Glucose, Bld 90/97/7974 145 (H)  70 - 99 mg/dL Final   Glucose reference range applies only to samples taken after fasting for at least 8 hours.   Calcium, Ion  02/16/2024 1.13 (L)  1.15 - 1.40 mmol/L Final   TCO2 02/16/2024 21 (L)  22 - 32 mmol/L Final   Hemoglobin 02/16/2024 14.6  12.0 - 15.0 g/dL Final   HCT 90/97/7974 43.0  36.0 - 46.0 % Final   Acetaminophen  (Tylenol ), Serum 02/16/2024 <10 (L)  10 - 30 ug/mL Final   Comment: (NOTE) Therapeutic concentrations vary significantly. A range of 10-30 ug/mL  may be an effective concentration for many patients. However, some  are best treated at concentrations outside of this range. Acetaminophen  concentrations >150 ug/mL at 4 hours after ingestion  and >50 ug/mL at 12 hours after ingestion are often associated with  toxic reactions.  Performed at [facility], [address], [place], [place] [address]     Blood Alcohol level:  Lab Results  Component Value Date   [facility] <15 03/15/2024   ETH <15 02/16/2024    Metabolic Disorder Labs: Lab Results  Component Value Date   HGBA1C 6.1 (H) 11/17/2022   MPG 128 11/17/2022   MPG 116.89 05/02/2022   Lab Results  Component Value Date   PROLACTIN 13.7 11/17/2022   PROLACTIN 5.0 05/02/2022   Lab Results  Component Value Date   CHOL 229 (H) 11/17/2022   TRIG 113 11/17/2022   HDL 37 (L) 11/17/2022   CHOLHDL 6.2 11/17/2022   VLDL 23 11/17/2022   LDLCALC 169 (H) 11/17/2022   LDLCALC 154 (H) 05/02/2022    Therapeutic Lab Levels: No results found for: LITHIUM  No results found for: VALPROATE No results found for: CBMZ  Physical Findings   AUDIT    Flowsheet Row [facility] Ad[facility] (Discharged) [facility] Admission (Discharged) [facility]  Alcohol Use Disorder Identification Test Final Score (AUDIT) 0 2 9   PHQ2-9    Flowsheet Row ED from 03/15/2024 in [facility]  PHQ-2 Total Score 2  PHQ-9 Total Score 3   Flowsheet  Row ED from 03/15/2024 in [facility] Most recent reading at 03/17/2024 11:25 PM ED from 03/15/2024 in [facility] Most recent reading at 03/15/2024 10:44 AM Ad[facility] (Discharged[facility] Most recent reading at 02/17/2024  4:00 PM  C-SSRS RISK CATEGORY Low Risk High Risk High Risk     Musculoskeletal  Strength & Muscle Tone: within normal limits Gait & Station: normal Patient leans: N/A  Psychiatric Specialty Exam  Presentation  General Appearance:  Appropriate for Environment; Casual  Eye Contact: Good  Speech: Clear and Coherent; Normal Rate  Speech Volume: Normal  Handedness: Right   Mood and Affect  Mood: Depressed; Hopeless  Affect: Congruent   Thought Process  Thought Processes: Coherent  Descriptions of Associations:Intact  Orientation:Full (Time, Place and Person)  Thought Content:Logical  Diagnosis of Schizophrenia or Schizoaffective disorder in past: No data recorded   Hallucinations:No data recorded Ideas of Reference:None  Suicidal Thoughts:No data recorded Homicidal Thoughts:No data recorded  Sensorium  Memory: Recent Good; Immediate Good  Judgment: Fair  Insight: Fair   Art therapist  Concentration: Fair  Attention Span: Fair  Recall: Good  Fund of Knowledge: Good  Language: Good   Psychomotor Activity  Psychomotor Activity: Normal   Assets  Assets: Communication Skills; Desire for Improvement; Housing; Vocational/Educational; Resilience   Sleep  Sleep:No data recorded Estimated Sleeping Duration (Last 24 Hours): 9.00-11.00 hours  No data recorded  Physical Exam  Physical Exam Vitals and nursing note reviewed.  Constitutional:      General: She is not in acute distress.    Appearance: Normal appearance.  She is not ill-appearing.  HENT:     Head: Normocephalic and atraumatic.     Nose:  Nose normal.  Eyes:     Extraocular Movements: Extraocular movements intact.     Conjunctiva/sclera: Conjunctivae normal.     Pupils: Pupils are equal, round, and reactive to light.  Cardiovascular:     Rate and Rhythm: Normal rate and regular rhythm.  Pulmonary:     Effort: Pulmonary effort is normal.     Breath sounds: Normal breath sounds.  Musculoskeletal:        General: Normal range of motion.     Cervical back: Normal range of motion and neck supple.  Skin:    General: Skin is warm and dry.  Neurological:     General: No focal deficit present.     Mental Status: She is alert and oriented to person, place, and time.     Review of Systems  Constitutional: Negative.   HENT: Negative.    Eyes: Negative.   Respiratory: Negative.    Cardiovascular: Negative.   Gastrointestinal: Negative.   Genitourinary:  Positive for frequency and urgency.  Musculoskeletal: Negative.   Neurological: Negative.   Endo/Heme/Allergies: Negative.   Psychiatric/Behavioral:  Positive for depression, substance abuse and suicidal ideas.    Blood pressure 112/87, pulse 98, temperature 98.5 F (36.9 C), temperature source Oral, resp. rate 18, SpO2 98%. There is no height or weight on file to calculate BMI.  Treatment Plan Summary: - Plan to discharge to mother and sister tomorrow with plans for family to take her to [facility] on [facility]24 for residential treatment.  - Pt is encouraged to engage in groups and milieu activities and limit isolating in room.  - Increased Prozac  from 10 mg to 20mg  starting tomorrow, for ongoing depressive symptoms.  Patient recently discharged from inpatient hospital on this medication and reports that it had been effective but that she was noncompliant once discharged. Pt reminded of importance of medication compliance and to allow time for medication to reach therapeutic levels.  - Pt reminded to provide urine sample for urinalysis  - 7 day sample of medications already at  RN station  - 30 day script also at RN station        Alan JAYSON Mcardle, NP 03/18/2024 10:13 AM

## 2024-03-18 NOTE — Group Note (Signed)
 Group Topic: Decisional Balance/Substance Abuse  Group Date: 03/18/2024 Start Time: 1230 End Time: 1310 Facilitators: Stanly Stabile, RN  Department: [facility]  Number of Participants: 10  Group Focus: chemical dependency issues Treatment Modality:  Psychoeducation Interventions utilized were clarification, exploration, leisure development, and patient education Purpose: explore maladaptive thinking, increase insight, and regain self-worth  Name: Penny Berger Date of Birth: 08-Apr-1976  MR: [id number]    Level of Participation: moderate Quality of Participation: cooperative Interactions with others: gave feedback Mood/Affect: appropriate Triggers (if applicable):   Cognition: goal directed Progress: Significant Response:   Plan: follow-up needed  Patients Problems:  Patient Active Problem List   Diagnosis Date Noted   MDD (major depressive disorder), recurrent episode, moderate (HCC) 03/15/2024   Intentional overdose (HCC) 02/17/2024   MDD (major depressive disorder), severe (HCC) 02/17/2024   Suicidal thoughts 11/17/2022   Severe episode of recurrent major depressive disorder, without psychotic features (HCC) 05/03/2022   Suicidal behavior with attempted self-injury (HCC) 05/03/2022   Cocaine use disorder (HCC) 05/03/2022   Alcohol use disorder, moderate, dependence (HCC) 05/03/2022   Marijuana smoker, continuous 05/03/2022

## 2024-03-18 NOTE — ED Notes (Signed)
 Patient is in the bedroom calm and composed. NAD.  Environment secured per policy. Will keep monitoring for safety.

## 2024-03-18 NOTE — ED Notes (Signed)
 Calm and cooperative with care.  Patient without distress or complaint.  No distress.  Patient ate breakfast and is now attending [facility] group in day room.

## 2024-03-18 NOTE — Care Management (Signed)
 [facility] Care Management  Writer met with the patient and discussed discharge planning.  Patient requests inpatient substance abuse treatment.    Declined at [facility] due to insurance,  Patient was accepted to [facility] on Monday 03-21-2024.  Patient will be discharged from the [facility] on Saturday 03-19-2024.  Her mother will be picking up the patient on Saturday at 11am.    Writer left a HIPPA compliant voice mail message for the patient mother Garrison and number is [phone]

## 2024-03-18 NOTE — ED Notes (Signed)
 Pt is sleeping. Respirations are even and labored. Q15 safety checks in place.

## 2024-03-18 NOTE — Group Note (Signed)
 Group Topic: Communication  Group Date: 03/18/2024 Start Time: 0800 End Time: 0830 Facilitators: Judi Monico RAMAN, NT  Department: [facility]  Number of Participants: 11  Group Focus: community group Treatment Modality:  Psychoeducation Interventions utilized were clarification, orientation, and support Purpose: improve communication skills and increase insight  Name: Penny Berger Date of Birth: 1975/09/15  MR: [id number]    Level of Participation: active Quality of Participation: attentive Interactions with others: gave feedback Mood/Affect: appropriate Triggers (if applicable): n/a Cognition: coherent/clear Progress: Gaining insight Response: Pt expressed understanding of unit expectations and expressed any needs. Plan: patient will be encouraged to attend future groups  Patients Problems:  Patient Active Problem List   Diagnosis Date Noted   MDD (major depressive disorder), recurrent episode, moderate (HCC) 03/15/2024   Intentional overdose (HCC) 02/17/2024   MDD (major depressive disorder), severe (HCC) 02/17/2024   Suicidal thoughts 11/17/2022   Severe episode of recurrent major depressive disorder, without psychotic features (HCC) 05/03/2022   Suicidal behavior with attempted self-injury (HCC) 05/03/2022   Cocaine use disorder (HCC) 05/03/2022   Alcohol use disorder, moderate, dependence (HCC) 05/03/2022   Marijuana smoker, continuous 05/03/2022

## 2024-03-18 NOTE — Group Note (Signed)
 Group Topic: Recovery Basics  Group Date: 03/18/2024 Start Time: 1000 End Time: 1030 Facilitators: Judi Monico RAMAN, NT  Department: [facility]  Number of Participants: 10  Group Focus: other Peer Support Treatment Modality:  Psychoeducation Interventions utilized were support Purpose: enhance coping skills, express feelings, express irrational fears, and increase insight  Name: Penny Berger Date of Birth: 03/03/1976  MR: [id number]    Level of Participation: active Quality of Participation: attentive Interactions with others: gave feedback Mood/Affect: appropriate Triggers (if applicable): n/a Cognition: coherent/clear Progress: Gaining insight Response: [facility] Plan: patient will be encouraged to attend future groups  Patients Problems:  Patient Active Problem List   Diagnosis Date Noted   MDD (major depressive disorder), recurrent episode, moderate (HCC) 03/15/2024   Intentional overdose (HCC) 02/17/2024   MDD (major depressive disorder), severe (HCC) 02/17/2024   Suicidal thoughts 11/17/2022   Severe episode of recurrent major depressive disorder, without psychotic features (HCC) 05/03/2022   Suicidal behavior with attempted self-injury (HCC) 05/03/2022   Cocaine use disorder (HCC) 05/03/2022   Alcohol use disorder, moderate, dependence (HCC) 05/03/2022   Marijuana smoker, continuous 05/03/2022

## 2024-03-19 DIAGNOSIS — R45851 Suicidal ideations: Secondary | ICD-10-CM | POA: Diagnosis not present

## 2024-03-19 DIAGNOSIS — F1410 Cocaine abuse, uncomplicated: Secondary | ICD-10-CM | POA: Diagnosis not present

## 2024-03-19 DIAGNOSIS — F331 Major depressive disorder, recurrent, moderate: Secondary | ICD-10-CM | POA: Diagnosis not present

## 2024-03-19 NOTE — ED Notes (Signed)
 Patient A&O x 4, ambulatory. Patient discharged in no acute distress. Patient denied SI/HI, A/VH upon discharge. Patient verbalized understanding of all discharge instructions reviewed on AVS via staff, to include follow up appointments, RX's and safety. Suicide safety plan completed and reviewed with Clinical research associate. A copy given to pt. Patient reported mood 10/10.  Pt belongings returned to patient from locker intact. Patient escorted to lobby via staff for transport to home via her mother. Safety maintained.

## 2024-03-19 NOTE — ED Provider Notes (Signed)
 [facility]BC/OBS ASAP Discharge Summary  Date and Time: 03/19/2024 8:29 AM  Name: Penny Berger  MRN:  [id number]   Discharge Diagnoses:  Final diagnoses:  Cocaine abuse with cocaine-induced mood disorder (HCC)  Persistent mood (affective) disorder, unspecified  Suicidal thoughts    Subjective: Penny Berger is a 48 year old African [place] female that was seen and evaluated face to face by this provider at discharge. Reports passive suicidal ideations.  I don't want to hurt myself but I don't wont to be here either.  Denied plan or intent at discharge.  Reports plans to follow-up with [facility] for residential treatment.  States her mother will be to pick her up at discharge.  She denies that she is followed by therapy or psychiatry services at this time.  Discussed following up with [facility] urgent care for throughput services after discharge she was amendable to plan.  Stay Summary: Koral Thaden was admitted for MDD (major depressive disorder), recurrent episode, moderate (HCC) and crisis management.  She was treated with the following medications fluoxetine  10 mg.  Chabely Norby was discharged with current medication and was instructed on how to take medications as prescribed; (details listed below under Medication List).  Medical problems were identified and treated as needed.  Home medications were restarted as appropriate.  Improvement was monitored by observation and [name] daily report of symptom reduction.  Emotional and mental status was monitored by daily self-inventory reports completed by [name] and clinical staff.         Trenda Corliss was evaluated by the treatment team for stability and plans for continued recovery upon discharge.  Makilah Dowda motivation was an integral factor for scheduling further treatment.  Employment, transportation, bed availability, health status, family support, and any pending legal issues were also considered during her hospital  stay. She was offered further treatment options upon discharge including but not limited to Residential, Intensive Outpatient, and Outpatient treatment.  Nixie Laube will follow up with the services as listed below under Follow Up Information.     Upon completion of this admission the Tiffiany Beadles was both mentally and medically stable for discharge denying suicidal/homicidal ideation, auditory/visual/tactile hallucinations, delusional thoughts and paranoia.      Total Time spent with patient: 20 minutes  Copied from chart; Past Psychiatric History: MDD Hospitalizations: [facility] (June , 2024, Sept, 2025), [facility] (04/2022) Medication trials: Prozac , Abilify , Wellbutrin, Olanzapine , Trazodone , Effexor   Past Medical History: none reported Family History: none reported  Family Psychiatric History: Sister:depression; Father: substance use Social History: works full-time as a Lawyer; lives with parents  Tobacco Cessation:  N/A, patient does not currently use tobacco products  Current Medications:  Current Facility-Administered Medications  Medication Dose Route Frequency Provider Last Rate Last Admin   acetaminophen  (TYLENOL ) tablet 650 mg  650 mg Oral Q6H PRN Hobson, Fran E, NP       alum & mag hydroxide-simeth (MAALOX/MYLANTA) 200-200-20 MG/5ML suspension 30 mL  30 mL Oral Q4H PRN Hobson, Fran E, NP       haloperidol  (HALDOL ) tablet 5 mg  5 mg Oral TID PRN Hobson, Fran E, NP       And   diphenhydrAMINE  (BENADRYL ) capsule 50 mg  50 mg Oral TID PRN Hobson, Fran E, NP       haloperidol  lactate (HALDOL ) injection 5 mg  5 mg Intramuscular TID PRN Hobson, Fran E, NP       And   diphenhydrAMINE  (BENADRYL ) injection 50 mg  50 mg Intramuscular TID  PRN Hobson, Fran E, NP       And   LORazepam  (ATIVAN ) injection 2 mg  2 mg Intramuscular TID PRN Hobson, Fran E, NP       haloperidol  lactate (HALDOL ) injection 10 mg  10 mg Intramuscular TID PRN Hobson, Fran E, NP       And   diphenhydrAMINE  (BENADRYL )  injection 50 mg  50 mg Intramuscular TID PRN Hobson, Fran E, NP       And   LORazepam  (ATIVAN ) injection 2 mg  2 mg Intramuscular TID PRN Hobson, Fran E, NP       FLUoxetine  (PROZAC ) capsule 20 mg  20 mg Oral Daily Brent, Amanda C, NP       magnesium  hydroxide (MILK OF MAGNESIA) suspension 30 mL  30 mL Oral Daily PRN Hobson, Fran E, NP       Current Outpatient Medications  Medication Sig Dispense Refill   FLUoxetine  (PROZAC ) 20 MG capsule Take 1 capsule (20 mg total) by mouth daily. 30 capsule 0   medroxyPROGESTERone (DEPO-PROVERA) 150 MG/ML injection Inject 150 mg into the muscle every 3 (three) months.      PTA Medications:  Facility Ordered Medications  Medication   acetaminophen  (TYLENOL ) tablet 650 mg   alum & mag hydroxide-simeth (MAALOX/MYLANTA) 200-200-20 MG/5ML suspension 30 mL   magnesium  hydroxide (MILK OF MAGNESIA) suspension 30 mL   haloperidol  (HALDOL ) tablet 5 mg   And   diphenhydrAMINE  (BENADRYL ) capsule 50 mg   haloperidol  lactate (HALDOL ) injection 5 mg   And   diphenhydrAMINE  (BENADRYL ) injection 50 mg   And   LORazepam  (ATIVAN ) injection 2 mg   haloperidol  lactate (HALDOL ) injection 10 mg   And   diphenhydrAMINE  (BENADRYL ) injection 50 mg   And   LORazepam  (ATIVAN ) injection 2 mg   FLUoxetine  (PROZAC ) capsule 20 mg   PTA Medications  Medication Sig   medroxyPROGESTERone (DEPO-PROVERA) 150 MG/ML injection Inject 150 mg into the muscle every 3 (three) months.   FLUoxetine  (PROZAC ) 20 MG capsule Take 1 capsule (20 mg total) by mouth daily.       03/18/2024    8:59 AM 03/17/2024   12:24 PM  Depression screen PHQ 2/9  Decreased Interest 1 1  Down, Depressed, Hopeless 1 1  PHQ - 2 Score 2 2  Altered sleeping 0 0  Tired, decreased energy 1 1  Change in appetite 0 0  Feeling bad or failure about yourself  0 1  Trouble concentrating 0 0  Moving slowly or fidgety/restless 0 0  Suicidal thoughts 0 1  PHQ-9 Score 3 5  Difficult doing work/chores Somewhat  difficult Somewhat difficult    Flowsheet Row ED from 03/15/2024 in [facility] Most recent reading at 03/17/2024 11:25 PM ED from 03/15/2024 in [facility] Most recent reading at 03/15/2024 10:44 AM Ad[facility] (Discharged) [facility] Most recent reading at 02/17/2024  4:00 PM  C-SSRS RISK CATEGORY Low Risk High Risk High Risk    Musculoskeletal  Strength & Muscle Tone: within normal limits Gait & Station: normal Patient leans: N/A  Psychiatric Specialty Exam  Presentation  General Appearance:  Appropriate for Environment; Casual  Eye Contact: Good  Speech: Clear and Coherent; Normal Rate  Speech Volume: Normal  Handedness: Right   Mood and Affect  Mood: Depressed; Hopeless  Affect: Congruent   Thought Process  Thought Processes: Coherent  Descriptions of Associations:Intact  Orientation:Full (Time, Place and  Person)  Thought Content:Logical  Diagnosis of Schizophrenia or Schizoaffective disorder in past: No data recorded   Hallucinations:No data recorded Ideas of Reference:None  Suicidal Thoughts:No data recorded Homicidal Thoughts:No data recorded  Sensorium  Memory: Recent Good; Immediate Good  Judgment: Fair  Insight: Fair   Art therapist  Concentration: Fair  Attention Span: Fair  Recall: Good  Fund of Knowledge: Good  Language: Good   Psychomotor Activity  Psychomotor Activity:No data recorded  Assets  Assets: Communication Skills; Desire for Improvement; Housing; Vocational/Educational; Resilience   Sleep  Sleep:No data recorded Estimated Sleeping Duration (Last 24 Hours): 11.50-13.50 hours  No data recorded  Physical Exam  Physical Exam Vitals and nursing note reviewed.  Constitutional:      Appearance: Normal appearance.  Neurological:     Mental Status: She is alert and oriented to person,  place, and time.  Psychiatric:        Mood and Affect: Mood normal.        Behavior: Behavior normal.    ROS Blood pressure 133/86, pulse 84, temperature 98.9 F (37.2 C), temperature source Oral, resp. rate 16, SpO2 98%. There is no height or weight on file to calculate BMI.  Demographic Factors:  Low socioeconomic status  Loss Factors: Financial problems/change in socioeconomic status  Historical Factors: Family history of mental illness or substance abuse  Risk Reduction Factors:   Living with another person, especially a relative and Positive social support  Continued Clinical Symptoms:  Depression:   Comorbid alcohol abuse/dependence Alcohol/Substance Abuse/Dependencies  Cognitive Features That Contribute To Risk:  Closed-mindedness    Suicide Risk:  Minimal: No identifiable suicidal ideation.  Patients presenting with no risk factors but with morbid ruminations; may be classified as minimal risk based on the severity of the depressive symptoms  Plan Of Care/Follow-up recommendations:  Activity:  as tolerated  Diet:  heart healthy  Disposition: Take all of you medications as prescribed by your mental healthcare provider.  Report any adverse effects and reactions from your medications to your outpatient provider promptly.  Do not engage in alcohol and or illegal drug use while on prescription medicines. Keep all scheduled appointments. This is to ensure that you are getting refills on time and to avoid any interruption in your medication.  If you are unable to keep an appointment call to reschedule.  Be sure to follow up with resources and follow ups given. In the event of worsening symptoms call the crisis hotline, 911, and or go to the nearest emergency department for appropriate evaluation and treatment of symptoms. Follow-up with your primary care provider for your medical issues, concerns and or health care needs.    Staci LOISE Kerns, NP 03/19/2024, 8:29 AM

## 2024-03-19 NOTE — ED Notes (Signed)
 Pt Asleep at this time. NAD

## 2024-03-19 NOTE — ED Provider Notes (Signed)
 [facility] Discharge Suicide Risk Assessment   Principal Problem: MDD (major depressive disorder), recurrent episode, moderate (HCC) Discharge Diagnoses: Principal Problem:   MDD (major depressive disorder), recurrent episode, moderate (HCC)   Total Time spent with patient: 15 minutes  Evaluation at discharge:-Penny Berger is a 48 year old African [place] female that was seen and evaluated face to face by this provider at discharge. Reports passive suicidal ideations.  I don't want to hurt myself but I don't wont to be here either.  Denied plan or intent at discharge.  Reports plans to follow-up with [facility] for residential treatment.  States her mother will be to pick her up at discharge.  She denies that she is followed by therapy or psychiatry services at this time.  Discussed following up with [facility] urgent care for throughput services after discharge she was amendable to plan.  Musculoskeletal: Strength & Muscle Tone: within normal limits Gait & Station: normal Patient leans: N/A  Psychiatric Specialty Exam  Presentation  General Appearance:  Appropriate for Environment; Casual  Eye Contact: Good  Speech: Clear and Coherent; Normal Rate  Speech Volume: Normal  Handedness: Right   Mood and Affect  Mood: Depressed; Hopeless  Duration of Depression Symptoms: Greater than two weeks  Affect: Congruent   Thought Process  Thought Processes: Coherent  Descriptions of Associations:Intact  Orientation:Full (Time, Place and Person)  Thought Content:Logical  History of Schizophrenia/Schizoaffective disorder:No data recorded Duration of Psychotic Symptoms:No data recorded Hallucinations:No data recorded Ideas of Reference:None  Suicidal Thoughts:No data recorded Homicidal Thoughts:No data recorded  Sensorium  Memory: Recent Good; Immediate Good  Judgment: Fair  Insight: Fair   Art therapist  Concentration: Fair  Attention  Span: Fair  Recall: Good  Fund of Knowledge: Good  Language: Good   Psychomotor Activity  Psychomotor Activity:No data recorded  Assets  Assets: Communication Skills; Desire for Improvement; Housing; Vocational/Educational; Resilience   Sleep  Sleep:No data recorded Estimated Sleeping Duration (Last 24 Hours): 10.50-12.50 hours  Physical Exam: Physical Exam Vitals and nursing note reviewed.  Neurological:     Mental Status: She is alert and oriented to person, place, and time.  Psychiatric:        Mood and Affect: Mood normal.        Behavior: Behavior normal.        Thought Content: Thought content normal.    Review of Systems  Psychiatric/Behavioral:  Suicidal ideas: passive ideations. The patient is nervous/anxious.   All other systems reviewed and are negative.  Blood pressure 122/85, pulse 74, temperature 98.4 F (36.9 C), temperature source Oral, resp. rate 18, SpO2 98%. There is no height or weight on file to calculate BMI.  Mental Status Per Nursing Assessment::   On Admission:  NA  Demographic Factors:  Low socioeconomic status  Loss Factors: Financial problems/change in socioeconomic status  Historical Factors: Family history of mental illness or substance abuse  Risk Reduction Factors:   Positive social support and Positive coping skills or problem solving skills  Continued Clinical Symptoms:  Depression:   Comorbid alcohol abuse/dependence Alcohol/Substance Abuse/Dependencies  Cognitive Features That Contribute To Risk:  Closed-mindedness    Suicide Risk:  Minimal: No identifiable suicidal ideation.  Patients presenting with no risk factors but with morbid ruminations; may be classified as minimal risk based on the severity of the depressive symptoms    Plan Of Care/Follow-up recommendations:  Activity:  as tolerated  Diet:  Heart healthy   Staci LOISE Kerns, NP 03/19/2024, 10:14 AM

## 2024-03-19 NOTE — ED Notes (Signed)
Patient A&Ox4. Denies intent to harm self/others when asked. Denies A/VH. Patient denies any physical complaints when asked. No acute distress noted. Routine safety checks conducted according to facility protocol. Encouraged patient to notify staff if thoughts of harm toward self or others arise. Patient verbalize understanding and agreement. Will continue to monitor for safety.    

## 2024-03-21 ENCOUNTER — Telehealth (HOSPITAL_COMMUNITY): Payer: Self-pay | Admitting: Licensed Clinical Social Worker

## 2024-03-21 NOTE — Telephone Encounter (Signed)
 The therapist returns Penny Berger's call leaving a HIPAA-compliant voicemail with his direct contact number.  Zell Maier, MA, LCSW, [facility], LCAS 03/21/2024

## 2024-03-21 NOTE — Telephone Encounter (Signed)
 The therapist receives a call from [place] confirming her identity via two identifiers. The therapist gives her an overview of the [facility] clarifying that the program does not meet daily as her voicemail seemed to suggest that she believes and that the 9 a.m. to 12 p.m. meeting is in a group and not on an individual basis but individual therapy is available as well.  Ryen says that her goal is to go to inpatient residential treatment to get away from her environment and to go on FMLA taking a complete leave from her job. She says that she went to a treatment center in [place]  before but it did not work as she was there because people wanted her there; however, she now feels the need to do something herself.   She says that she was told that she would come to [facility] for intensive therapy for three hours a day to get mental stability after which she can go to [facility].   The therapist explains that people in [facility] are typically in the program from 6-8 weeks and that if people progress as expected in [facility] that a higher level of care such as [facility] would not be needed. Additionally, the therapist explains that the [facility] can complete FMLA paperwork such that people can be on a complete leave from work while attending the program.   Alaze sounds agitated in that her speech is somewhat pressured and gets a little loud as she talks about being confused indicating that people are not on the same page. As her goal is inpatient residential and she has private insurance, the therapist questions if other inpatient residential programs that are in-network for her insurance were contacted to see if they were dual capable and might be able to take her. Angle says that no other program but [facility] was mentioned and that the person with whom she met kept pushing [facility].  By the conclusion of the call, Najae schedules to come for an assessment on 03/23/24 at 8:30 a.m. She is going to call [facility] to find out  what she needs to do to be deemed mentally stable such that she can enter their program.   Zell Maier, MA, LCSW, [facility], LCAS 03/21/2024

## 2024-03-23 ENCOUNTER — Telehealth (HOSPITAL_COMMUNITY): Payer: Self-pay | Admitting: Licensed Clinical Social Worker

## 2024-03-23 ENCOUNTER — Ambulatory Visit (HOSPITAL_COMMUNITY): Payer: Self-pay

## 2024-03-23 NOTE — Telephone Encounter (Signed)
 The therapist attempts to reach [facility] leaving a HIPAA-compliant voicemail.  Zell Maier, MA, LCSW, [facility], LCAS 03/23/2024
